# Patient Record
Sex: Male | Born: 1980 | Race: White | Hispanic: No | Marital: Single | Smoking: Current every day smoker
Health system: Southern US, Community
[De-identification: ages and names within clinical notes are randomized; demographics above are authoritative.]

## PROBLEM LIST (undated history)

## (undated) DIAGNOSIS — I10 Essential (primary) hypertension: Secondary | ICD-10-CM

## (undated) DIAGNOSIS — D509 Iron deficiency anemia, unspecified: Secondary | ICD-10-CM

## (undated) DIAGNOSIS — K863 Pseudocyst of pancreas: Secondary | ICD-10-CM

## (undated) DIAGNOSIS — K859 Acute pancreatitis without necrosis or infection, unspecified: Secondary | ICD-10-CM

## (undated) DIAGNOSIS — F17200 Nicotine dependence, unspecified, uncomplicated: Secondary | ICD-10-CM

## (undated) HISTORY — DX: Iron deficiency anemia, unspecified: D50.9

## (undated) HISTORY — DX: Nicotine dependence, unspecified, uncomplicated: F17.200

## (undated) HISTORY — DX: Essential (primary) hypertension: I10

## (undated) HISTORY — DX: Pseudocyst of pancreas: K86.3

## (undated) HISTORY — DX: Acute pancreatitis without necrosis or infection, unspecified: K85.90

---
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
## 2001-05-21 ENCOUNTER — Ambulatory Visit (HOSPITAL_BASED_OUTPATIENT_CLINIC_OR_DEPARTMENT_OTHER): Admission: RE | Admit: 2001-05-21 | Discharge: 2001-05-21 | Payer: Self-pay | Admitting: *Deleted

## 2001-08-27 ENCOUNTER — Ambulatory Visit (HOSPITAL_BASED_OUTPATIENT_CLINIC_OR_DEPARTMENT_OTHER): Admission: RE | Admit: 2001-08-27 | Discharge: 2001-08-27 | Payer: Self-pay | Admitting: *Deleted

## 2005-04-04 ENCOUNTER — Emergency Department (HOSPITAL_COMMUNITY): Admission: EM | Admit: 2005-04-04 | Discharge: 2005-04-04 | Payer: Self-pay | Admitting: Emergency Medicine

## 2018-12-19 ENCOUNTER — Encounter (HOSPITAL_COMMUNITY): Payer: Self-pay

## 2018-12-19 ENCOUNTER — Emergency Department (HOSPITAL_COMMUNITY): Payer: 59

## 2018-12-19 ENCOUNTER — Inpatient Hospital Stay (HOSPITAL_COMMUNITY)
Admission: EM | Admit: 2018-12-19 | Discharge: 2018-12-24 | DRG: 131 | Disposition: A | Discharge status: Home / Self Care | Payer: 59 | Attending: Internal Medicine | Admitting: Internal Medicine

## 2018-12-19 ENCOUNTER — Other Ambulatory Visit: Payer: Self-pay

## 2018-12-19 DIAGNOSIS — I160 Hypertensive urgency: Secondary | ICD-10-CM | POA: Diagnosis present

## 2018-12-19 DIAGNOSIS — F10239 Alcohol dependence with withdrawal, unspecified: Secondary | ICD-10-CM

## 2018-12-19 DIAGNOSIS — Z1159 Encounter for screening for other viral diseases: Secondary | ICD-10-CM

## 2018-12-19 DIAGNOSIS — Z720 Tobacco use: Secondary | ICD-10-CM

## 2018-12-19 DIAGNOSIS — I10 Essential (primary) hypertension: Secondary | ICD-10-CM | POA: Diagnosis present

## 2018-12-19 DIAGNOSIS — M272 Inflammatory conditions of jaws: Secondary | ICD-10-CM | POA: Diagnosis not present

## 2018-12-19 DIAGNOSIS — E860 Dehydration: Secondary | ICD-10-CM | POA: Diagnosis present

## 2018-12-19 DIAGNOSIS — S02609A Fracture of mandible, unspecified, initial encounter for closed fracture: Secondary | ICD-10-CM | POA: Diagnosis not present

## 2018-12-19 DIAGNOSIS — S0181XA Laceration without foreign body of other part of head, initial encounter: Secondary | ICD-10-CM | POA: Diagnosis present

## 2018-12-19 DIAGNOSIS — R9431 Abnormal electrocardiogram [ECG] [EKG]: Secondary | ICD-10-CM | POA: Diagnosis present

## 2018-12-19 DIAGNOSIS — F10230 Alcohol dependence with withdrawal, uncomplicated: Secondary | ICD-10-CM

## 2018-12-19 DIAGNOSIS — F10939 Alcohol use, unspecified with withdrawal, unspecified: Secondary | ICD-10-CM

## 2018-12-19 DIAGNOSIS — S02601A Fracture of unspecified part of body of right mandible, initial encounter for closed fracture: Principal | ICD-10-CM | POA: Diagnosis present

## 2018-12-19 DIAGNOSIS — R000 Tachycardia, unspecified: Secondary | ICD-10-CM | POA: Diagnosis not present

## 2018-12-19 DIAGNOSIS — S02652A Fracture of angle of left mandible, initial encounter for closed fracture: Secondary | ICD-10-CM | POA: Diagnosis present

## 2018-12-19 DIAGNOSIS — F10930 Alcohol use, unspecified with withdrawal, uncomplicated: Secondary | ICD-10-CM

## 2018-12-19 DIAGNOSIS — F17210 Nicotine dependence, cigarettes, uncomplicated: Secondary | ICD-10-CM | POA: Diagnosis present

## 2018-12-19 DIAGNOSIS — W109XXA Fall (on) (from) unspecified stairs and steps, initial encounter: Secondary | ICD-10-CM | POA: Diagnosis present

## 2018-12-19 DIAGNOSIS — R0600 Dyspnea, unspecified: Secondary | ICD-10-CM

## 2018-12-19 HISTORY — DX: Alcohol use, unspecified with withdrawal, unspecified: F10.939

## 2018-12-19 LAB — URINALYSIS, ROUTINE W REFLEX MICROSCOPIC
Bilirubin Urine: NEGATIVE
Glucose, UA: NEGATIVE mg/dL
Hgb urine dipstick: NEGATIVE
Ketones, ur: 80 mg/dL — AB
Nitrite: NEGATIVE
Protein, ur: 30 mg/dL — AB
Specific Gravity, Urine: 1.024 (ref 1.005–1.030)
pH: 5 (ref 5.0–8.0)

## 2018-12-19 LAB — COMPREHENSIVE METABOLIC PANEL
ALT: 79 U/L — ABNORMAL HIGH (ref 0–44)
AST: 57 U/L — ABNORMAL HIGH (ref 15–41)
Albumin: 4.3 g/dL (ref 3.5–5.0)
Alkaline Phosphatase: 66 U/L (ref 38–126)
Anion gap: 16 — ABNORMAL HIGH (ref 5–15)
BUN: 6 mg/dL (ref 6–20)
CO2: 20 mmol/L — ABNORMAL LOW (ref 22–32)
Calcium: 9.8 mg/dL (ref 8.9–10.3)
Chloride: 103 mmol/L (ref 98–111)
Creatinine, Ser: 0.98 mg/dL (ref 0.61–1.24)
GFR calc Af Amer: >60 mL/min (ref >60)
GFR calc non Af Amer: >60 mL/min (ref >60)
Glucose, Bld: 132 mg/dL — ABNORMAL HIGH (ref 70–99)
Potassium: 3.6 mmol/L (ref 3.5–5.1)
Sodium: 139 mmol/L (ref 135–145)
Total Bilirubin: 1.5 mg/dL — ABNORMAL HIGH (ref 0.3–1.2)
Total Protein: 7.8 g/dL (ref 6.5–8.1)

## 2018-12-19 LAB — CBC WITH DIFFERENTIAL/PLATELET
Abs Immature Granulocytes: 0 10*3/uL (ref 0.00–0.07)
Basophils Absolute: 0.2 10*3/uL — ABNORMAL HIGH (ref 0.0–0.1)
Basophils Relative: 1 %
Eosinophils Absolute: 0 10*3/uL (ref 0.0–0.5)
Eosinophils Relative: 0 %
HCT: 50.3 % (ref 39.0–52.0)
Hemoglobin: 17.7 g/dL — ABNORMAL HIGH (ref 13.0–17.0)
Lymphocytes Relative: 6 %
Lymphs Abs: 1.1 10*3/uL (ref 0.7–4.0)
MCH: 33.9 pg (ref 26.0–34.0)
MCHC: 35.2 g/dL (ref 30.0–36.0)
MCV: 96.4 fL (ref 80.0–100.0)
Monocytes Absolute: 0.7 10*3/uL (ref 0.1–1.0)
Monocytes Relative: 4 %
Neutro Abs: 16.5 10*3/uL — ABNORMAL HIGH (ref 1.7–7.7)
Neutrophils Relative %: 89 %
Platelets: 203 10*3/uL (ref 150–400)
RBC: 5.22 MIL/uL (ref 4.22–5.81)
RDW: 12.7 % (ref 11.5–15.5)
WBC: 18.5 10*3/uL — ABNORMAL HIGH (ref 4.0–10.5)
nRBC: 0 % (ref 0.0–0.2)
nRBC: 0 /100 WBC

## 2018-12-19 LAB — RAPID URINE DRUG SCREEN, HOSP PERFORMED
Amphetamines: NOT DETECTED
Barbiturates: NOT DETECTED
Benzodiazepines: NOT DETECTED
Cocaine: NOT DETECTED
Opiates: NOT DETECTED
Tetrahydrocannabinol: POSITIVE — AB

## 2018-12-19 LAB — ETHANOL: Alcohol, Ethyl (B): <10 mg/dL (ref <10)

## 2018-12-19 LAB — PROTIME-INR
INR: 1 (ref 0.8–1.2)
Prothrombin Time: 12.8 seconds (ref 11.4–15.2)

## 2018-12-19 LAB — SARS CORONAVIRUS 2 BY RT PCR (HOSPITAL ORDER, PERFORMED IN ~~LOC~~ HOSPITAL LAB): SARS Coronavirus 2: NEGATIVE

## 2018-12-19 MED ORDER — SODIUM CHLORIDE 0.9 % IV SOLN: INTRAVENOUS | Status: DC

## 2018-12-19 MED ORDER — LORAZEPAM 2 MG/ML IJ SOLN
2.0000 mg | INTRAMUSCULAR | Status: DC | PRN
  Administered 2018-12-20 – 2018-12-23 (×10): 2 mg via INTRAVENOUS
  Filled 2018-12-19 (×10): qty 1

## 2018-12-19 MED ORDER — ACETAMINOPHEN 650 MG RE SUPP
650.0000 mg | Freq: Four times a day (QID) | RECTAL | Status: DC | PRN
  Administered 2018-12-23: 05:00:00 650 mg via RECTAL
  Filled 2018-12-19: qty 1

## 2018-12-19 MED ORDER — ACETAMINOPHEN 325 MG PO TABS: 650.0000 mg | ORAL_TABLET | Freq: Four times a day (QID) | ORAL | Status: DC | PRN

## 2018-12-19 MED ORDER — SODIUM CHLORIDE 0.9 % IV SOLN
INTRAVENOUS | Status: AC
  Administered 2018-12-20 (×2): via INTRAVENOUS

## 2018-12-19 MED ORDER — MORPHINE SULFATE (PF) 2 MG/ML IV SOLN
2.0000 mg | INTRAVENOUS | Status: DC | PRN
  Administered 2018-12-20: 4 mg via INTRAVENOUS
  Administered 2018-12-20: 2 mg via INTRAVENOUS
  Administered 2018-12-20 (×4): 4 mg via INTRAVENOUS
  Administered 2018-12-21 – 2018-12-22 (×4): 2 mg via INTRAVENOUS
  Administered 2018-12-22: 4 mg via INTRAVENOUS
  Administered 2018-12-22 – 2018-12-24 (×4): 2 mg via INTRAVENOUS
  Filled 2018-12-19: qty 2
  Filled 2018-12-19 (×4): qty 1
  Filled 2018-12-19: qty 2
  Filled 2018-12-19 (×2): qty 1
  Filled 2018-12-19 (×2): qty 2
  Filled 2018-12-19 (×2): qty 1
  Filled 2018-12-19 (×2): qty 2
  Filled 2018-12-19: qty 1

## 2018-12-19 MED ORDER — ONDANSETRON HCL 4 MG/2ML IJ SOLN: 4.0000 mg | Freq: Four times a day (QID) | INTRAMUSCULAR | Status: DC | PRN

## 2018-12-19 MED ORDER — LORAZEPAM 2 MG/ML IJ SOLN
2.0000 mg | Freq: Once | INTRAMUSCULAR | Status: AC
  Administered 2018-12-19: 2 mg via INTRAVENOUS
  Filled 2018-12-19: qty 1

## 2018-12-19 MED ORDER — LORAZEPAM 2 MG/ML IJ SOLN
1.0000 mg | Freq: Once | INTRAMUSCULAR | Status: AC
  Administered 2018-12-19: 1 mg via INTRAVENOUS
  Filled 2018-12-19: qty 1

## 2018-12-19 MED ORDER — MAGNESIUM SULFATE IN D5W 1-5 GM/100ML-% IV SOLN
1.0000 g | Freq: Once | INTRAVENOUS | Status: AC
  Administered 2018-12-20: 1 g via INTRAVENOUS
  Filled 2018-12-19: qty 100

## 2018-12-19 MED ORDER — ENALAPRILAT 1.25 MG/ML IV SOLN
1.2500 mg | Freq: Once | INTRAVENOUS | Status: AC
  Administered 2018-12-20: 1.25 mg via INTRAVENOUS
  Filled 2018-12-19: qty 1

## 2018-12-19 MED ORDER — POTASSIUM CHLORIDE 10 MEQ/100ML IV SOLN
10.0000 meq | INTRAVENOUS | Status: AC
  Administered 2018-12-20 (×2): 10 meq via INTRAVENOUS
  Filled 2018-12-19: qty 100

## 2018-12-19 MED ORDER — ONDANSETRON HCL 4 MG PO TABS: 4.0000 mg | ORAL_TABLET | Freq: Four times a day (QID) | ORAL | Status: DC | PRN

## 2018-12-19 MED ORDER — THIAMINE HCL 100 MG/ML IJ SOLN
100.0000 mg | Freq: Every day | INTRAMUSCULAR | Status: DC
  Administered 2018-12-20 – 2018-12-24 (×5): 100 mg via INTRAVENOUS
  Filled 2018-12-19 (×5): qty 2

## 2018-12-19 MED ORDER — FAMOTIDINE IN NACL 20-0.9 MG/50ML-% IV SOLN: 20.0000 mg | Freq: Two times a day (BID) | INTRAVENOUS | Status: DC

## 2018-12-19 MED ORDER — FENTANYL CITRATE (PF) 100 MCG/2ML IJ SOLN
100.0000 ug | Freq: Once | INTRAMUSCULAR | Status: AC
  Administered 2018-12-19: 100 ug via INTRAVENOUS
  Filled 2018-12-19: qty 2

## 2018-12-19 MED ORDER — FAMOTIDINE IN NACL 20-0.9 MG/50ML-% IV SOLN
20.0000 mg | Freq: Two times a day (BID) | INTRAVENOUS | Status: AC
  Administered 2018-12-20 (×3): 20 mg via INTRAVENOUS
  Filled 2018-12-19 (×4): qty 50

## 2018-12-19 MED ORDER — LORAZEPAM 2 MG/ML IJ SOLN
1.0000 mg | Freq: Once | INTRAMUSCULAR | Status: AC
  Administered 2018-12-19: 17:00:00 1 mg via INTRAVENOUS
  Filled 2018-12-19: qty 1

## 2018-12-19 MED ORDER — ONDANSETRON HCL 4 MG/2ML IJ SOLN
4.0000 mg | Freq: Once | INTRAMUSCULAR | Status: AC
  Administered 2018-12-19: 4 mg via INTRAVENOUS
  Filled 2018-12-19: qty 2

## 2018-12-19 MED ORDER — SODIUM CHLORIDE 0.9 % IV BOLUS
1000.0000 mL | Freq: Once | INTRAVENOUS | Status: AC
  Administered 2018-12-19: 1000 mL via INTRAVENOUS

## 2018-12-19 MED ORDER — SODIUM CHLORIDE 0.9% FLUSH
3.0000 mL | Freq: Two times a day (BID) | INTRAVENOUS | Status: DC
  Administered 2018-12-19 – 2018-12-24 (×7): 3 mL via INTRAVENOUS

## 2018-12-19 MED ORDER — FOLIC ACID 5 MG/ML IJ SOLN
1.0000 mg | Freq: Every day | INTRAMUSCULAR | Status: DC
  Administered 2018-12-20 – 2018-12-24 (×5): 1 mg via INTRAVENOUS
  Filled 2018-12-19 (×7): qty 0.2

## 2018-12-19 MED ORDER — THIAMINE HCL 100 MG/ML IJ SOLN
Freq: Once | INTRAVENOUS | Status: AC
  Administered 2018-12-19: 20:00:00 via INTRAVENOUS
  Filled 2018-12-19: qty 1000

## 2018-12-19 MED ORDER — LABETALOL HCL 5 MG/ML IV SOLN
10.0000 mg | INTRAVENOUS | Status: DC | PRN
  Administered 2018-12-19 – 2018-12-24 (×7): 10 mg via INTRAVENOUS
  Filled 2018-12-19 (×6): qty 4

## 2018-12-19 NOTE — ED Triage Notes (Signed)
Came in POV; reported fall last night after drinking alcohol; approx 7-8 steps; pt denies LOC; reported went to dentist earlier today and has some broken jaws. Reported consumes about 1 pint whiskey daily for the last 2 years; last alcohol intake around 10pm last night.

## 2018-12-19 NOTE — ED Notes (Signed)
ED TO INPATIENT HANDOFF REPORT  ED Nurse Name and Phone #:  [phone]  S Name/Age/Gender Juan Crawford 38 y.o. male Room/Bed: 036C/036C  Code Status   Code Status: Not on file  Home/SNF/Other Home Patient oriented to: self, place, time and situation Is this baseline? Yes   Triage Complete: Triage complete  Chief Complaint Fall, Jaw pain  Triage Note Came in POV; reported fall last night after drinking alcohol; approx 7-8 steps; pt denies LOC; reported went to dentist earlier today and has some broken jaws. Reported consumes about 1 pint whiskey daily for the last 2 years; last alcohol intake around 10pm last night.    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 No pertinent past medical history. Past Surgical History:  Procedure Laterality Date  . HAND SURGERY  2001     A IV Location/Drains/Wounds Patient Lines/Drains/Airways Status   Active Line/Drains/Airways    Name:   Placement date:   Placement time:   Site:   Days:   Peripheral IV 12/19/18 Right Forearm   12/19/18    1654    Forearm   less than 1          Intake/Output Last 24 hours No intake or output data in the 24 hours ending 12/19/18 2045  Labs/Imaging Results for orders placed or performed during the hospital encounter of 12/19/18 (from the past 48 hour(s))  Comprehensive  metabolic panel     Status: Abnormal   Collection Time: 12/19/18  4:18 PM  Result Value Ref Range   Sodium 139 135 - 145 mmol/L   Potassium 3.6 3.5 - 5.1 mmol/L   Chloride 103 98 - 111 mmol/L   CO2 20 (L) 22 - 32 mmol/L   Glucose, Bld 132 (H) 70 - 99 mg/dL   BUN 6 6 - 20 mg/dL   Creatinine, Ser 9.56 0.61 - 1.24 mg/dL   Calcium 9.8 8.9 - 21.3 mg/dL   Total Protein 7.8 6.5 - 8.1 g/dL   Albumin 4.3 3.5 - 5.0 g/dL   AST 57 (H) 15 - 41 U/L   ALT 79 (H) 0 - 44 U/L   Alkaline Phosphatase 66 38 - 126 U/L   Total Bilirubin 1.5 (H) 0.3 - 1.2 mg/dL   GFR calc non Af Amer >60 >60 mL/min   GFR calc Af Amer >60 >60 mL/min   Anion gap 16 (H) 5 - 15    Comment: Performed at [facility], [address], [place], [place] [address]  Ethanol     Status: None   Collection Time: 12/19/18  4:18 PM  Result Value Ref Range   Alcohol, Ethyl (B) <10 <10 mg/dL    Comment: (NOTE) Lowest detectable limit for serum alcohol is 10 mg/dL. For medical purposes only. Performed at [facility], [address] [place], [place] [address]   CBC with Differential     Status:  Abnormal   Collection Time: 12/19/18  4:18 PM  Result Value Ref Range   WBC 18.5 (H) 4.0 - 10.5 K/uL   RBC 5.22 4.22 - 5.81 MIL/uL   Hemoglobin 17.7 (H) 13.0 - 17.0 g/dL   HCT 16.1 09.6 - 04.5 %   MCV 96.4 80.0 - 100.0 fL   MCH 33.9 26.0 - 34.0 pg   MCHC 35.2 30.0 - 36.0 g/dL   RDW 40.9 81.1 - 91.4 %   Platelets 203 150 - 400 K/uL   nRBC 0.0 0.0 - 0.2 %   Neutrophils Relative % 89 %   Neutro Abs 16.5 (H) 1.7 - 7.7 K/uL   Lymphocytes Relative 6 %   Lymphs Abs 1.1 0.7 - 4.0 K/uL   Monocytes Relative 4 %   Monocytes Absolute 0.7 0.1 - 1.0 K/uL   Eosinophils Relative 0 %   Eosinophils Absolute 0.0 0.0 - 0.5 K/uL   Basophils Relative 1 %   Basophils Absolute 0.2 (H) 0.0 - 0.1 K/uL   nRBC 0 0 /100 WBC   Abs Immature Granulocytes 0.00 0.00 - 0.07 K/uL    Comment: Performed at [facility], [address],  [place], [place] [address]  Protime-INR     Status: None   Collection Time: 12/19/18  4:18 PM  Result Value Ref Range   Prothrombin Time 12.8 11.4 - 15.2 seconds   INR 1.0 0.8 - 1.2    Comment: (NOTE) INR goal varies based on device and disease states. Performed at [facility], [address] [place], [place] [address]   SARS Coronavirus 2 (CEPHEID - Performed in [facility] hospital lab), Hosp Order     Status: None   Collection Time: 12/19/18  4:27 PM  Result Value Ref Range   SARS Coronavirus 2 NEGATIVE NEGATIVE    Comment: (NOTE) If result is NEGATIVE SARS-CoV-2 target nucleic acids are NOT DETECTED. The SARS-CoV-2 RNA is generally detectable in upper and lower  respiratory specimens during the acute phase of infection. The lowest  concentration of SARS-CoV-2 viral copies this assay can detect is 250  copies / mL. A negative result does not preclude SARS-CoV-2 infection  and should not be used as the sole basis for treatment or other  patient management decisions.  A negative result may occur with  improper specimen collection / handling, submission of specimen other  than nasopharyngeal swab, presence of viral mutation(s) within the  areas targeted by this assay, and inadequate number of viral copies  (<250 copies / mL). A negative result must be combined with clinical  observations, patient history, and epidemiological information. If result is POSITIVE SARS-CoV-2 target nucleic acids are DETECTED. The SARS-CoV-2 RNA is generally detectable in upper and lower  respiratory specimens dur ing the acute phase of infection.  Positive  results are indicative of active infection with SARS-CoV-2.  Clinical  correlation with patient history and other diagnostic information is  necessary to determine patient infection status.  Positive results do  not rule out bacterial infection or co-infection with other viruses. If result is PRESUMPTIVE POSTIVE SARS-CoV-2 nucleic acids MAY BE  PRESENT.   A presumptive positive result was obtained on the submitted specimen  and confirmed on repeat testing.  While 2019 novel coronavirus  (SARS-CoV-2) nucleic acids may be present in the submitted sample  additional confirmatory testing may be necessary for epidemiological  and / or clinical management purposes  to differentiate between  SARS-CoV-2 and other Sarbecovirus currently known  to infect humans.  If clinically indicated additional testing with an alternate test  methodology [phone]) is advised. The SARS-CoV-2 RNA is generally  detectable in upper and lower respiratory sp ecimens during the acute  phase of infection. The expected result is Negative. Fact Sheet for Patients:  BoilerBrush.com.cy Fact Sheet for Healthcare Providers: https://pope.com/ This test is not yet approved or cleared by the [place] FDA and has been authorized for detection and/or diagnosis of SARS-CoV-2 by FDA under an Emergency Use Authorization (EUA).  This EUA will remain in effect (meaning this test can be used) for the duration of the COVID-19 declaration under Section 564(b)(1) of the Act, 21 U.S.C. section 360bbb-3(b)(1), unless the authorization is terminated or revoked sooner. Performed at [facility], [address], [place], [place] [address]   Urine rapid drug screen (hosp performed)     Status: Abnormal   Collection Time: 12/19/18  6:50 PM  Result Value Ref Range   Opiates NONE DETECTED NONE DETECTED   Cocaine NONE DETECTED NONE DETECTED   Benzodiazepines NONE DETECTED NONE DETECTED   Amphetamines NONE DETECTED NONE DETECTED   Tetrahydrocannabinol POSITIVE (A) NONE DETECTED   Barbiturates NONE DETECTED NONE DETECTED    Comment: (NOTE) DRUG SCREEN FOR MEDICAL PURPOSES ONLY.  IF CONFIRMATION IS NEEDED FOR ANY PURPOSE, NOTIFY LAB WITHIN 5 DAYS. LOWEST DETECTABLE LIMITS FOR URINE DRUG SCREEN Drug Class                      Cutoff (ng/mL) Amphetamine and metabolites    1000 Barbiturate and metabolites    200 Benzodiazepine                 200 Tricyclics and metabolites     300 Opiates and metabolites        300 Cocaine and metabolites        300 THC                            50 Performed at [facility], [address] [place], [place] [address]   Urinalysis, Routine w reflex microscopic     Status: Abnormal   Collection Time: 12/19/18  6:55 PM  Result Value Ref Range   Color, Urine AMBER (A) YELLOW    Comment: BIOCHEMICALS MAY BE AFFECTED BY COLOR   APPearance HAZY (A) CLEAR   Specific Gravity, Urine 1.024 1.005 - 1.030   pH 5.0 5.0 - 8.0   Glucose, UA NEGATIVE NEGATIVE mg/dL   Hgb urine dipstick NEGATIVE NEGATIVE   Bilirubin Urine NEGATIVE NEGATIVE   Ketones, ur 80 (A) NEGATIVE mg/dL   Protein, ur 30 (A) NEGATIVE mg/dL   Nitrite NEGATIVE NEGATIVE   Leukocytes,Ua TRACE (A) NEGATIVE   RBC / HPF 0-5 0 - 5 RBC/hpf   WBC, UA 11-20 0 - 5 WBC/hpf   Bacteria, UA RARE (A) NONE SEEN   Squamous Epithelial / LPF 0-5 0 - 5   Mucus PRESENT    Hyaline Casts, UA PRESENT     Comment: Performed at [facility], [address], [place], [place] [address]   Ct Head Wo Contrast  Result Date: 12/19/2018 CLINICAL DATA:  Fall down stairs. EXAM: CT HEAD WITHOUT CONTRAST CT MAXILLOFACIAL WITHOUT CONTRAST CT CERVICAL SPINE WITHOUT CONTRAST TECHNIQUE: Multidetector CT imaging of the head, cervical spine, and maxillofacial structures were performed using the standard protocol without intravenous contrast. Multiplanar CT image reconstructions  of the cervical spine and maxillofacial structures were also generated. COMPARISON:  None. FINDINGS: CT HEAD FINDINGS Brain: There is no mass, hemorrhage or extra-axial collection. The size and configuration of the ventricles and extra-axial CSF spaces are normal. The brain parenchyma is normal, without evidence of acute or chronic infarction. Vascular: No abnormal  hyperdensity of the major intracranial arteries or dural venous sinuses. No intracranial atherosclerosis. Skull: The visualized skull base, calvarium and extracranial soft tissues are normal. CT MAXILLOFACIAL FINDINGS Osseous: --Complex facial fracture types: No LeFort, zygomaticomaxillary complex or nasoorbitoethmoidal fracture. --Simple fracture types: None. --Mandible: Comminuted fracture of the mandible traversing the right mandibular body at the root of tooth 27 and the left mandibular angle across the roots of teeth 16 and 17. Temporal mandibular joints remain approximated. Orbits: The globes are intact. Normal appearance of the intra- and extraconal fat. Symmetric extraocular muscles and optic nerves. Sinuses: No fluid levels or advanced mucosal thickening. Soft tissues: Hematoma surrounding the left mandible with left facial abrasion. CT CERVICAL SPINE FINDINGS Alignment: No static subluxation. Facets are aligned. Occipital condyles and the lateral masses of C1-C2 are aligned. Skull base and vertebrae: No acute fracture. Soft tissues and spinal canal: No prevertebral fluid or swelling. No visible canal hematoma. Disc levels: No advanced spinal canal or neural foraminal stenosis. Upper chest: No pneumothorax, pulmonary nodule or pleural effusion. Other: Normal visualized paraspinal cervical soft tissues. IMPRESSION: 1. No acute intracranial abnormality. 2. Comminuted mandibular fracture extending from the right mandibular body to the left mandibular angle and involving the roots of teeth 16, 17 in 27. 3. No fracture or static subluxation of the cervical spine. Electronically Signed   By: Deatra RobinsonKevin  Herman M.D.   On: 12/19/2018 19:21   Ct Cervical Spine Wo Contrast  Result Date: 12/19/2018 CLINICAL DATA:  Fall down stairs. EXAM: CT HEAD WITHOUT CONTRAST CT MAXILLOFACIAL WITHOUT CONTRAST CT CERVICAL SPINE WITHOUT CONTRAST TECHNIQUE: Multidetector CT imaging of the head, cervical spine, and maxillofacial  structures were performed using the standard protocol without intravenous contrast. Multiplanar CT image reconstructions of the cervical spine and maxillofacial structures were also generated. COMPARISON:  None. FINDINGS: CT HEAD FINDINGS Brain: There is no mass, hemorrhage or extra-axial collection. The size and configuration of the ventricles and extra-axial CSF spaces are normal. The brain parenchyma is normal, without evidence of acute or chronic infarction. Vascular: No abnormal hyperdensity of the major intracranial arteries or dural venous sinuses. No intracranial atherosclerosis. Skull: The visualized skull base, calvarium and extracranial soft tissues are normal. CT MAXILLOFACIAL FINDINGS Osseous: --Complex facial fracture types: No LeFort, zygomaticomaxillary complex or nasoorbitoethmoidal fracture. --Simple fracture types: None. --Mandible: Comminuted fracture of the mandible traversing the right mandibular body at the root of tooth 27 and the left mandibular angle across the roots of teeth 16 and 17. Temporal mandibular joints remain approximated. Orbits: The globes are intact. Normal appearance of the intra- and extraconal fat. Symmetric extraocular muscles and optic nerves. Sinuses: No fluid levels or advanced mucosal thickening. Soft tissues: Hematoma surrounding the left mandible with left facial abrasion. CT CERVICAL SPINE FINDINGS Alignment: No static subluxation. Facets are aligned. Occipital condyles and the lateral masses of C1-C2 are aligned. Skull base and vertebrae: No acute fracture. Soft tissues and spinal canal: No prevertebral fluid or swelling. No visible canal hematoma. Disc levels: No advanced spinal canal or neural foraminal stenosis. Upper chest: No pneumothorax, pulmonary nodule or pleural effusion. Other: Normal visualized paraspinal cervical soft tissues. IMPRESSION: 1. No acute intracranial abnormality. 2. Comminuted mandibular  fracture extending from the right mandibular body to  the left mandibular angle and involving the roots of teeth 16, 17 in 27. 3. No fracture or static subluxation of the cervical spine. Electronically Signed   By: Deatra Robinson M.D.   On: 12/19/2018 19:21   Ct Maxillofacial Wo Cm  Result Date: 12/19/2018 CLINICAL DATA:  Fall down stairs. EXAM: CT HEAD WITHOUT CONTRAST CT MAXILLOFACIAL WITHOUT CONTRAST CT CERVICAL SPINE WITHOUT CONTRAST TECHNIQUE: Multidetector CT imaging of the head, cervical spine, and maxillofacial structures were performed using the standard protocol without intravenous contrast. Multiplanar CT image reconstructions of the cervical spine and maxillofacial structures were also generated. COMPARISON:  None. FINDINGS: CT HEAD FINDINGS Brain: There is no mass, hemorrhage or extra-axial collection. The size and configuration of the ventricles and extra-axial CSF spaces are normal. The brain parenchyma is normal, without evidence of acute or chronic infarction. Vascular: No abnormal hyperdensity of the major intracranial arteries or dural venous sinuses. No intracranial atherosclerosis. Skull: The visualized skull base, calvarium and extracranial soft tissues are normal. CT MAXILLOFACIAL FINDINGS Osseous: --Complex facial fracture types: No LeFort, zygomaticomaxillary complex or nasoorbitoethmoidal fracture. --Simple fracture types: None. --Mandible: Comminuted fracture of the mandible traversing the right mandibular body at the root of tooth 27 and the left mandibular angle across the roots of teeth 16 and 17. Temporal mandibular joints remain approximated. Orbits: The globes are intact. Normal appearance of the intra- and extraconal fat. Symmetric extraocular muscles and optic nerves. Sinuses: No fluid levels or advanced mucosal thickening. Soft tissues: Hematoma surrounding the left mandible with left facial abrasion. CT CERVICAL SPINE FINDINGS Alignment: No static subluxation. Facets are aligned. Occipital condyles and the lateral masses of C1-C2  are aligned. Skull base and vertebrae: No acute fracture. Soft tissues and spinal canal: No prevertebral fluid or swelling. No visible canal hematoma. Disc levels: No advanced spinal canal or neural foraminal stenosis. Upper chest: No pneumothorax, pulmonary nodule or pleural effusion. Other: Normal visualized paraspinal cervical soft tissues. IMPRESSION: 1. No acute intracranial abnormality. 2. Comminuted mandibular fracture extending from the right mandibular body to the left mandibular angle and involving the roots of teeth 16, 17 in 27. 3. No fracture or static subluxation of the cervical spine. Electronically Signed   By: Deatra Robinson M.D.   On: 12/19/2018 19:21    Pending Labs Unresulted Labs (From admission, onward)   None      Vitals/Pain Today's Vitals   12/19/18 1945 12/19/18 1950 12/19/18 1950 12/19/18 2023  BP: (!) 191/118     Pulse: (!) 134     Resp: 19     Temp:      TempSrc:      SpO2: 95%     Weight:      Height:      PainSc:  5  6  0-No pain    Isolation Precautions No active isolations  Medications Medications  0.9 %  sodium chloride infusion ( Intravenous New Bag/Given 12/19/18 1941)  LORazepam (ATIVAN) injection 2 mg (has no administration in time range)  sodium chloride 0.9 % bolus 1,000 mL (1,000 mLs Intravenous New Bag/Given 12/19/18 1654)  LORazepam (ATIVAN) injection 1 mg (1 mg Intravenous Given 12/19/18 1655)  ondansetron (ZOFRAN) injection 4 mg (4 mg Intravenous Given 12/19/18 1655)  fentaNYL (SUBLIMAZE) injection 100 mcg (100 mcg Intravenous Given 12/19/18 1655)  sodium chloride 0.9 % 1,000 mL with thiamine 100 mg, folic acid 1 mg, multivitamins adult 10 mL infusion ( Intravenous New Bag/Given 12/19/18 1942)  LORazepam (ATIVAN) injection 1 mg (1 mg Intravenous Given 12/19/18 1939)    Mobility walks Moderate fall risk   Focused Assessments Cardiac Assessment Handoff:  Cardiac Rhythm: Sinus tachycardia No results found for: CKTOTAL, CKMB, CKMBINDEX,  TROPONINI No results found for: DDIMER Does the Patient currently have chest pain? No      R Recommendations: See Admitting Provider Note  Report given to:   Additional Notes:

## 2018-12-19 NOTE — H&P (Addendum)
History and Physical    MARQUEZ CEESAY WJX:[id number] DOB: 1980/10/20 DOA: 12/19/2018  PCP: Patient, No Pcp Per   Patient coming from: Home   Chief Complaint: Fall with jaw injury   HPI: Juan Crawford is a 38 y.o. male with medical history significant for alcoholism, now presenting to the emergency department for evaluation of severe jaw pain after a fall last night.  Patient reports that he was drinking last night, fell down some stairs, did not lose consciousness, but has been experiencing severe pain around his jaw.  He saw a dentist today for evaluation of this, radiographs demonstrated mandibular fracture, he was then directed to an oral surgeon who sent him to the ED.  Patient reports that he consumes approximately 1 pint of whiskey every night, has been able to maintain employment and does not typically develop any significant withdrawal symptoms during the day, but has been drinking every night for the past 2 years.  His last drink was approximately 10 PM.  He denies any recent fevers or chills, denies chest pain or palpitations, and denies any cough or shortness of breath.  Reports that he was in his usual state before the fall.  ED Course: Upon arrival to the ED, patient is found to be afebrile, saturating mid 90s on room air, slightly tachypneic, tachycardic in the 160s, and hypertensive to 190/120.  EKG features sinus tachycardia with rate 163 and QTc interval of 506 ms.  Chemistry panel is notable for slight elevation in transaminases and bilirubin and CBC features a leukocytosis to 18,500.  INR is normal and urinalysis notable for ketonuria.  Patient was given a liter of normal saline, banana bag, IV Ativan, and fentanyl in the ED.  ENT was consulted by the ED physician and recommended medical admission.  Review of Systems:  All other systems reviewed and apart from HPI, are negative.  History reviewed. No pertinent past medical history.  Past Surgical History:  Procedure  Laterality Date   HAND SURGERY  2001     reports that he has been smoking cigarettes. He has a 20.00 pack-year smoking history. He has never used smokeless tobacco. He reports current alcohol use of about 6.0 standard drinks of alcohol per week. He reports current drug use. Frequency: 3.00 times per week. Drug: Marijuana.  No Known Allergies  Family History  Family history unknown: Yes     Prior to Admission medications   Not on File    Physical Exam: Vitals:   12/19/18 1900 12/19/18 1915 12/19/18 1930 12/19/18 1945  BP: (!) 175/126 (!) 170/113 (!) 178/123 (!) 191/118  Pulse: (!) 139 (!) 137 (!) 127 (!) 134  Resp: (!) 23 (!) 24 (!) 22 19  Temp:      TempSrc:      SpO2: 95% 94% 93% 95%  Weight:      Height:        Constitutional: NAD, tremulous  Eyes: PERTLA, lids and conjunctivae normal ENMT: Mucous membranes are moist. Posterior pharynx clear of any exudate or lesions.   Neck: normal, supple, no masses, no thyromegaly Respiratory: clear to auscultation bilaterally, no wheezing, no crackles. No accessory muscle use.  Cardiovascular: Rate ~120 and regular. No extremity edema.   Abdomen: No distension, no tenderness, soft. Bowel sounds active.  Musculoskeletal: no clubbing / cyanosis. No joint deformity upper and lower extremities.   Skin: no significant rashes, lesions, ulcers. Warm, dry, well-perfused. Neurologic: CN 2-12 grossly intact. Sensation intact. Tremulous. Strength 5/5 in all 4  limbs.  Psychiatric: Alert and oriented to person, place, and situation. Tremulous, cooperative.    Labs on Admission: I have personally reviewed following labs and imaging studies  CBC: Recent Labs  Lab 12/19/18 1618  WBC 18.5*  NEUTROABS 16.5*  HGB 17.7*  HCT 50.3  MCV 96.4  PLT 203   Basic Metabolic Panel: Recent Labs  Lab 12/19/18 1618  NA 139  K 3.6  CL 103  CO2 20*  GLUCOSE 132*  BUN 6  CREATININE 0.98  CALCIUM 9.8   GFR: Estimated Creatinine Clearance:  117.8 mL/min (by C-G formula based on SCr of 0.98 mg/dL). Liver Function Tests: Recent Labs  Lab 12/19/18 1618  AST 57*  ALT 79*  ALKPHOS 66  BILITOT 1.5*  PROT 7.8  ALBUMIN 4.3   No results for input(s): LIPASE, AMYLASE in the last 168 hours. No results for input(s): AMMONIA in the last 168 hours. Coagulation Profile: Recent Labs  Lab 12/19/18 1618  INR 1.0   Cardiac Enzymes: No results for input(s): CKTOTAL, CKMB, CKMBINDEX, TROPONINI in the last 168 hours. BNP (last 3 results) No results for input(s): PROBNP in the last 8760 hours. HbA1C: No results for input(s): HGBA1C in the last 72 hours. CBG: No results for input(s): GLUCAP in the last 168 hours. Lipid Profile: No results for input(s): CHOL, HDL, LDLCALC, TRIG, CHOLHDL, LDLDIRECT in the last 72 hours. Thyroid Function Tests: No results for input(s): TSH, T4TOTAL, FREET4, T3FREE, THYROIDAB in the last 72 hours. Anemia Panel: No results for input(s): VITAMINB12, FOLATE, FERRITIN, TIBC, IRON, RETICCTPCT in the last 72 hours. Urine analysis:    Component Value Date/Time   COLORURINE AMBER (A) 12/19/2018 1855   APPEARANCEUR HAZY (A) 12/19/2018 1855   LABSPEC 1.024 12/19/2018 1855   PHURINE 5.0 12/19/2018 1855   GLUCOSEU NEGATIVE 12/19/2018 1855   HGBUR NEGATIVE 12/19/2018 1855   BILIRUBINUR NEGATIVE 12/19/2018 1855   KETONESUR 80 (A) 12/19/2018 1855   PROTEINUR 30 (A) 12/19/2018 1855   NITRITE NEGATIVE 12/19/2018 1855   LEUKOCYTESUR TRACE (A) 12/19/2018 1855   Sepsis Labs: (procalcitonin:4,lacticidven:4) ) Recent Results (from the past 240 hour(s))  SARS Coronavirus 2 (CEPHEID - Performed in [facility] hospital lab), Hosp Order     Status: None   Collection Time: 12/19/18  4:27 PM  Result Value Ref Range Status   SARS Coronavirus 2 NEGATIVE NEGATIVE Final    Comment: (NOTE) If result is NEGATIVE SARS-CoV-2 target nucleic acids are NOT DETECTED. The SARS-CoV-2 RNA is generally detectable in  upper and lower  respiratory specimens during the acute phase of infection. The lowest  concentration of SARS-CoV-2 viral copies this assay can detect is 250  copies / mL. A negative result does not preclude SARS-CoV-2 infection  and should not be used as the sole basis for treatment or other  patient management decisions.  A negative result may occur with  improper specimen collection / handling, submission of specimen other  than nasopharyngeal swab, presence of viral mutation(s) within the  areas targeted by this assay, and inadequate number of viral copies  (<250 copies / mL). A negative result must be combined with clinical  observations, patient history, and epidemiological information. If result is POSITIVE SARS-CoV-2 target nucleic acids are DETECTED. The SARS-CoV-2 RNA is generally detectable in upper and lower  respiratory specimens dur ing the acute phase of infection.  Positive  results are indicative of active infection with SARS-CoV-2.  Clinical  correlation with patient history and other diagnostic information is  necessary  to determine patient infection status.  Positive results do  not rule out bacterial infection or co-infection with other viruses. If result is PRESUMPTIVE POSTIVE SARS-CoV-2 nucleic acids MAY BE PRESENT.   A presumptive positive result was obtained on the submitted specimen  and confirmed on repeat testing.  While 2019 novel coronavirus  (SARS-CoV-2) nucleic acids may be present in the submitted sample  additional confirmatory testing may be necessary for epidemiological  and / or clinical management purposes  to differentiate between  SARS-CoV-2 and other Sarbecovirus currently known to infect humans.  If clinically indicated additional testing with an alternate test  methodology [phone]([id number]) is advised. The SARS-CoV-2 RNA is generally  detectable in upper and lower respiratory sp ecimens during the acute  phase of infection. The expected result is  Negative. Fact Sheet for Patients:  BoilerBrush.com.cyhttps://www.fda.gov/media/136312/download Fact Sheet for Healthcare Providers: https://pope.com/https://www.fda.gov/media/136313/download This test is not yet approved or cleared by the [place] FDA and has been authorized for detection and/or diagnosis of SARS-CoV-2 by FDA under an Emergency Use Authorization (EUA).  This EUA will remain in effect (meaning this test can be used) for the duration of the COVID-19 declaration under Section 564(b)(1) of the Act, 21 U.S.C. section 360bbb-3(b)(1), unless the authorization is terminated or revoked sooner. Performed at [facility], [address] [place], [place] [address]      Radiological Exams on Admission: Ct Head Wo Contrast  Result Date: 12/19/2018 CLINICAL DATA:  Fall down stairs. EXAM: CT HEAD WITHOUT CONTRAST CT MAXILLOFACIAL WITHOUT CONTRAST CT CERVICAL SPINE WITHOUT CONTRAST TECHNIQUE: Multidetector CT imaging of the head, cervical spine, and maxillofacial structures were performed using the standard protocol without intravenous contrast. Multiplanar CT image reconstructions of the cervical spine and maxillofacial structures were also generated. COMPARISON:  None. FINDINGS: CT HEAD FINDINGS Brain: There is no mass, hemorrhage or extra-axial collection. The size and configuration of the ventricles and extra-axial CSF spaces are normal. The brain parenchyma is normal, without evidence of acute or chronic infarction. Vascular: No abnormal hyperdensity of the major intracranial arteries or dural venous sinuses. No intracranial atherosclerosis. Skull: The visualized skull base, calvarium and extracranial soft tissues are normal. CT MAXILLOFACIAL FINDINGS Osseous: --Complex facial fracture types: No LeFort, zygomaticomaxillary complex or nasoorbitoethmoidal fracture. --Simple fracture types: None. --Mandible: Comminuted fracture of the mandible traversing the right mandibular body at the root of tooth 27 and the left  mandibular angle across the roots of teeth 16 and 17. Temporal mandibular joints remain approximated. Orbits: The globes are intact. Normal appearance of the intra- and extraconal fat. Symmetric extraocular muscles and optic nerves. Sinuses: No fluid levels or advanced mucosal thickening. Soft tissues: Hematoma surrounding the left mandible with left facial abrasion. CT CERVICAL SPINE FINDINGS Alignment: No static subluxation. Facets are aligned. Occipital condyles and the lateral masses of C1-C2 are aligned. Skull base and vertebrae: No acute fracture. Soft tissues and spinal canal: No prevertebral fluid or swelling. No visible canal hematoma. Disc levels: No advanced spinal canal or neural foraminal stenosis. Upper chest: No pneumothorax, pulmonary nodule or pleural effusion. Other: Normal visualized paraspinal cervical soft tissues. IMPRESSION: 1. No acute intracranial abnormality. 2. Comminuted mandibular fracture extending from the right mandibular body to the left mandibular angle and involving the roots of teeth 16, 17 in 27. 3. No fracture or static subluxation of the cervical spine. Electronically Signed   By: Deatra RobinsonKevin  Herman M.D.   On: 12/19/2018 19:21   Ct Cervical Spine Wo Contrast  Result Date: 12/19/2018 CLINICAL  DATA:  Fall down stairs. EXAM: CT HEAD WITHOUT CONTRAST CT MAXILLOFACIAL WITHOUT CONTRAST CT CERVICAL SPINE WITHOUT CONTRAST TECHNIQUE: Multidetector CT imaging of the head, cervical spine, and maxillofacial structures were performed using the standard protocol without intravenous contrast. Multiplanar CT image reconstructions of the cervical spine and maxillofacial structures were also generated. COMPARISON:  None. FINDINGS: CT HEAD FINDINGS Brain: There is no mass, hemorrhage or extra-axial collection. The size and configuration of the ventricles and extra-axial CSF spaces are normal. The brain parenchyma is normal, without evidence of acute or chronic infarction. Vascular: No abnormal  hyperdensity of the major intracranial arteries or dural venous sinuses. No intracranial atherosclerosis. Skull: The visualized skull base, calvarium and extracranial soft tissues are normal. CT MAXILLOFACIAL FINDINGS Osseous: --Complex facial fracture types: No LeFort, zygomaticomaxillary complex or nasoorbitoethmoidal fracture. --Simple fracture types: None. --Mandible: Comminuted fracture of the mandible traversing the right mandibular body at the root of tooth 27 and the left mandibular angle across the roots of teeth 16 and 17. Temporal mandibular joints remain approximated. Orbits: The globes are intact. Normal appearance of the intra- and extraconal fat. Symmetric extraocular muscles and optic nerves. Sinuses: No fluid levels or advanced mucosal thickening. Soft tissues: Hematoma surrounding the left mandible with left facial abrasion. CT CERVICAL SPINE FINDINGS Alignment: No static subluxation. Facets are aligned. Occipital condyles and the lateral masses of C1-C2 are aligned. Skull base and vertebrae: No acute fracture. Soft tissues and spinal canal: No prevertebral fluid or swelling. No visible canal hematoma. Disc levels: No advanced spinal canal or neural foraminal stenosis. Upper chest: No pneumothorax, pulmonary nodule or pleural effusion. Other: Normal visualized paraspinal cervical soft tissues. IMPRESSION: 1. No acute intracranial abnormality. 2. Comminuted mandibular fracture extending from the right mandibular body to the left mandibular angle and involving the roots of teeth 16, 17 in 27. 3. No fracture or static subluxation of the cervical spine. Electronically Signed   By: Deatra Robinson M.D.   On: 12/19/2018 19:21   Ct Maxillofacial Wo Cm  Result Date: 12/19/2018 CLINICAL DATA:  Fall down stairs. EXAM: CT HEAD WITHOUT CONTRAST CT MAXILLOFACIAL WITHOUT CONTRAST CT CERVICAL SPINE WITHOUT CONTRAST TECHNIQUE: Multidetector CT imaging of the head, cervical spine, and maxillofacial structures  were performed using the standard protocol without intravenous contrast. Multiplanar CT image reconstructions of the cervical spine and maxillofacial structures were also generated. COMPARISON:  None. FINDINGS: CT HEAD FINDINGS Brain: There is no mass, hemorrhage or extra-axial collection. The size and configuration of the ventricles and extra-axial CSF spaces are normal. The brain parenchyma is normal, without evidence of acute or chronic infarction. Vascular: No abnormal hyperdensity of the major intracranial arteries or dural venous sinuses. No intracranial atherosclerosis. Skull: The visualized skull base, calvarium and extracranial soft tissues are normal. CT MAXILLOFACIAL FINDINGS Osseous: --Complex facial fracture types: No LeFort, zygomaticomaxillary complex or nasoorbitoethmoidal fracture. --Simple fracture types: None. --Mandible: Comminuted fracture of the mandible traversing the right mandibular body at the root of tooth 27 and the left mandibular angle across the roots of teeth 16 and 17. Temporal mandibular joints remain approximated. Orbits: The globes are intact. Normal appearance of the intra- and extraconal fat. Symmetric extraocular muscles and optic nerves. Sinuses: No fluid levels or advanced mucosal thickening. Soft tissues: Hematoma surrounding the left mandible with left facial abrasion. CT CERVICAL SPINE FINDINGS Alignment: No static subluxation. Facets are aligned. Occipital condyles and the lateral masses of C1-C2 are aligned. Skull base and vertebrae: No acute fracture. Soft tissues and spinal canal: No  prevertebral fluid or swelling. No visible canal hematoma. Disc levels: No advanced spinal canal or neural foraminal stenosis. Upper chest: No pneumothorax, pulmonary nodule or pleural effusion. Other: Normal visualized paraspinal cervical soft tissues. IMPRESSION: 1. No acute intracranial abnormality. 2. Comminuted mandibular fracture extending from the right mandibular body to the left  mandibular angle and involving the roots of teeth 16, 17 in 27. 3. No fracture or static subluxation of the cervical spine. Electronically Signed   By: Deatra Robinson M.D.   On: 12/19/2018 19:21    EKG: Independently reviewed. Sinus tachycardia (rate 163), QTc 506 ms.   Assessment/Plan   1. Mandibular fracture  - Presents with mandibular fracture after a fall the night before  - ENT is consulting and much appreciated, recommending medical admission d/t acute alcohol withdrawal  - Continue pain-control, supportive care, NPO    2. Alcohol dependence with withdrawal  - Presents with jaw pain after a fall the night before and is found to be tremulous, markedly hypertensive and tachycardic  - He consumes a pint of whiskey (8.5 standard drinks) every night, last drink ~10 pm on 12/18/18  - Continue CIWA, prn Ativan, vitamin supplements, monitor electrolytes    3. Hypertensive urgency  - BP 190/120 in ED in setting of EtOH withdrawal and pain  - Continue pain-control, as-needed Ativan, and use labetalol IVP's as needed   4. Sinus tachycardia  - HR in 160's initially, improved to 130 with IVF, analgesia, and Ativan, sinus rhythm confirmed by EKG  - Suspect this is driven by pain, EtOH withdrawal, and dehydration  - There is a leukocytosis that is likely reactive given absence of fever or infectious s/s; there is no chest pain, cough, or SOB to suggest PE  - Continue cardiac monitoring, IVF hydration, pain-control, and as-needed Ativan   5. Prolonged QT - QTc slightly prolonged on admission EKG  - Replace potassium to 4 and mag to 2, minimize QT-prolonging medications, repeat EKG in am    DVT prophylaxis: SCD's  Code Status: Full  Family Communication: Discussed with patient  Consults called: ENT  Admission status: Observation     Briscoe Deutscher, MD [facility] Pager [phone]  If 7PM-7AM, please contact night-coverage [email or web address] Password [email or web address]  12/19/2018, 9:00 PM

## 2018-12-19 NOTE — ED Provider Notes (Signed)
[facility]   CSN: [id number] Arrival date & time: 12/19/18  1533    History   Chief Complaint Chief Complaint  Patient presents with   Jaw Pain   Fall   Tachycardia    HPI Juan Crawford is a 38 y.o. male.     HPI   Patient injured his face last night when he was walking down some steps and fell, after drinking.  Today he went to a dentist, who did a Panorex and told him he had a mandible fracture.  He was sent to oral surgery, who referred him to the ED.  Came to the ED by private vehicle.  States he drinks alcohol daily but typically can through most of the day prior to drinking.  He continues to hold down a job.  At this time he feels uncomfortable with facial pain, and somewhat nervous.  He denies recent illnesses including fever, vomiting, cough, chest pain, weakness or dizziness.  He states that he is a daily drinker.  He has never been in DTs.  There are no other known modifying factors.  History reviewed. No pertinent past medical history.  There are no active problems to display for this patient.   Past Surgical History:  Procedure Laterality Date   HAND SURGERY  2001        Home Medications    Prior to Admission medications   Not on File    Family History Family History  Family history unknown: Yes    Social History Social History   Tobacco Use   Smoking status: Current Every Day Smoker    Packs/day: 1.00    Years: 20.00    Pack years: 20.00    Types: Cigarettes   Smokeless tobacco: Never Used  Substance Use Topics   Alcohol use: Yes    Alcohol/week: 6.0 standard drinks    Types: 6 Shots of liquor per week   Drug use: Yes    Frequency: 3.0 times per week    Types: Marijuana     Allergies   Patient has no known allergies.   Review of Systems Review of Systems  All other systems reviewed and are negative.    Physical Exam Updated Vital Signs BP (!) 196/115     Pulse (!) 135    Temp 98.5 F (36.9 C) (Oral)    Resp 20    Ht 6\' 1"  (1.854 m)    Wt 88.5 kg    SpO2 98%    BMI 25.73 kg/m   Physical Exam Vitals signs and nursing note reviewed.  Constitutional:      Appearance: He is well-developed.  HENT:     Head: Normocephalic.     Comments: Right facial swelling, and superficial contusion without associated crepitation or midface instability.  Decreased motion, mandible secondary to pain with right-sided deformity evident.  No evident intraoral or external cheek lesions.    Right Ear: External ear normal.     Left Ear: External ear normal.     Mouth/Throat:     Pharynx: No oropharyngeal exudate or posterior oropharyngeal erythema.  Eyes:     Conjunctiva/sclera: Conjunctivae normal.     Pupils: Pupils are equal, round, and reactive to light.  Neck:     Musculoskeletal: Normal range of motion and neck supple.     Trachea: Phonation normal.  Cardiovascular:     Rate and Rhythm: Normal rate and regular rhythm.     Heart sounds: Normal  heart sounds.  Pulmonary:     Effort: Pulmonary effort is normal.     Breath sounds: Normal breath sounds.  Abdominal:     General: There is no distension.     Palpations: Abdomen is soft.     Tenderness: There is no abdominal tenderness. There is no guarding.  Musculoskeletal: Normal range of motion.  Skin:    General: Skin is warm and dry.  Neurological:     Mental Status: He is alert and oriented to person, place, and time.     Cranial Nerves: No cranial nerve deficit.     Sensory: No sensory deficit.     Motor: No weakness or abnormal muscle tone.     Coordination: Coordination normal.  Psychiatric:        Mood and Affect: Mood normal.        Behavior: Behavior normal.        Thought Content: Thought content normal.        Judgment: Judgment normal.      ED Treatments / Results  Labs (all labs ordered are listed, but only abnormal results are displayed) Labs Reviewed  COMPREHENSIVE METABOLIC  PANEL - Abnormal; Notable for the following components:      Result Value   CO2 20 (*)    Glucose, Bld 132 (*)    AST 57 (*)    ALT 79 (*)    Total Bilirubin 1.5 (*)    Anion gap 16 (*)    All other components within normal limits  CBC WITH DIFFERENTIAL/PLATELET - Abnormal; Notable for the following components:   WBC 18.5 (*)    Hemoglobin 17.7 (*)    Neutro Abs 16.5 (*)    Basophils Absolute 0.2 (*)    All other components within normal limits  SARS CORONAVIRUS 2 (HOSPITAL ORDER, PERFORMED IN [facility] LAB)  ETHANOL  PROTIME-INR  RAPID URINE DRUG SCREEN, HOSP PERFORMED  URINALYSIS, ROUTINE W REFLEX MICROSCOPIC    EKG EKG Interpretation  Date/Time:  Friday Dec 19 2018 15:50:48 EDT Ventricular Rate:  163 PR Interval:    QRS Duration: 82 QT Interval:  307 QTC Calculation: 506 R Axis:   99 Text Interpretation:  Sinus tachycardia Borderline right axis deviation Borderline repolarization abnormality Prolonged QT interval Baseline wander in lead(s) I III aVL No old tracing to compare Confirmed by Mancel Bale [phone]) on 12/19/2018 6:11:41 PM    Radiology No results found.  Procedures .Critical Care Performed by: Mancel Bale, MD Authorized by: Mancel Bale, MD   Critical care provider statement:    Critical care time (minutes):  50   Critical care start time:  12/19/2018 3:40 PM   Critical care end time:  12/19/2018 7:15 PM   Critical care time was exclusive of:  Separately billable procedures and treating other patients   Critical care was necessary to treat or prevent imminent or life-threatening deterioration of the following conditions:  Cardiac failure   Critical care was time spent personally by me on the following activities:  Blood draw for specimens, development of treatment plan with patient or surrogate, discussions with consultants, evaluation of patient's response to treatment, examination of patient, obtaining history from patient or surrogate,  ordering and performing treatments and interventions, ordering and review of laboratory studies, pulse oximetry, re-evaluation of patient's condition, review of old charts and ordering and review of radiographic studies   (including critical care time)  Medications Ordered in ED Medications  0.9 %  sodium chloride infusion (has no  administration in time range)  sodium chloride 0.9 % bolus 1,000 mL (1,000 mLs Intravenous New Bag/Given 12/19/18 1654)  LORazepam (ATIVAN) injection 1 mg (1 mg Intravenous Given 12/19/18 1655)  ondansetron (ZOFRAN) injection 4 mg (4 mg Intravenous Given 12/19/18 1655)  fentaNYL (SUBLIMAZE) injection 100 mcg (100 mcg Intravenous Given 12/19/18 1655)     Initial Impression / Assessment and Plan / ED Course  I have reviewed the triage vital signs and the nursing notes.  Pertinent labs & imaging results that were available during my care of the patient were reviewed by me and considered in my medical decision making (see chart for details).  Clinical Course as of Dec 19 949  Sat Dec 20, 2018  0948 Normal except white count high, platelets low  CBC WITH DIFFERENTIAL(!) [EW]  0949 Normal except potassium low, glucose high, BUN low, calcium low, total protein low, albumin low, ALT low, total bilirubin high  Comprehensive metabolic panel(!) [EW]  0949 Abnormal, THC present  Urine rapid drug screen (hosp performed)(!) [EW]  0949 Normal  SARS Coronavirus 2 (CEPHEID - Performed in [facility] hospital lab), [facility] Order [EW]  [phone] Normal  Ethanol [EW]  0949 Except ketones, protein and leukocytes present.  Also few WBCs and rare bacteria  Urinalysis, Routine w reflex microscopic(!) [EW]  0950 Normal  Protime-INR [EW]  0950 CT images reviewed.  Head CT and cervical spine without fracture or injury.  Maxillofacial images indicate complicated mandibular fracture.   [EW]    Clinical Course User Index [EW] Mancel Bale, MD        Patient Vitals for the past 24  hrs:  BP Temp Temp src Pulse Resp SpO2 Height Weight  12/19/18 1745 (!) 196/115 -- -- (!) 135 20 98 % -- --  12/19/18 1730 (!) 180/118 -- -- (!) 136 (!) 21 94 % -- --  12/19/18 1715 (!) 175/120 -- -- (!) 152 (!) 23 97 % -- --  12/19/18 1700 (!) 191/124 -- -- (!) 146 17 96 % -- --  12/19/18 1645 (!) 189/135 -- -- (!) 158 (!) 24 98 % -- --  12/19/18 1630 (!) 180/124 -- -- (!) 162 (!) 25 94 % -- --  12/19/18 1615 (!) 172/127 -- -- (!) 161 (!) 24 97 % -- --  12/19/18 1555 -- -- -- -- -- --  (1.854 m) 88.5 kg  12/19/18 1547 (!) 169/127 98.5 F (36.9 C) Oral (!) 168 18 96 % -- --    7:12 PM Reevaluation with update and discussion. After initial assessment and treatment, an updated evaluation reveals he remains fairly comfortable and interactive.  He continues to be tachycardic, and hypertensive.  Patient updated on findings and plan. Mancel Bale   Medical Decision Making: Fall, day prior, presenting with mandibular fracture.  Comprehensive evaluation done for additional injuries, reassuring without findings for intracranial or cervical injury.  Persistent tachycardia with hypotension felt to be related to alcohol withdrawal syndrome.  No history of delirium tremens.  Patient responded partially to IV fluid bolus.  He will require hospitalization for stabilization and likely surgical repair of mandibular fracture.  Doubt metabolic instability or impending vascular collapse.  CRITICAL CARE- yes Performed by: Mancel Bale  Nursing Notes Reviewed/ Care Coordinated Applicable Imaging Reviewed Interpretation of Laboratory Data incorporated into ED treatment  Plan: Admit   Final Clinical Impressions(s) / ED Diagnoses   Final diagnoses:  Closed fracture of jaw, initial encounter (HCC)  Tachycardia  Alcohol withdrawal syndrome without complication (HCC)  ED Discharge Orders    None       Mancel Bale, MD 12/20/18 [phone]

## 2018-12-19 NOTE — ED Provider Notes (Addendum)
Care assumed from Dr. Effie Shy at shift change, please see their notes for full documentation of patient's complaint/HPI. Briefly, pt here with fall yesterday resulting in ?jaw fracture, was sent here by Dr. Shea Evans of oral surgery. Results so far show COVID19 neg, CMP with bicarb 20, anion gap 16, mildly elevated AST/ALT/Tbili; EtOH level undetectable, CBC w/diff with WBC 18.5 but hemoconcentrated, INR WNL. Awaiting head/neck/maxillofacial CT. Plan is to likely admit for his persistent tachycardia and likely alcohol withdrawal as well as f/up on CTs.   Additional hx: pt denies SI/HI/AVH, reports drinking alcohol frequently. Denies CP, SOB, abd pain or any other complaints aside from jaw pain. Reports his teeth don't feel like they line up the same as they used to. Has been NPO all day today. Doesn't have a PCP.   Physical Exam  BP (!) 191/118   Pulse (!) 134   Temp 98.5 F (36.9 C) (Oral)   Resp 19   Ht  (1.854 m)   Wt 88.5 kg   SpO2 95%   BMI 25.73 kg/m   Physical Exam Gen: afebrile, hypertensive and tachycardic, but in NAD HEENT: EOMI, dry lips, malocclusion noted, diffuse jaw swelling bilaterally Resp: no resp distress, SpO2 95% on RA during exam CV: tachycardic in the 130s Abd: appearance normal, nondistended MsK: moving all extremities with ease, slightly tremulous Neuro: A&O  ED Course/Procedures     Results for orders placed or performed during the hospital encounter of 12/19/18  SARS Coronavirus 2 (CEPHEID - Performed in [facility] hospital lab), [facility] Order  Result Value Ref Range   SARS Coronavirus 2 NEGATIVE NEGATIVE  Comprehensive metabolic panel  Result Value Ref Range   Sodium 139 135 - 145 mmol/L   Potassium 3.6 3.5 - 5.1 mmol/L   Chloride 103 98 - 111 mmol/L   CO2 20 (L) 22 - 32 mmol/L   Glucose, Bld 132 (H) 70 - 99 mg/dL   BUN 6 6 - 20 mg/dL   Creatinine, Ser 4.25 0.61 - 1.24 mg/dL   Calcium 9.8 8.9 - 95.6 mg/dL   Total Protein 7.8 6.5 - 8.1 g/dL    Albumin 4.3 3.5 - 5.0 g/dL   AST 57 (H) 15 - 41 U/L   ALT 79 (H) 0 - 44 U/L   Alkaline Phosphatase 66 38 - 126 U/L   Total Bilirubin 1.5 (H) 0.3 - 1.2 mg/dL   GFR calc non Af Amer >60 >60 mL/min   GFR calc Af Amer >60 >60 mL/min   Anion gap 16 (H) 5 - 15  Ethanol  Result Value Ref Range   Alcohol, Ethyl (B) <10 <10 mg/dL  CBC with Differential  Result Value Ref Range   WBC 18.5 (H) 4.0 - 10.5 K/uL   RBC 5.22 4.22 - 5.81 MIL/uL   Hemoglobin 17.7 (H) 13.0 - 17.0 g/dL   HCT 38.7 56.4 - 33.2 %   MCV 96.4 80.0 - 100.0 fL   MCH 33.9 26.0 - 34.0 pg   MCHC 35.2 30.0 - 36.0 g/dL   RDW 95.1 88.4 - 16.6 %   Platelets 203 150 - 400 K/uL   nRBC 0.0 0.0 - 0.2 %   Neutrophils Relative % 89 %   Neutro Abs 16.5 (H) 1.7 - 7.7 K/uL   Lymphocytes Relative 6 %   Lymphs Abs 1.1 0.7 - 4.0 K/uL   Monocytes Relative 4 %   Monocytes Absolute 0.7 0.1 - 1.0 K/uL   Eosinophils Relative 0 %   Eosinophils  Absolute 0.0 0.0 - 0.5 K/uL   Basophils Relative 1 %   Basophils Absolute 0.2 (H) 0.0 - 0.1 K/uL   nRBC 0 0 /100 WBC   Abs Immature Granulocytes 0.00 0.00 - 0.07 K/uL  Urinalysis, Routine w reflex microscopic  Result Value Ref Range   Color, Urine AMBER (A) YELLOW   APPearance HAZY (A) CLEAR   Specific Gravity, Urine 1.024 1.005 - 1.030   pH 5.0 5.0 - 8.0   Glucose, UA NEGATIVE NEGATIVE mg/dL   Hgb urine dipstick NEGATIVE NEGATIVE   Bilirubin Urine NEGATIVE NEGATIVE   Ketones, ur 80 (A) NEGATIVE mg/dL   Protein, ur 30 (A) NEGATIVE mg/dL   Nitrite NEGATIVE NEGATIVE   Leukocytes,Ua TRACE (A) NEGATIVE   RBC / HPF 0-5 0 - 5 RBC/hpf   WBC, UA 11-20 0 - 5 WBC/hpf   Bacteria, UA RARE (A) NONE SEEN   Squamous Epithelial / LPF 0-5 0 - 5   Mucus PRESENT    Hyaline Casts, UA PRESENT   Protime-INR  Result Value Ref Range   Prothrombin Time 12.8 11.4 - 15.2 seconds   INR 1.0 0.8 - 1.2   Ct Head Wo Contrast  Result Date: 12/19/2018 CLINICAL DATA:  Fall down stairs. EXAM: CT HEAD WITHOUT CONTRAST CT  MAXILLOFACIAL WITHOUT CONTRAST CT CERVICAL SPINE WITHOUT CONTRAST TECHNIQUE: Multidetector CT imaging of the head, cervical spine, and maxillofacial structures were performed using the standard protocol without intravenous contrast. Multiplanar CT image reconstructions of the cervical spine and maxillofacial structures were also generated. COMPARISON:  None. FINDINGS: CT HEAD FINDINGS Brain: There is no mass, hemorrhage or extra-axial collection. The size and configuration of the ventricles and extra-axial CSF spaces are normal. The brain parenchyma is normal, without evidence of acute or chronic infarction. Vascular: No abnormal hyperdensity of the major intracranial arteries or dural venous sinuses. No intracranial atherosclerosis. Skull: The visualized skull base, calvarium and extracranial soft tissues are normal. CT MAXILLOFACIAL FINDINGS Osseous: --Complex facial fracture types: No LeFort, zygomaticomaxillary complex or nasoorbitoethmoidal fracture. --Simple fracture types: None. --Mandible: Comminuted fracture of the mandible traversing the right mandibular body at the root of tooth 27 and the left mandibular angle across the roots of teeth 16 and 17. Temporal mandibular joints remain approximated. Orbits: The globes are intact. Normal appearance of the intra- and extraconal fat. Symmetric extraocular muscles and optic nerves. Sinuses: No fluid levels or advanced mucosal thickening. Soft tissues: Hematoma surrounding the left mandible with left facial abrasion. CT CERVICAL SPINE FINDINGS Alignment: No static subluxation. Facets are aligned. Occipital condyles and the lateral masses of C1-C2 are aligned. Skull base and vertebrae: No acute fracture. Soft tissues and spinal canal: No prevertebral fluid or swelling. No visible canal hematoma. Disc levels: No advanced spinal canal or neural foraminal stenosis. Upper chest: No pneumothorax, pulmonary nodule or pleural effusion. Other: Normal visualized paraspinal  cervical soft tissues. IMPRESSION: 1. No acute intracranial abnormality. 2. Comminuted mandibular fracture extending from the right mandibular body to the left mandibular angle and involving the roots of teeth 16, 17 in 27. 3. No fracture or static subluxation of the cervical spine. Electronically Signed   By: Deatra Robinson M.D.   On: 12/19/2018 19:21   Ct Cervical Spine Wo Contrast  Result Date: 12/19/2018 CLINICAL DATA:  Fall down stairs. EXAM: CT HEAD WITHOUT CONTRAST CT MAXILLOFACIAL WITHOUT CONTRAST CT CERVICAL SPINE WITHOUT CONTRAST TECHNIQUE: Multidetector CT imaging of the head, cervical spine, and maxillofacial structures were performed using the standard protocol without intravenous  contrast. Multiplanar CT image reconstructions of the cervical spine and maxillofacial structures were also generated. COMPARISON:  None. FINDINGS: CT HEAD FINDINGS Brain: There is no mass, hemorrhage or extra-axial collection. The size and configuration of the ventricles and extra-axial CSF spaces are normal. The brain parenchyma is normal, without evidence of acute or chronic infarction. Vascular: No abnormal hyperdensity of the major intracranial arteries or dural venous sinuses. No intracranial atherosclerosis. Skull: The visualized skull base, calvarium and extracranial soft tissues are normal. CT MAXILLOFACIAL FINDINGS Osseous: --Complex facial fracture types: No LeFort, zygomaticomaxillary complex or nasoorbitoethmoidal fracture. --Simple fracture types: None. --Mandible: Comminuted fracture of the mandible traversing the right mandibular body at the root of tooth 27 and the left mandibular angle across the roots of teeth 16 and 17. Temporal mandibular joints remain approximated. Orbits: The globes are intact. Normal appearance of the intra- and extraconal fat. Symmetric extraocular muscles and optic nerves. Sinuses: No fluid levels or advanced mucosal thickening. Soft tissues: Hematoma surrounding the left mandible  with left facial abrasion. CT CERVICAL SPINE FINDINGS Alignment: No static subluxation. Facets are aligned. Occipital condyles and the lateral masses of C1-C2 are aligned. Skull base and vertebrae: No acute fracture. Soft tissues and spinal canal: No prevertebral fluid or swelling. No visible canal hematoma. Disc levels: No advanced spinal canal or neural foraminal stenosis. Upper chest: No pneumothorax, pulmonary nodule or pleural effusion. Other: Normal visualized paraspinal cervical soft tissues. IMPRESSION: 1. No acute intracranial abnormality. 2. Comminuted mandibular fracture extending from the right mandibular body to the left mandibular angle and involving the roots of teeth 16, 17 in 27. 3. No fracture or static subluxation of the cervical spine. Electronically Signed   By: Deatra RobinsonKevin  Herman M.D.   On: 12/19/2018 19:21   Ct Maxillofacial Wo Cm  Result Date: 12/19/2018 CLINICAL DATA:  Fall down stairs. EXAM: CT HEAD WITHOUT CONTRAST CT MAXILLOFACIAL WITHOUT CONTRAST CT CERVICAL SPINE WITHOUT CONTRAST TECHNIQUE: Multidetector CT imaging of the head, cervical spine, and maxillofacial structures were performed using the standard protocol without intravenous contrast. Multiplanar CT image reconstructions of the cervical spine and maxillofacial structures were also generated. COMPARISON:  None. FINDINGS: CT HEAD FINDINGS Brain: There is no mass, hemorrhage or extra-axial collection. The size and configuration of the ventricles and extra-axial CSF spaces are normal. The brain parenchyma is normal, without evidence of acute or chronic infarction. Vascular: No abnormal hyperdensity of the major intracranial arteries or dural venous sinuses. No intracranial atherosclerosis. Skull: The visualized skull base, calvarium and extracranial soft tissues are normal. CT MAXILLOFACIAL FINDINGS Osseous: --Complex facial fracture types: No LeFort, zygomaticomaxillary complex or nasoorbitoethmoidal fracture. --Simple fracture  types: None. --Mandible: Comminuted fracture of the mandible traversing the right mandibular body at the root of tooth 27 and the left mandibular angle across the roots of teeth 16 and 17. Temporal mandibular joints remain approximated. Orbits: The globes are intact. Normal appearance of the intra- and extraconal fat. Symmetric extraocular muscles and optic nerves. Sinuses: No fluid levels or advanced mucosal thickening. Soft tissues: Hematoma surrounding the left mandible with left facial abrasion. CT CERVICAL SPINE FINDINGS Alignment: No static subluxation. Facets are aligned. Occipital condyles and the lateral masses of C1-C2 are aligned. Skull base and vertebrae: No acute fracture. Soft tissues and spinal canal: No prevertebral fluid or swelling. No visible canal hematoma. Disc levels: No advanced spinal canal or neural foraminal stenosis. Upper chest: No pneumothorax, pulmonary nodule or pleural effusion. Other: Normal visualized paraspinal cervical soft tissues. IMPRESSION: 1. No acute intracranial  abnormality. 2. Comminuted mandibular fracture extending from the right mandibular body to the left mandibular angle and involving the roots of teeth 16, 17 in 27. 3. No fracture or static subluxation of the cervical spine. Electronically Signed   By: Deatra Robinson M.D.   On: 12/19/2018 19:21     Meds ordered this encounter  Medications  . sodium chloride 0.9 % bolus 1,000 mL  . 0.9 %  sodium chloride infusion  . LORazepam (ATIVAN) injection 1 mg  . ondansetron (ZOFRAN) injection 4 mg  . fentaNYL (SUBLIMAZE) injection 100 mcg  . sodium chloride 0.9 % 1,000 mL with thiamine 100 mg, folic acid 1 mg, multivitamins adult 10 mL infusion  . LORazepam (ATIVAN) injection 1 mg  . LORazepam (ATIVAN) injection 2 mg     MDM:   ICD-10-CM   1. Closed fracture of jaw, initial encounter (HCC) S02.609A   2. Tachycardia R00.0   3. Alcohol withdrawal syndrome without complication (HCC) F10.230     7:57 PM CT  head/face/Cspine showing no acute intracranial abnormality, comminuted mandibular fx extending from right mandibular body to left mandibular angle involving roots of teeth 16, 17, and 27; no fracture or static subluxation of Cspine. U/A without evidence of UTI. UDS with +THC. Pt still tachycardic in the 130s despite fluids and ativan. Appears tremulous, admits to chronic alcohol use, will likely need admission for EtOH withdrawals and persistent tachycardia. He denies having any CP, SOB, abd pain or other symptoms. Will speak with ENT regarding mandibular fx and then likely admit.   8:08 PM Dr. Jenne Pane of ENT returning page, will work on ENT fracture side of things but would like medicine admission for his tachycardia and likely alcohol withdrawal. He will likely take him to the OR for his fractures. Keep NPO for now. Will consult for admission.   8:31 PM Dr. Antionette Char of [facility] returning page and will admit. Holding orders to be placed by admitting team. Please see their notes for further documentation of care. I appreciate their help with this pleasant pt's care. Pt stable at time of admission.     [address] [place], [place] 12/19/18 2031   ADDENDUM 8:54 PM: Dr. Jenne Pane calling back, states anesthesia wants his heart rate to improve before taking to OR, will defer to hospitalist staff to work on that, but Dr. Jenne Pane planning to not operate tonight and will plan for tentative repair tomorrow. Please see his notes for further documentation of plan of care.     [address], [place], [place] 12/19/18 2055    Mancel Bale, MD 12/20/18 [phone]

## 2018-12-20 DIAGNOSIS — R509 Fever, unspecified: Secondary | ICD-10-CM | POA: Diagnosis not present

## 2018-12-20 DIAGNOSIS — S0181XA Laceration without foreign body of other part of head, initial encounter: Secondary | ICD-10-CM | POA: Diagnosis present

## 2018-12-20 DIAGNOSIS — S02652A Fracture of angle of left mandible, initial encounter for closed fracture: Secondary | ICD-10-CM | POA: Diagnosis present

## 2018-12-20 DIAGNOSIS — I160 Hypertensive urgency: Secondary | ICD-10-CM | POA: Diagnosis present

## 2018-12-20 DIAGNOSIS — I10 Essential (primary) hypertension: Secondary | ICD-10-CM | POA: Diagnosis present

## 2018-12-20 DIAGNOSIS — S02601A Fracture of unspecified part of body of right mandible, initial encounter for closed fracture: Secondary | ICD-10-CM | POA: Diagnosis present

## 2018-12-20 DIAGNOSIS — W109XXA Fall (on) (from) unspecified stairs and steps, initial encounter: Secondary | ICD-10-CM | POA: Diagnosis present

## 2018-12-20 DIAGNOSIS — F10230 Alcohol dependence with withdrawal, uncomplicated: Secondary | ICD-10-CM | POA: Diagnosis present

## 2018-12-20 DIAGNOSIS — Z1159 Encounter for screening for other viral diseases: Secondary | ICD-10-CM | POA: Diagnosis not present

## 2018-12-20 DIAGNOSIS — M272 Inflammatory conditions of jaws: Secondary | ICD-10-CM | POA: Diagnosis not present

## 2018-12-20 DIAGNOSIS — F17210 Nicotine dependence, cigarettes, uncomplicated: Secondary | ICD-10-CM | POA: Diagnosis present

## 2018-12-20 DIAGNOSIS — F10239 Alcohol dependence with withdrawal, unspecified: Secondary | ICD-10-CM | POA: Diagnosis not present

## 2018-12-20 DIAGNOSIS — E860 Dehydration: Secondary | ICD-10-CM | POA: Diagnosis present

## 2018-12-20 DIAGNOSIS — S02609A Fracture of mandible, unspecified, initial encounter for closed fracture: Secondary | ICD-10-CM | POA: Diagnosis not present

## 2018-12-20 DIAGNOSIS — R000 Tachycardia, unspecified: Secondary | ICD-10-CM | POA: Diagnosis present

## 2018-12-20 DIAGNOSIS — Z720 Tobacco use: Secondary | ICD-10-CM | POA: Diagnosis not present

## 2018-12-20 DIAGNOSIS — R9431 Abnormal electrocardiogram [ECG] [EKG]: Secondary | ICD-10-CM | POA: Diagnosis present

## 2018-12-20 LAB — COMPREHENSIVE METABOLIC PANEL
ALT: 56 U/L — ABNORMAL HIGH (ref 0–44)
AST: 34 U/L (ref 15–41)
Albumin: 3.4 g/dL — ABNORMAL LOW (ref 3.5–5.0)
Alkaline Phosphatase: 54 U/L (ref 38–126)
Anion gap: 10 (ref 5–15)
BUN: 5 mg/dL — ABNORMAL LOW (ref 6–20)
CO2: 22 mmol/L (ref 22–32)
Calcium: 8.7 mg/dL — ABNORMAL LOW (ref 8.9–10.3)
Chloride: 103 mmol/L (ref 98–111)
Creatinine, Ser: 0.83 mg/dL (ref 0.61–1.24)
GFR calc Af Amer: >60 mL/min (ref >60)
GFR calc non Af Amer: >60 mL/min (ref >60)
Glucose, Bld: 117 mg/dL — ABNORMAL HIGH (ref 70–99)
Potassium: 3.4 mmol/L — ABNORMAL LOW (ref 3.5–5.1)
Sodium: 135 mmol/L (ref 135–145)
Total Bilirubin: 1.6 mg/dL — ABNORMAL HIGH (ref 0.3–1.2)
Total Protein: 6.2 g/dL — ABNORMAL LOW (ref 6.5–8.1)

## 2018-12-20 LAB — CBC WITH DIFFERENTIAL/PLATELET
Abs Immature Granulocytes: 0.07 10*3/uL (ref 0.00–0.07)
Basophils Absolute: 0 10*3/uL (ref 0.0–0.1)
Basophils Relative: 0 %
Eosinophils Absolute: 0 10*3/uL (ref 0.0–0.5)
Eosinophils Relative: 0 %
HCT: 41.3 % (ref 39.0–52.0)
Hemoglobin: 14.6 g/dL (ref 13.0–17.0)
Immature Granulocytes: 1 %
Lymphocytes Relative: 8 %
Lymphs Abs: 1.1 10*3/uL (ref 0.7–4.0)
MCH: 34.6 pg — ABNORMAL HIGH (ref 26.0–34.0)
MCHC: 35.4 g/dL (ref 30.0–36.0)
MCV: 97.9 fL (ref 80.0–100.0)
Monocytes Absolute: 1.2 10*3/uL — ABNORMAL HIGH (ref 0.1–1.0)
Monocytes Relative: 8 %
Neutro Abs: 11.5 10*3/uL — ABNORMAL HIGH (ref 1.7–7.7)
Neutrophils Relative %: 83 %
Platelets: 149 10*3/uL — ABNORMAL LOW (ref 150–400)
RBC: 4.22 MIL/uL (ref 4.22–5.81)
RDW: 12.8 % (ref 11.5–15.5)
WBC: 13.8 10*3/uL — ABNORMAL HIGH (ref 4.0–10.5)
nRBC: 0 % (ref 0.0–0.2)

## 2018-12-20 LAB — HIV ANTIBODY (ROUTINE TESTING W REFLEX): HIV Screen 4th Generation wRfx: NONREACTIVE

## 2018-12-20 LAB — MAGNESIUM: Magnesium: 2.1 mg/dL (ref 1.7–2.4)

## 2018-12-20 MED ORDER — HYDRALAZINE HCL 20 MG/ML IJ SOLN
10.0000 mg | INTRAMUSCULAR | Status: DC | PRN
  Administered 2018-12-21 – 2018-12-24 (×3): 10 mg via INTRAVENOUS
  Filled 2018-12-20 (×3): qty 1

## 2018-12-20 MED ORDER — HYDRALAZINE HCL 20 MG/ML IJ SOLN
10.0000 mg | Freq: Once | INTRAMUSCULAR | Status: AC
  Administered 2018-12-20: 10 mg via INTRAVENOUS
  Filled 2018-12-20: qty 1

## 2018-12-20 MED ORDER — HYDRALAZINE HCL 20 MG/ML IJ SOLN
10.0000 mg | Freq: Four times a day (QID) | INTRAMUSCULAR | Status: DC | PRN
  Administered 2018-12-20: 10 mg via INTRAVENOUS
  Filled 2018-12-20: qty 1

## 2018-12-20 MED ORDER — ENALAPRILAT 1.25 MG/ML IV SOLN
1.2500 mg | Freq: Once | INTRAVENOUS | Status: AC
  Administered 2018-12-20: 1.25 mg via INTRAVENOUS
  Filled 2018-12-20: qty 1

## 2018-12-20 MED ORDER — POTASSIUM CHLORIDE 10 MEQ/100ML IV SOLN
10.0000 meq | Freq: Once | INTRAVENOUS | Status: DC
  Filled 2018-12-20: qty 100

## 2018-12-20 MED ORDER — SODIUM CHLORIDE 0.9 % IV SOLN
INTRAVENOUS | Status: AC
  Administered 2018-12-20 (×2): via INTRAVENOUS

## 2018-12-20 MED ORDER — CLINDAMYCIN PHOSPHATE 600 MG/50ML IV SOLN
600.0000 mg | Freq: Three times a day (TID) | INTRAVENOUS | Status: AC
  Administered 2018-12-20 – 2018-12-21 (×3): 600 mg via INTRAVENOUS
  Filled 2018-12-20 (×3): qty 50

## 2018-12-20 NOTE — Progress Notes (Addendum)
F/U from night shift.   Pt a/o x4, follows commands, reports difficulty swallowing due to pain, no respiratory distress, skin warm and moist.    Pt states he drinks about a pint of whiskey a day x2 years. He has stopped drinking one time before for one day but he started feeling sick and "felt the urge" to start back. Last ETOH was Thursday night. CIWA 13. 2mg  Ativan IVP given per CIWA orders. Will continue to follow. Encouraged RN Lelon Mast to call RRT at [phone] as needed.

## 2018-12-20 NOTE — Progress Notes (Signed)
Pt. In room alert and oriented X4.  He is resting comfortably.  BP is running high since he came up to 5w.  I called on call.  So far he has gotten 2 doses of Labetalol 10mg  IV, One dose of Vasotec 1.25mg  IV,one dose of Ativan 2mg  IV and one dose of Morphine 2mg  IV.  Last BP is  168/109 MAP 125.  Will call on call again.

## 2018-12-20 NOTE — Progress Notes (Signed)
Patient ID: Charmaine DownsBradley T Colasanti, male   DOB: 02/10/1981, 38 y.o.   MRN: [id number]  PROGRESS NOTE    Charmaine DownsBradley T Smoker  BMW:[id number] DOB: 04/04/1981 DOA: 12/19/2018 PCP: Patient, No Pcp Per   Brief Narrative:  38 year old male with history of alcoholism presented on 12/19/2018 with a fall and jaw injury.  He was found to have mandibular fracture.  ENT was consulted who recommended medical admission because of features of alcohol withdrawal.  Assessment & Plan:   Principal Problem:   Alcohol withdrawal (HCC) Active Problems:   Bilateral fracture of mandible, closed, initial encounter [facility](HCC)   Hypertensive urgency  Mandibular fracture status post fall -ENT has been consulted and is planning for probable surgery today.  Continue pain management.  Continue n.p.o. -Fall precautions  Alcohol dependence with withdrawal -Currently hypertensive and tachycardic and tachypneic  -Continue CIWA protocol -Continue multivitamin, thiamine and folic acid -Counseled about alcohol cessation -We will request social worker to give resources for outpatient programs.  Hypertensive urgency -Blood pressure is still extremely elevated. -We will use PRN IV antihypertensives  Sinus tachycardia -Probably from alcohol withdrawal, dehydration and pain -Monitor.     DVT prophylaxis: SCDs Code Status: Full Family Communication: None at bedside Disposition Plan: Depends on clinical outcome Consultants: ENT  Procedures: None  Antimicrobials: None   Subjective: Patient seen and examined at bedside.  He complains of facial pain.  Feels anxious.  No overnight fever or vomiting.  Objective: Vitals:   12/19/18 2316 12/20/18 0316 12/20/18 0822 12/20/18 1012  BP:  (!) 157/111 (!) 175/110 (!) 174/90  Pulse:    (!) 130  Resp:  (!) 23 (!) 21 (!) 23  Temp: 98.2 F (36.8 C) 98 F (36.7 C)    TempSrc: Oral Oral    SpO2:      Weight:      Height:        Intake/Output Summary (Last 24 hours) at  12/20/2018 1052 Last data filed at 12/20/2018 0926 Gross per 24 hour  Intake 449.48 ml  Output 1150 ml  Net -700.52 ml   Filed Weights   12/19/18 1555 12/19/18 2224  Weight: 88.5 kg 102.1 kg    Examination:  General exam: Appears anxious.  Looks older than stated age.  Tremulous ENT/face: Face and mandible swollen Respiratory system: Bilateral decreased breath sounds at bases, tachypneic Cardiovascular system: S1 & S2 heard, tachycardic  gastrointestinal system: Abdomen is nondistended, soft and nontender. Normal bowel sounds heard. Extremities: No cyanosis, clubbing, edema    Data Reviewed: I have personally reviewed following labs and imaging studies  CBC: Recent Labs  Lab 12/19/18 1618 12/20/18 0247  WBC 18.5* 13.8*  NEUTROABS 16.5* 11.5*  HGB 17.7* 14.6  HCT 50.3 41.3  MCV 96.4 97.9  PLT 203 149*   Basic Metabolic Panel: Recent Labs  Lab 12/19/18 1618 12/20/18 0247  NA 139 135  K 3.6 3.4*  CL 103 103  CO2 20* 22  GLUCOSE 132* 117*  BUN 6 5*  CREATININE 0.98 0.83  CALCIUM 9.8 8.7*  MG  --  2.1   GFR: Estimated Creatinine Clearance: 154.5 mL/min (by C-G formula based on SCr of 0.83 mg/dL). Liver Function Tests: Recent Labs  Lab 12/19/18 1618 12/20/18 0247  AST 57* 34  ALT 79* 56*  ALKPHOS 66 54  BILITOT 1.5* 1.6*  PROT 7.8 6.2*  ALBUMIN 4.3 3.4*   No results for input(s): LIPASE, AMYLASE in the last 168 hours. No results for input(s): AMMONIA in the last  168 hours. Coagulation Profile: Recent Labs  Lab 12/19/18 1618  INR 1.0   Cardiac Enzymes: No results for input(s): CKTOTAL, CKMB, CKMBINDEX, TROPONINI in the last 168 hours. BNP (last 3 results) No results for input(s): PROBNP in the last 8760 hours. HbA1C: No results for input(s): HGBA1C in the last 72 hours. CBG: No results for input(s): GLUCAP in the last 168 hours. Lipid Profile: No results for input(s): CHOL, HDL, LDLCALC, TRIG, CHOLHDL, LDLDIRECT in the last 72 hours. Thyroid  Function Tests: No results for input(s): TSH, T4TOTAL, FREET4, T3FREE, THYROIDAB in the last 72 hours. Anemia Panel: No results for input(s): VITAMINB12, FOLATE, FERRITIN, TIBC, IRON, RETICCTPCT in the last 72 hours. Sepsis Labs: No results for input(s): PROCALCITON, LATICACIDVEN in the last 168 hours.  Recent Results (from the past 240 hour(s))  SARS Coronavirus 2 (CEPHEID - Performed in [facility] hospital lab), Hosp Order     Status: None   Collection Time: 12/19/18  4:27 PM  Result Value Ref Range Status   SARS Coronavirus 2 NEGATIVE NEGATIVE Final    Comment: (NOTE) If result is NEGATIVE SARS-CoV-2 target nucleic acids are NOT DETECTED. The SARS-CoV-2 RNA is generally detectable in upper and lower  respiratory specimens during the acute phase of infection. The lowest  concentration of SARS-CoV-2 viral copies this assay can detect is 250  copies / mL. A negative result does not preclude SARS-CoV-2 infection  and should not be used as the sole basis for treatment or other  patient management decisions.  A negative result may occur with  improper specimen collection / handling, submission of specimen other  than nasopharyngeal swab, presence of viral mutation(s) within the  areas targeted by this assay, and inadequate number of viral copies  (<250 copies / mL). A negative result must be combined with clinical  observations, patient history, and epidemiological information. If result is POSITIVE SARS-CoV-2 target nucleic acids are DETECTED. The SARS-CoV-2 RNA is generally detectable in upper and lower  respiratory specimens dur ing the acute phase of infection.  Positive  results are indicative of active infection with SARS-CoV-2.  Clinical  correlation with patient history and other diagnostic information is  necessary to determine patient infection status.  Positive results do  not rule out bacterial infection or co-infection with other viruses. If result is PRESUMPTIVE  POSTIVE SARS-CoV-2 nucleic acids MAY BE PRESENT.   A presumptive positive result was obtained on the submitted specimen  and confirmed on repeat testing.  While 2019 novel coronavirus  (SARS-CoV-2) nucleic acids may be present in the submitted sample  additional confirmatory testing may be necessary for epidemiological  and / or clinical management purposes  to differentiate between  SARS-CoV-2 and other Sarbecovirus currently known to infect humans.  If clinically indicated additional testing with an alternate test  methodology [id number]) is advised. The SARS-CoV-2 RNA is generally  detectable in upper and lower respiratory sp ecimens during the acute  phase of infection. The expected result is Negative. Fact Sheet for Patients:  BoilerBrush.com.cy Fact Sheet for Healthcare Providers: https://pope.com/ This test is not yet approved or cleared by the [place] FDA and has been authorized for detection and/or diagnosis of SARS-CoV-2 by FDA under an Emergency Use Authorization (EUA).  This EUA will remain in effect (meaning this test can be used) for the duration of the COVID-19 declaration under Section 564(b)(1) of the Act, 21 U.S.C. section 360bbb-3(b)(1), unless the authorization is terminated or revoked sooner. Performed at [facility], [address] [place], [place] [address]          Radiology Studies: Ct Head Wo Contrast  Result Date: 12/19/2018 CLINICAL DATA:  Fall down stairs. EXAM: CT HEAD WITHOUT CONTRAST CT MAXILLOFACIAL WITHOUT CONTRAST CT CERVICAL SPINE WITHOUT CONTRAST TECHNIQUE: Multidetector CT imaging of the head, cervical spine, and maxillofacial structures were performed using the standard protocol without intravenous contrast. Multiplanar CT image reconstructions of the cervical spine and maxillofacial structures were also generated. COMPARISON:  None. FINDINGS: CT HEAD FINDINGS Brain: There is no mass,  hemorrhage or extra-axial collection. The size and configuration of the ventricles and extra-axial CSF spaces are normal. The brain parenchyma is normal, without evidence of acute or chronic infarction. Vascular: No abnormal hyperdensity of the major intracranial arteries or dural venous sinuses. No intracranial atherosclerosis. Skull: The visualized skull base, calvarium and extracranial soft tissues are normal. CT MAXILLOFACIAL FINDINGS Osseous: --Complex facial fracture types: No LeFort, zygomaticomaxillary complex or nasoorbitoethmoidal fracture. --Simple fracture types: None. --Mandible: Comminuted fracture of the mandible traversing the right mandibular body at the root of tooth 27 and the left mandibular angle across the roots of teeth 16 and 17. Temporal mandibular joints remain approximated. Orbits: The globes are intact. Normal appearance of the intra- and extraconal fat. Symmetric extraocular muscles and optic nerves. Sinuses: No fluid levels or advanced mucosal thickening. Soft tissues: Hematoma surrounding the left mandible with left facial abrasion. CT CERVICAL SPINE FINDINGS Alignment: No static subluxation. Facets are aligned. Occipital condyles and the lateral masses of C1-C2 are aligned. Skull base and vertebrae: No acute fracture. Soft tissues and spinal canal: No prevertebral fluid or swelling. No visible canal hematoma. Disc levels: No advanced spinal canal or neural foraminal stenosis. Upper chest: No pneumothorax, pulmonary nodule or pleural effusion. Other: Normal visualized paraspinal cervical soft tissues. IMPRESSION: 1. No acute intracranial abnormality. 2. Comminuted mandibular fracture extending from the right mandibular body to the left mandibular angle and involving the roots of teeth 16, 17 in 27. 3. No fracture or static subluxation of the cervical spine. Electronically Signed   By: Deatra Robinson M.D.   On: 12/19/2018 19:21   Ct Cervical Spine Wo Contrast  Result Date:  12/19/2018 CLINICAL DATA:  Fall down stairs. EXAM: CT HEAD WITHOUT CONTRAST CT MAXILLOFACIAL WITHOUT CONTRAST CT CERVICAL SPINE WITHOUT CONTRAST TECHNIQUE: Multidetector CT imaging of the head, cervical spine, and maxillofacial structures were performed using the standard protocol without intravenous contrast. Multiplanar CT image reconstructions of the cervical spine and maxillofacial structures were also generated. COMPARISON:  None. FINDINGS: CT HEAD FINDINGS Brain: There is no mass, hemorrhage or extra-axial collection. The size and configuration of the ventricles and extra-axial CSF spaces are normal. The brain parenchyma is normal, without evidence of acute or chronic infarction. Vascular: No abnormal hyperdensity of the major intracranial arteries or dural venous sinuses. No intracranial atherosclerosis. Skull: The visualized skull base, calvarium and extracranial soft tissues are normal. CT MAXILLOFACIAL FINDINGS Osseous: --Complex facial fracture types: No LeFort, zygomaticomaxillary complex or nasoorbitoethmoidal fracture. --Simple fracture types: None. --Mandible: Comminuted fracture of the mandible traversing the right mandibular body at the root of tooth 27 and the left mandibular angle across the roots of teeth 16 and 17. Temporal mandibular joints remain approximated. Orbits: The globes are intact. Normal appearance of the intra- and extraconal fat. Symmetric extraocular muscles and optic nerves. Sinuses: No fluid levels or advanced mucosal thickening. Soft tissues: Hematoma surrounding the left mandible with left facial abrasion. CT CERVICAL SPINE FINDINGS Alignment: No static subluxation.  Facets are aligned. Occipital condyles and the lateral masses of C1-C2 are aligned. Skull base and vertebrae: No acute fracture. Soft tissues and spinal canal: No prevertebral fluid or swelling. No visible canal hematoma. Disc levels: No advanced spinal canal or neural foraminal stenosis. Upper chest: No  pneumothorax, pulmonary nodule or pleural effusion. Other: Normal visualized paraspinal cervical soft tissues. IMPRESSION: 1. No acute intracranial abnormality. 2. Comminuted mandibular fracture extending from the right mandibular body to the left mandibular angle and involving the roots of teeth 16, 17 in 27. 3. No fracture or static subluxation of the cervical spine. Electronically Signed   By: Deatra Robinson M.D.   On: 12/19/2018 19:21   Ct Maxillofacial Wo Cm  Result Date: 12/19/2018 CLINICAL DATA:  Fall down stairs. EXAM: CT HEAD WITHOUT CONTRAST CT MAXILLOFACIAL WITHOUT CONTRAST CT CERVICAL SPINE WITHOUT CONTRAST TECHNIQUE: Multidetector CT imaging of the head, cervical spine, and maxillofacial structures were performed using the standard protocol without intravenous contrast. Multiplanar CT image reconstructions of the cervical spine and maxillofacial structures were also generated. COMPARISON:  None. FINDINGS: CT HEAD FINDINGS Brain: There is no mass, hemorrhage or extra-axial collection. The size and configuration of the ventricles and extra-axial CSF spaces are normal. The brain parenchyma is normal, without evidence of acute or chronic infarction. Vascular: No abnormal hyperdensity of the major intracranial arteries or dural venous sinuses. No intracranial atherosclerosis. Skull: The visualized skull base, calvarium and extracranial soft tissues are normal. CT MAXILLOFACIAL FINDINGS Osseous: --Complex facial fracture types: No LeFort, zygomaticomaxillary complex or nasoorbitoethmoidal fracture. --Simple fracture types: None. --Mandible: Comminuted fracture of the mandible traversing the right mandibular body at the root of tooth 27 and the left mandibular angle across the roots of teeth 16 and 17. Temporal mandibular joints remain approximated. Orbits: The globes are intact. Normal appearance of the intra- and extraconal fat. Symmetric extraocular muscles and optic nerves. Sinuses: No fluid levels or  advanced mucosal thickening. Soft tissues: Hematoma surrounding the left mandible with left facial abrasion. CT CERVICAL SPINE FINDINGS Alignment: No static subluxation. Facets are aligned. Occipital condyles and the lateral masses of C1-C2 are aligned. Skull base and vertebrae: No acute fracture. Soft tissues and spinal canal: No prevertebral fluid or swelling. No visible canal hematoma. Disc levels: No advanced spinal canal or neural foraminal stenosis. Upper chest: No pneumothorax, pulmonary nodule or pleural effusion. Other: Normal visualized paraspinal cervical soft tissues. IMPRESSION: 1. No acute intracranial abnormality. 2. Comminuted mandibular fracture extending from the right mandibular body to the left mandibular angle and involving the roots of teeth 16, 17 in 27. 3. No fracture or static subluxation of the cervical spine. Electronically Signed   By: Deatra Robinson M.D.   On: 12/19/2018 19:21        Scheduled Meds:  folic acid  1 mg Intravenous Daily   sodium chloride flush  3 mL Intravenous Q12H   thiamine injection  100 mg Intravenous Daily   Continuous Infusions:  clindamycin (CLEOCIN) IV     famotidine (PEPCID) IV 20 mg (12/20/18 1010)   potassium chloride       LOS: 0 days        Glade Lloyd, MD [facility] 12/20/2018, 10:52 AM

## 2018-12-20 NOTE — Progress Notes (Signed)
Dr. Hanley Ben made aware of patients heart rate, respirations and blood pressure. New orders received for Hydralazine.

## 2018-12-20 NOTE — Consult Note (Signed)
Reason for Consult: Mandible fracture Referring Physician: ER  Juan DownsBradley T Naumann is an 38 y.o. male.  HPI: 38 year old male with alcoholism fell two nights ago and injured his mandible.  He sought care yesterday from his dentist due to his teeth not being normally positioned.  He was sent to an oral surgeon who sent him to the ER.  Imaging was performed in the ER demonstrating bilateral mandible fractures.  He was also found to be suffering alcohol withdrawal and was admitted to the hospitalist service.  His heart rate and blood pressure have been elevated.  He complains of bilateral jaw pain and malocclusion.  History reviewed. No pertinent past medical history.  Past Surgical History:  Procedure Laterality Date  . HAND SURGERY  2001    Family History  Family history unknown: Yes    Social History:  reports that he has been smoking cigarettes. He has a 20.00 pack-year smoking history. He has never used smokeless tobacco. He reports current alcohol use of about 6.0 standard drinks of alcohol per week. He reports current drug use. Frequency: 3.00 times per week. Drug: Marijuana.  Allergies: No Known Allergies  Medications: I have reviewed the patient's current medications.  Results for orders placed or performed during the hospital encounter of 12/19/18 (from the past 48 hour(s))  Comprehensive metabolic panel     Status: Abnormal   Collection Time: 12/19/18  4:18 PM  Result Value Ref Range   Sodium 139 135 - 145 mmol/L   Potassium 3.6 3.5 - 5.1 mmol/L   Chloride 103 98 - 111 mmol/L   CO2 20 (L) 22 - 32 mmol/L   Glucose, Bld 132 (H) 70 - 99 mg/dL   BUN 6 6 - 20 mg/dL   Creatinine, Ser 4.090.98 0.61 - 1.24 mg/dL   Calcium 9.8 8.9 - 81.110.3 mg/dL   Total Protein 7.8 6.5 - 8.1 g/dL   Albumin 4.3 3.5 - 5.0 g/dL   AST 57 (H) 15 - 41 U/L   ALT 79 (H) 0 - 44 U/L   Alkaline Phosphatase 66 38 - 126 U/L   Total Bilirubin 1.5 (H) 0.3 - 1.2 mg/dL   GFR calc non Af Amer >60 >60 mL/min   GFR calc  Af Amer >60 >60 mL/min   Anion gap 16 (H) 5 - 15    Comment: Performed at [facility], [address] [place], [place] [address]  Ethanol     Status: None   Collection Time: 12/19/18  4:18 PM  Result Value Ref Range   Alcohol, Ethyl (B) <10 <10 mg/dL    Comment: (NOTE) Lowest detectable limit for serum alcohol is 10 mg/dL. For medical purposes only. Performed at [facility], [address] [place], [place] [address]   CBC with Differential     Status: Abnormal   Collection Time: 12/19/18  4:18 PM  Result Value Ref Range   WBC 18.5 (H) 4.0 - 10.5 K/uL   RBC 5.22 4.22 - 5.81 MIL/uL   Hemoglobin 17.7 (H) 13.0 - 17.0 g/dL   HCT 13.050.3 86.539.0 - 78.452.0 %   MCV 96.4 80.0 - 100.0 fL   MCH 33.9 26.0 - 34.0 pg   MCHC 35.2 30.0 - 36.0 g/dL   RDW 69.612.7 29.511.5 - 28.415.5 %   Platelets 203 150 - 400 K/uL   nRBC 0.0 0.0 - 0.2 %   Neutrophils Relative % 89 %   Neutro Abs 16.5 (H) 1.7 - 7.7 K/uL  Lymphocytes Relative 6 %   Lymphs Abs 1.1 0.7 - 4.0 K/uL   Monocytes Relative 4 %   Monocytes Absolute 0.7 0.1 - 1.0 K/uL   Eosinophils Relative 0 %   Eosinophils Absolute 0.0 0.0 - 0.5 K/uL   Basophils Relative 1 %   Basophils Absolute 0.2 (H) 0.0 - 0.1 K/uL   nRBC 0 0 /100 WBC   Abs Immature Granulocytes 0.00 0.00 - 0.07 K/uL    Comment: Performed at [facility], [address] [place], [place] [address]  Protime-INR     Status: None   Collection Time: 12/19/18  4:18 PM  Result Value Ref Range   Prothrombin Time 12.8 11.4 - 15.2 seconds   INR 1.0 0.8 - 1.2    Comment: (NOTE) INR goal varies based on device and disease states. Performed at [facility], [address] [place], [place] [address]   SARS Coronavirus 2 (CEPHEID - Performed in [facility] hospital lab), Hosp Order     Status: None   Collection Time: 12/19/18  4:27 PM  Result Value Ref Range   SARS Coronavirus 2 NEGATIVE NEGATIVE    Comment: (NOTE) If result is NEGATIVE SARS-CoV-2 target nucleic acids are  NOT DETECTED. The SARS-CoV-2 RNA is generally detectable in upper and lower  respiratory specimens during the acute phase of infection. The lowest  concentration of SARS-CoV-2 viral copies this assay can detect is 250  copies / mL. A negative result does not preclude SARS-CoV-2 infection  and should not be used as the sole basis for treatment or other  patient management decisions.  A negative result may occur with  improper specimen collection / handling, submission of specimen other  than nasopharyngeal swab, presence of viral mutation(s) within the  areas targeted by this assay, and inadequate number of viral copies  (<250 copies / mL). A negative result must be combined with clinical  observations, patient history, and epidemiological information. If result is POSITIVE SARS-CoV-2 target nucleic acids are DETECTED. The SARS-CoV-2 RNA is generally detectable in upper and lower  respiratory specimens dur ing the acute phase of infection.  Positive  results are indicative of active infection with SARS-CoV-2.  Clinical  correlation with patient history and other diagnostic information is  necessary to determine patient infection status.  Positive results do  not rule out bacterial infection or co-infection with other viruses. If result is PRESUMPTIVE POSTIVE SARS-CoV-2 nucleic acids MAY BE PRESENT.   A presumptive positive result was obtained on the submitted specimen  and confirmed on repeat testing.  While 2019 novel coronavirus  (SARS-CoV-2) nucleic acids may be present in the submitted sample  additional confirmatory testing may be necessary for epidemiological  and / or clinical management purposes  to differentiate between  SARS-CoV-2 and other Sarbecovirus currently known to infect humans.  If clinically indicated additional testing with an alternate test  methodology [phone]) is advised. The SARS-CoV-2 RNA is generally  detectable in upper and lower respiratory sp ecimens  during the acute  phase of infection. The expected result is Negative. Fact Sheet for Patients:  BoilerBrush.com.cy Fact Sheet for Healthcare Providers: https://pope.com/ This test is not yet approved or cleared by the [place] FDA and has been authorized for detection and/or diagnosis of SARS-CoV-2 by FDA under an Emergency Use Authorization (EUA).  This EUA will remain in effect (meaning this test can be used) for the duration of the COVID-19 declaration under Section 564(b)(1) of the Act, 21 U.S.C.  section 360bbb-3(b)(1), unless the authorization is terminated or revoked sooner. Performed at [facility], [address], [place], [place] [address]   Urine rapid drug screen (hosp performed)     Status: Abnormal   Collection Time: 12/19/18  6:50 PM  Result Value Ref Range   Opiates NONE DETECTED NONE DETECTED   Cocaine NONE DETECTED NONE DETECTED   Benzodiazepines NONE DETECTED NONE DETECTED   Amphetamines NONE DETECTED NONE DETECTED   Tetrahydrocannabinol POSITIVE (A) NONE DETECTED   Barbiturates NONE DETECTED NONE DETECTED    Comment: (NOTE) DRUG SCREEN FOR MEDICAL PURPOSES ONLY.  IF CONFIRMATION IS NEEDED FOR ANY PURPOSE, NOTIFY LAB WITHIN 5 DAYS. LOWEST DETECTABLE LIMITS FOR URINE DRUG SCREEN Drug Class                     Cutoff (ng/mL) Amphetamine and metabolites    1000 Barbiturate and metabolites    200 Benzodiazepine                 200 Tricyclics and metabolites     300 Opiates and metabolites        300 Cocaine and metabolites        300 THC                            50 Performed at [facility], [address] [place], [place] [address]   Urinalysis, Routine w reflex microscopic     Status: Abnormal   Collection Time: 12/19/18  6:55 PM  Result Value Ref Range   Color, Urine AMBER (A) YELLOW    Comment: BIOCHEMICALS MAY BE AFFECTED BY COLOR   APPearance HAZY (A) CLEAR   Specific Gravity,  Urine 1.024 1.005 - 1.030   pH 5.0 5.0 - 8.0   Glucose, UA NEGATIVE NEGATIVE mg/dL   Hgb urine dipstick NEGATIVE NEGATIVE   Bilirubin Urine NEGATIVE NEGATIVE   Ketones, ur 80 (A) NEGATIVE mg/dL   Protein, ur 30 (A) NEGATIVE mg/dL   Nitrite NEGATIVE NEGATIVE   Leukocytes,Ua TRACE (A) NEGATIVE   RBC / HPF 0-5 0 - 5 RBC/hpf   WBC, UA 11-20 0 - 5 WBC/hpf   Bacteria, UA RARE (A) NONE SEEN   Squamous Epithelial / LPF 0-5 0 - 5   Mucus PRESENT    Hyaline Casts, UA PRESENT     Comment: Performed at [facility], [address], [place], [place] [address]  Comprehensive metabolic panel     Status: Abnormal   Collection Time: 12/20/18  2:47 AM  Result Value Ref Range   Sodium 135 135 - 145 mmol/L   Potassium 3.4 (L) 3.5 - 5.1 mmol/L   Chloride 103 98 - 111 mmol/L   CO2 22 22 - 32 mmol/L   Glucose, Bld 117 (H) 70 - 99 mg/dL   BUN 5 (L) 6 - 20 mg/dL   Creatinine, Ser 2.95 0.61 - 1.24 mg/dL   Calcium 8.7 (L) 8.9 - 10.3 mg/dL   Total Protein 6.2 (L) 6.5 - 8.1 g/dL   Albumin 3.4 (L) 3.5 - 5.0 g/dL   AST 34 15 - 41 U/L   ALT 56 (H) 0 - 44 U/L   Alkaline Phosphatase 54 38 - 126 U/L   Total Bilirubin 1.6 (H) 0.3 - 1.2 mg/dL   GFR calc non Af Amer >60 >60 mL/min   GFR calc Af Amer >60 >60 mL/min   Anion  gap 10 5 - 15    Comment: Performed at [facility], [address], [place], [place] [address]  CBC WITH DIFFERENTIAL     Status: Abnormal   Collection Time: 12/20/18  2:47 AM  Result Value Ref Range   WBC 13.8 (H) 4.0 - 10.5 K/uL   RBC 4.22 4.22 - 5.81 MIL/uL   Hemoglobin 14.6 13.0 - 17.0 g/dL   HCT 60.4 54.0 - 98.1 %   MCV 97.9 80.0 - 100.0 fL   MCH 34.6 (H) 26.0 - 34.0 pg   MCHC 35.4 30.0 - 36.0 g/dL   RDW 19.1 47.8 - 29.5 %   Platelets 149 (L) 150 - 400 K/uL   nRBC 0.0 0.0 - 0.2 %   Neutrophils Relative % 83 %   Neutro Abs 11.5 (H) 1.7 - 7.7 K/uL   Lymphocytes Relative 8 %   Lymphs Abs 1.1 0.7 - 4.0 K/uL   Monocytes Relative 8 %   Monocytes Absolute 1.2 (H) 0.1 -  1.0 K/uL   Eosinophils Relative 0 %   Eosinophils Absolute 0.0 0.0 - 0.5 K/uL   Basophils Relative 0 %   Basophils Absolute 0.0 0.0 - 0.1 K/uL   Immature Granulocytes 1 %   Abs Immature Granulocytes 0.07 0.00 - 0.07 K/uL    Comment: Performed at [facility], [address], [place], [place] [address]  Magnesium     Status: None   Collection Time: 12/20/18  2:47 AM  Result Value Ref Range   Magnesium 2.1 1.7 - 2.4 mg/dL    Comment: Performed at [facility], [address] [place], [place] [address]    Ct Head Wo Contrast  Result Date: 12/19/2018 CLINICAL DATA:  Fall down stairs. EXAM: CT HEAD WITHOUT CONTRAST CT MAXILLOFACIAL WITHOUT CONTRAST CT CERVICAL SPINE WITHOUT CONTRAST TECHNIQUE: Multidetector CT imaging of the head, cervical spine, and maxillofacial structures were performed using the standard protocol without intravenous contrast. Multiplanar CT image reconstructions of the cervical spine and maxillofacial structures were also generated. COMPARISON:  None. FINDINGS: CT HEAD FINDINGS Brain: There is no mass, hemorrhage or extra-axial collection. The size and configuration of the ventricles and extra-axial CSF spaces are normal. The brain parenchyma is normal, without evidence of acute or chronic infarction. Vascular: No abnormal hyperdensity of the major intracranial arteries or dural venous sinuses. No intracranial atherosclerosis. Skull: The visualized skull base, calvarium and extracranial soft tissues are normal. CT MAXILLOFACIAL FINDINGS Osseous: --Complex facial fracture types: No LeFort, zygomaticomaxillary complex or nasoorbitoethmoidal fracture. --Simple fracture types: None. --Mandible: Comminuted fracture of the mandible traversing the right mandibular body at the root of tooth 27 and the left mandibular angle across the roots of teeth 16 and 17. Temporal mandibular joints remain approximated. Orbits: The globes are intact. Normal appearance of the intra- and  extraconal fat. Symmetric extraocular muscles and optic nerves. Sinuses: No fluid levels or advanced mucosal thickening. Soft tissues: Hematoma surrounding the left mandible with left facial abrasion. CT CERVICAL SPINE FINDINGS Alignment: No static subluxation. Facets are aligned. Occipital condyles and the lateral masses of C1-C2 are aligned. Skull base and vertebrae: No acute fracture. Soft tissues and spinal canal: No prevertebral fluid or swelling. No visible canal hematoma. Disc levels: No advanced spinal canal or neural foraminal stenosis. Upper chest: No pneumothorax, pulmonary nodule or pleural effusion. Other: Normal visualized paraspinal cervical soft tissues. IMPRESSION: 1. No acute intracranial abnormality. 2. Comminuted mandibular fracture extending from the right mandibular body to the  left mandibular angle and involving the roots of teeth 16, 17 in 27. 3. No fracture or static subluxation of the cervical spine. Electronically Signed   By: Deatra Robinson M.D.   On: 12/19/2018 19:21   Ct Cervical Spine Wo Contrast  Result Date: 12/19/2018 CLINICAL DATA:  Fall down stairs. EXAM: CT HEAD WITHOUT CONTRAST CT MAXILLOFACIAL WITHOUT CONTRAST CT CERVICAL SPINE WITHOUT CONTRAST TECHNIQUE: Multidetector CT imaging of the head, cervical spine, and maxillofacial structures were performed using the standard protocol without intravenous contrast. Multiplanar CT image reconstructions of the cervical spine and maxillofacial structures were also generated. COMPARISON:  None. FINDINGS: CT HEAD FINDINGS Brain: There is no mass, hemorrhage or extra-axial collection. The size and configuration of the ventricles and extra-axial CSF spaces are normal. The brain parenchyma is normal, without evidence of acute or chronic infarction. Vascular: No abnormal hyperdensity of the major intracranial arteries or dural venous sinuses. No intracranial atherosclerosis. Skull: The visualized skull base, calvarium and extracranial soft  tissues are normal. CT MAXILLOFACIAL FINDINGS Osseous: --Complex facial fracture types: No LeFort, zygomaticomaxillary complex or nasoorbitoethmoidal fracture. --Simple fracture types: None. --Mandible: Comminuted fracture of the mandible traversing the right mandibular body at the root of tooth 27 and the left mandibular angle across the roots of teeth 16 and 17. Temporal mandibular joints remain approximated. Orbits: The globes are intact. Normal appearance of the intra- and extraconal fat. Symmetric extraocular muscles and optic nerves. Sinuses: No fluid levels or advanced mucosal thickening. Soft tissues: Hematoma surrounding the left mandible with left facial abrasion. CT CERVICAL SPINE FINDINGS Alignment: No static subluxation. Facets are aligned. Occipital condyles and the lateral masses of C1-C2 are aligned. Skull base and vertebrae: No acute fracture. Soft tissues and spinal canal: No prevertebral fluid or swelling. No visible canal hematoma. Disc levels: No advanced spinal canal or neural foraminal stenosis. Upper chest: No pneumothorax, pulmonary nodule or pleural effusion. Other: Normal visualized paraspinal cervical soft tissues. IMPRESSION: 1. No acute intracranial abnormality. 2. Comminuted mandibular fracture extending from the right mandibular body to the left mandibular angle and involving the roots of teeth 16, 17 in 27. 3. No fracture or static subluxation of the cervical spine. Electronically Signed   By: Deatra Robinson M.D.   On: 12/19/2018 19:21   Ct Maxillofacial Wo Cm  Result Date: 12/19/2018 CLINICAL DATA:  Fall down stairs. EXAM: CT HEAD WITHOUT CONTRAST CT MAXILLOFACIAL WITHOUT CONTRAST CT CERVICAL SPINE WITHOUT CONTRAST TECHNIQUE: Multidetector CT imaging of the head, cervical spine, and maxillofacial structures were performed using the standard protocol without intravenous contrast. Multiplanar CT image reconstructions of the cervical spine and maxillofacial structures were also  generated. COMPARISON:  None. FINDINGS: CT HEAD FINDINGS Brain: There is no mass, hemorrhage or extra-axial collection. The size and configuration of the ventricles and extra-axial CSF spaces are normal. The brain parenchyma is normal, without evidence of acute or chronic infarction. Vascular: No abnormal hyperdensity of the major intracranial arteries or dural venous sinuses. No intracranial atherosclerosis. Skull: The visualized skull base, calvarium and extracranial soft tissues are normal. CT MAXILLOFACIAL FINDINGS Osseous: --Complex facial fracture types: No LeFort, zygomaticomaxillary complex or nasoorbitoethmoidal fracture. --Simple fracture types: None. --Mandible: Comminuted fracture of the mandible traversing the right mandibular body at the root of tooth 27 and the left mandibular angle across the roots of teeth 16 and 17. Temporal mandibular joints remain approximated. Orbits: The globes are intact. Normal appearance of the intra- and extraconal fat. Symmetric extraocular muscles and optic nerves. Sinuses: No fluid levels  or advanced mucosal thickening. Soft tissues: Hematoma surrounding the left mandible with left facial abrasion. CT CERVICAL SPINE FINDINGS Alignment: No static subluxation. Facets are aligned. Occipital condyles and the lateral masses of C1-C2 are aligned. Skull base and vertebrae: No acute fracture. Soft tissues and spinal canal: No prevertebral fluid or swelling. No visible canal hematoma. Disc levels: No advanced spinal canal or neural foraminal stenosis. Upper chest: No pneumothorax, pulmonary nodule or pleural effusion. Other: Normal visualized paraspinal cervical soft tissues. IMPRESSION: 1. No acute intracranial abnormality. 2. Comminuted mandibular fracture extending from the right mandibular body to the left mandibular angle and involving the roots of teeth 16, 17 in 27. 3. No fracture or static subluxation of the cervical spine. Electronically Signed   By: Deatra Robinson M.D.    On: 12/19/2018 19:21    Review of Systems  All other systems reviewed and are negative.  Blood pressure (!) 157/111, pulse (!) 127, temperature 98 F (36.7 C), temperature source Oral, resp. rate (!) 23, height  (1.854 m), weight 102.1 kg, SpO2 94 %. Physical Exam  Constitutional: He is oriented to person, place, and time. He appears well-developed and well-nourished. No distress.  HENT:  Head: Normocephalic.  Right Ear: External ear normal.  Left Ear: External ear normal.  Nose: Nose normal.  Mouth/Throat: Oropharynx is clear and moist.  Cross-bite malocclusion.  Mucosal defect at right anterior and left posterior mandible fracture sites.  Eyes: Pupils are equal, round, and reactive to light. Conjunctivae and EOM are normal.  Neck: Normal range of motion. Neck supple.  Cardiovascular: Normal rate.  Respiratory: Effort normal.  Neurological: He is alert and oriented to person, place, and time. No cranial nerve deficit.  Skin: Skin is warm and dry.  Psychiatric: He has a normal mood and affect. His behavior is normal. Judgment and thought content normal.    Assessment/Plan: Bilateral mandible fracture, alcoholism  I personally reviewed his maxillofacial CT demonstrating a right body and left angle mandible fractures.  We discussed his injuries and options for repair.  I recommended treatment with maxillomandibular fixation for six weeks.  Risks, benefits, and alternatives were discussed and he expressed understanding and agreement.  He has social support and hopes to quit drinking.  He plans to stay with his sister.  I emphasized the danger of combining maxillomandibular fixation and alcoholism and he understands.  He should continue alcohol withdrawal management.  Christia Reading 12/20/2018, 9:07 AM

## 2018-12-21 ENCOUNTER — Encounter (HOSPITAL_COMMUNITY): Payer: Self-pay | Admitting: Certified Registered"

## 2018-12-21 ENCOUNTER — Encounter (HOSPITAL_COMMUNITY)
Admission: EM | Disposition: A | Discharge status: Home / Self Care | Payer: Self-pay | Source: Home / Self Care | Attending: Internal Medicine

## 2018-12-21 ENCOUNTER — Inpatient Hospital Stay (HOSPITAL_COMMUNITY): Payer: 59 | Admitting: Anesthesiology

## 2018-12-21 HISTORY — PX: ORIF MANDIBULAR FRACTURE: SHX2127

## 2018-12-21 HISTORY — PX: FACIAL LACERATION REPAIR: SHX6589

## 2018-12-21 LAB — SURGICAL PCR SCREEN
MRSA, PCR: NEGATIVE
Staphylococcus aureus: NEGATIVE

## 2018-12-21 SURGERY — OPEN REDUCTION INTERNAL FIXATION (ORIF) MANDIBULAR FRACTURE
Anesthesia: General | Site: Head

## 2018-12-21 MED ORDER — METOPROLOL TARTRATE 5 MG/5ML IV SOLN
INTRAVENOUS | Status: AC
  Filled 2018-12-21: qty 5

## 2018-12-21 MED ORDER — DEXMEDETOMIDINE HCL IN NACL 200 MCG/50ML IV SOLN
INTRAVENOUS | Status: AC
  Filled 2018-12-21: qty 50

## 2018-12-21 MED ORDER — ROCURONIUM BROMIDE 10 MG/ML (PF) SYRINGE
PREFILLED_SYRINGE | INTRAVENOUS | Status: AC
  Filled 2018-12-21: qty 10

## 2018-12-21 MED ORDER — BACITRACIN ZINC 500 UNIT/GM EX OINT
TOPICAL_OINTMENT | CUTANEOUS | Status: AC
  Filled 2018-12-21: qty 28.35

## 2018-12-21 MED ORDER — LIDOCAINE 2% (20 MG/ML) 5 ML SYRINGE
INTRAMUSCULAR | Status: DC | PRN
  Administered 2018-12-21: 60 mg via INTRAVENOUS

## 2018-12-21 MED ORDER — DEXAMETHASONE SODIUM PHOSPHATE 10 MG/ML IJ SOLN
INTRAMUSCULAR | Status: DC | PRN
  Administered 2018-12-21: 10 mg via INTRAVENOUS

## 2018-12-21 MED ORDER — ONDANSETRON HCL 4 MG/2ML IJ SOLN
INTRAMUSCULAR | Status: AC
  Filled 2018-12-21: qty 2

## 2018-12-21 MED ORDER — 0.9 % SODIUM CHLORIDE (POUR BTL) OPTIME
TOPICAL | Status: DC | PRN
  Administered 2018-12-21: 08:00:00 1000 mL

## 2018-12-21 MED ORDER — OXYMETAZOLINE HCL 0.05 % NA SOLN
NASAL | Status: AC
  Filled 2018-12-21: qty 30

## 2018-12-21 MED ORDER — SUCCINYLCHOLINE CHLORIDE 200 MG/10ML IV SOSY
PREFILLED_SYRINGE | INTRAVENOUS | Status: AC
  Filled 2018-12-21: qty 10

## 2018-12-21 MED ORDER — SUCCINYLCHOLINE CHLORIDE 20 MG/ML IJ SOLN
INTRAMUSCULAR | Status: DC | PRN
  Administered 2018-12-21: 90 mg via INTRAVENOUS

## 2018-12-21 MED ORDER — PROPOFOL 10 MG/ML IV BOLUS
INTRAVENOUS | Status: AC
  Filled 2018-12-21: qty 20

## 2018-12-21 MED ORDER — LIDOCAINE-EPINEPHRINE 1 %-1:100000 IJ SOLN
INTRAMUSCULAR | Status: AC
  Filled 2018-12-21: qty 1

## 2018-12-21 MED ORDER — HYDROCODONE-ACETAMINOPHEN 7.5-325 MG/15ML PO SOLN
15.0000 mL | ORAL | Status: DC | PRN
  Administered 2018-12-21 – 2018-12-24 (×9): 15 mL via ORAL
  Filled 2018-12-21 (×11): qty 15

## 2018-12-21 MED ORDER — LIDOCAINE 2% (20 MG/ML) 5 ML SYRINGE
INTRAMUSCULAR | Status: AC
  Filled 2018-12-21: qty 5

## 2018-12-21 MED ORDER — ROCURONIUM BROMIDE 10 MG/ML (PF) SYRINGE
PREFILLED_SYRINGE | INTRAVENOUS | Status: DC | PRN
  Administered 2018-12-21 (×3): 20 mg via INTRAVENOUS
  Administered 2018-12-21: 40 mg via INTRAVENOUS
  Administered 2018-12-21 (×2): 20 mg via INTRAVENOUS
  Administered 2018-12-21: 10 mg via INTRAVENOUS
  Administered 2018-12-21: 20 mg via INTRAVENOUS

## 2018-12-21 MED ORDER — LIDOCAINE-EPINEPHRINE 1 %-1:100000 IJ SOLN
INTRAMUSCULAR | Status: DC | PRN
  Administered 2018-12-21: 10 mL

## 2018-12-21 MED ORDER — BACITRACIN ZINC 500 UNIT/GM EX OINT
TOPICAL_OINTMENT | CUTANEOUS | Status: DC | PRN
  Administered 2018-12-21: 1 via TOPICAL

## 2018-12-21 MED ORDER — PROPOFOL 10 MG/ML IV BOLUS
INTRAVENOUS | Status: DC | PRN
  Administered 2018-12-21: 140 mg via INTRAVENOUS

## 2018-12-21 MED ORDER — DEXMEDETOMIDINE HCL IN NACL 200 MCG/50ML IV SOLN
INTRAVENOUS | Status: DC | PRN
  Administered 2018-12-21: .5 ug/kg/h via INTRAVENOUS

## 2018-12-21 MED ORDER — OXYMETAZOLINE HCL 0.05 % NA SOLN
NASAL | Status: DC | PRN
  Administered 2018-12-21: 2 via NASAL

## 2018-12-21 MED ORDER — METOPROLOL TARTRATE 5 MG/5ML IV SOLN
INTRAVENOUS | Status: DC | PRN
  Administered 2018-12-21 (×3): 1 mg via INTRAVENOUS

## 2018-12-21 MED ORDER — LACTATED RINGERS IV SOLN
INTRAVENOUS | Status: DC | PRN
  Administered 2018-12-21 (×2): via INTRAVENOUS

## 2018-12-21 MED ORDER — SUGAMMADEX SODIUM 200 MG/2ML IV SOLN
INTRAVENOUS | Status: DC | PRN
  Administered 2018-12-21: 200 mg via INTRAVENOUS

## 2018-12-21 MED ORDER — FENTANYL CITRATE (PF) 100 MCG/2ML IJ SOLN
INTRAMUSCULAR | Status: DC | PRN
  Administered 2018-12-21 (×2): 100 ug via INTRAVENOUS

## 2018-12-21 MED ORDER — ONDANSETRON HCL 4 MG/2ML IJ SOLN
INTRAMUSCULAR | Status: DC | PRN
  Administered 2018-12-21: 4 mg via INTRAVENOUS

## 2018-12-21 MED ORDER — MIDAZOLAM HCL 2 MG/2ML IJ SOLN
INTRAMUSCULAR | Status: DC | PRN
  Administered 2018-12-21: 2 mg via INTRAVENOUS

## 2018-12-21 MED ORDER — FENTANYL CITRATE (PF) 250 MCG/5ML IJ SOLN
INTRAMUSCULAR | Status: AC
  Filled 2018-12-21: qty 5

## 2018-12-21 MED ORDER — DEXAMETHASONE SODIUM PHOSPHATE 10 MG/ML IJ SOLN
INTRAMUSCULAR | Status: AC
  Filled 2018-12-21: qty 1

## 2018-12-21 MED ORDER — SUCCINYLCHOLINE CHLORIDE 200 MG/10ML IV SOSY
PREFILLED_SYRINGE | INTRAVENOUS | Status: DC | PRN
  Administered 2018-12-21: 80 mg via INTRAVENOUS

## 2018-12-21 MED ORDER — NICOTINE 14 MG/24HR TD PT24
14.0000 mg | MEDICATED_PATCH | Freq: Every day | TRANSDERMAL | Status: DC
  Administered 2018-12-21 – 2018-12-24 (×4): 14 mg via TRANSDERMAL
  Filled 2018-12-21 (×5): qty 1

## 2018-12-21 MED ORDER — MIDAZOLAM HCL 2 MG/2ML IJ SOLN
INTRAMUSCULAR | Status: AC
  Filled 2018-12-21: qty 2

## 2018-12-21 SURGICAL SUPPLY — 68 items
BIOPATCH RED 1 DISK 7.0 (GAUZE/BANDAGES/DRESSINGS) ×3 IMPLANT
BIOPATCH RED 1IN DISK 7.0MM (GAUZE/BANDAGES/DRESSINGS) ×1
BIT DRILL RAINBOW 1.6X35 (BIT) ×8 IMPLANT
BIT DRILL TWIST 1.6X58MM (BIT) ×2 IMPLANT
BIT DRILL UPPR FCE 1.0 12 TWST (DRILL) ×2 IMPLANT
BLADE CLIPPER SURG (BLADE) IMPLANT
BLADE SURG 15 STRL LF DISP TIS (BLADE) ×2 IMPLANT
BLADE SURG 15 STRL SS (BLADE) ×2
CANISTER SUCT 3000ML PPV (MISCELLANEOUS) ×4 IMPLANT
CLEANER TIP ELECTROSURG 2X2 (MISCELLANEOUS) ×8 IMPLANT
COVER SURGICAL LIGHT HANDLE (MISCELLANEOUS) ×8 IMPLANT
CRADLE DONUT ADULT HEAD (MISCELLANEOUS) IMPLANT
DECANTER SPIKE VIAL GLASS SM (MISCELLANEOUS) ×4 IMPLANT
DRAIN HEMOVAC 7FR (DRAIN) ×4 IMPLANT
DRAPE HALF SHEET 40X57 (DRAPES) ×4 IMPLANT
DRAPE ORTHO SPLIT 77X108 STRL (DRAPES) ×2
DRAPE SURG ORHT 6 SPLT 77X108 (DRAPES) ×2 IMPLANT
DRILL TWIST 1.6X58MM (BIT) ×4
DRILL UPPER FACE 1.0 12M TWIST (DRILL) ×4
ELECT COATED BLADE 2.86 ST (ELECTRODE) ×8 IMPLANT
ELECT REM PT RETURN 9FT ADLT (ELECTROSURGICAL) ×4
ELECTRODE REM PT RTRN 9FT ADLT (ELECTROSURGICAL) ×2 IMPLANT
EVACUATOR SILICONE 100CC (DRAIN) ×4 IMPLANT
GLOVE BIO SURGEON STRL SZ7 (GLOVE) ×12 IMPLANT
GLOVE BIO SURGEON STRL SZ7.5 (GLOVE) ×16 IMPLANT
GOWN STRL REUS W/ TWL LRG LVL3 (GOWN DISPOSABLE) ×8 IMPLANT
GOWN STRL REUS W/TWL LRG LVL3 (GOWN DISPOSABLE) ×8
KIT BASIN OR (CUSTOM PROCEDURE TRAY) ×4 IMPLANT
KIT TURNOVER KIT B (KITS) ×4 IMPLANT
NEEDLE HYPO 25GX1X1/2 BEV (NEEDLE) ×4 IMPLANT
NEEDLE PRECISIONGLIDE 27X1.5 (NEEDLE) ×4 IMPLANT
NS IRRIG 1000ML POUR BTL (IV SOLUTION) ×4 IMPLANT
PAD ARMBOARD 7.5X6 YLW CONV (MISCELLANEOUS) ×8 IMPLANT
PENCIL BUTTON HOLSTER BLD 10FT (ELECTRODE) ×8 IMPLANT
PLATE 4H FRACTURE (Plate) ×4 IMPLANT
PLATE 8 H STRAIGHT UPPER FACE (Plate) ×4 IMPLANT
PLATE HYBRID MMF (Plate) ×8 IMPLANT
SCISSORS WIRE ANG 4 3/4 DISP (INSTRUMENTS) ×4 IMPLANT
SCREW LOCK SELFDRIL 2.0X8M MMF (Screw) ×40 IMPLANT
SCREW LOCKING 2.3X10MM (Screw) ×8 IMPLANT
SCREW MNDBLE 2.0X14 LOCKING (Screw) ×4 IMPLANT
SCREW MNDBLE 2.0X6 LOCKING (Screw) ×16 IMPLANT
SCREW MNDBLE 2.0X8 LOCKING (Screw) ×4 IMPLANT
SCREW UPPERFACE 1.2X4M SLF TAP (Screw) ×12 IMPLANT
SPONGE LAP 18X18 RF (DISPOSABLE) ×4 IMPLANT
SUT ETHILON 2 0 FS 18 (SUTURE) ×4 IMPLANT
SUT ETHILON 4 0 CL P 3 (SUTURE) IMPLANT
SUT MNCRL AB 4-0 PS2 18 (SUTURE) ×4 IMPLANT
SUT MON AB 3-0 SH 27 (SUTURE) ×4
SUT MON AB 3-0 SH27 (SUTURE) ×4 IMPLANT
SUT PROLENE 5 0 PS 2 (SUTURE) ×12 IMPLANT
SUT PROLENE 6 0 PC 1 (SUTURE) IMPLANT
SUT SILK 2 0 SH CR/8 (SUTURE) ×4 IMPLANT
SUT STEEL 0 (SUTURE)
SUT STEEL 0 18XMFL TIE 17 (SUTURE) IMPLANT
SUT STEEL 1 (SUTURE) ×4 IMPLANT
SUT STEEL 2 (SUTURE) IMPLANT
SUT STEEL 4 (SUTURE) IMPLANT
SUT VIC AB 3-0 SH 27 (SUTURE) ×2
SUT VIC AB 3-0 SH 27X BRD (SUTURE) ×2 IMPLANT
SUT VIC AB 4-0 PS2 18 (SUTURE) ×8 IMPLANT
SUT VIC AB 4-0 SH 27 (SUTURE) ×2
SUT VIC AB 4-0 SH 27XBRD (SUTURE) ×2 IMPLANT
SUT VICRYL 4-0 PS2 18IN ABS (SUTURE) ×4 IMPLANT
TOWEL GREEN STERILE FF (TOWEL DISPOSABLE) ×12 IMPLANT
TRAY ENT MC OR (CUSTOM PROCEDURE TRAY) ×8 IMPLANT
TRAY URETHRAL FOLEY CATH 14FR (CATHETERS) ×4 IMPLANT
WATER STERILE IRR 1000ML POUR (IV SOLUTION) ×4 IMPLANT

## 2018-12-21 NOTE — Transfer of Care (Signed)
Immediate Anesthesia Transfer of Care Note  Patient: Juan Crawford  Procedure(s) Performed: MAXILLO-MANDIBULAR FIXATION AND ORIF OF MANDIBLE FRACTURE (N/A Head) Facial Laceration Repair (N/A Chin)  Patient Location: PACU  Anesthesia Type:General  Level of Consciousness: drowsy and patient cooperative  Airway & Oxygen Therapy: Patient Spontanous Breathing and Patient connected to face mask  Post-op Assessment: Report given to RN  Post vital signs: Reviewed and stable  Last Vitals:  Vitals Value Taken Time  BP 120/71 12/21/2018 11:58 AM  Temp    Pulse 104 12/21/2018 12:01 PM  Resp 17 12/21/2018 12:01 PM  SpO2 94 % 12/21/2018 12:01 PM  Vitals shown include unvalidated device data.  Last Pain:  Vitals:   12/21/18 0509  TempSrc:   PainSc: 2       Patients Stated Pain Goal: 3 (12/21/18 0409)  Complications: No apparent anesthesia complications

## 2018-12-21 NOTE — Anesthesia Preprocedure Evaluation (Addendum)
Anesthesia Evaluation  Patient identified by MRN, date of birth, ID band Patient awake    Reviewed: Allergy & Precautions, NPO status , Patient's Chart, lab work & pertinent test results  History of Anesthesia Complications Negative for: history of anesthetic complications  Airway Mallampati: IV   Neck ROM: Full  Mouth opening: Limited Mouth Opening  Dental  (+) Teeth Intact, Poor Dentition, Loose, Chipped, Dental Advisory Given,    Pulmonary neg recent URI, Current Smoker,    breath sounds clear to auscultation       Cardiovascular hypertension,  Rhythm:Regular Rate:Tachycardia     Neuro/Psych PSYCHIATRIC DISORDERS negative neurological ROS     GI/Hepatic negative GI ROS, (+)     substance abuse  alcohol use,   Endo/Other    Renal/GU negative Renal ROS     Musculoskeletal negative musculoskeletal ROS (+)   Abdominal   Peds  Hematology negative hematology ROS (+)   Anesthesia Other Findings   Reproductive/Obstetrics                          Anesthesia Physical Anesthesia Plan  ASA: III  Anesthesia Plan: General   Post-op Pain Management:    Induction: Intravenous, Rapid sequence and Cricoid pressure planned  PONV Risk Score and Plan: 1 and Ondansetron and Dexamethasone  Airway Management Planned: Video Laryngoscope Planned and Nasal ETT  Additional Equipment: None  Intra-op Plan:   Post-operative Plan: Extubation in OR  Informed Consent: I have reviewed the patients History and Physical, chart, labs and discussed the procedure including the risks, benefits and alternatives for the proposed anesthesia with the patient or authorized representative who has indicated his/her understanding and acceptance.     Dental advisory given  Plan Discussed with: CRNA and Surgeon  Anesthesia Plan Comments:        Anesthesia Quick Evaluation

## 2018-12-21 NOTE — Op Note (Signed)
NAME: Juan Crawford, Juan Crawford MEDICAL RECORD [id number] ACCOUNT [id number] DATE OF BIRTH:Dec 23, 1980 FACILITY: [facility] LOCATION: [id number] PHYSICIAN:Tinsleigh Slovacek Pearletha Alfred, MD  OPERATIVE REPORT  DATE OF PROCEDURE:  12/21/2018  PREOPERATIVE DIAGNOSES: 1.  Right body and left angle mandible fractures. 2.  Chin laceration, 3 cm.  POSTOPERATIVE DIAGNOSES: 1.  Right body and left angle mandible fractures. 2.  Chin laceration, 3 cm.    PROCEDURE: 1.  Open reduction internal fixation of mandible fractures with maxillomandibular fixation. 2.  Intermediate complexity closure of 3 cm chin laceration.  SURGEON:  Christia Reading, MD  ANESTHESIA:  General endotracheal anesthesia.  COMPLICATIONS:  None.  INDICATIONS:  The patient is a 38 year old male with polysubstance abuse and alcoholism who fell down stairs 3 nights ago.  The next day he noticed his teeth did not feel right, so he went to a dentist, who sent him to an oral surgeon, who sent him to  the emergency department.  He was admitted to the hospital and hospitalist service to manage alcohol withdrawal.  He was unable to be brought to the operating room for the first 2 days of admission because of hypertension and tachycardia.  He was brought  to the operating room today for surgical management.  FINDINGS:  Efforts to bring the mandibular teeth into normal occlusion were difficult.  Because I could not bring the left molars together, I converted the case to an open reduction internal fixation after discussing this with his mother by telephone.   The right body fracture was easily brought into reduction, but still the teeth did not come together on the left.  The left angle fracture was approached through the neck and was able to be reduced and plated, but the teeth still did not come together on  the left.  Arch bars were placed, and the patient was placed into maxillomandibular fixation with the right-sided teeth in good occlusion and the left side  still in open appearance.  This could not be reduced any further with various efforts and was  left in this position.  The chin wound was a horizontal wound that was down to the depth of the bone and closed in layers.  DESCRIPTION OF PROCEDURE:  The patient was identified in the holding room, informed consent having been obtained including discussion of risks, benefits and alternatives.  The patient was brought to the operative suite and put on the operative table in  supine position.  Anesthesia was induced, and the patient was intubated by the anesthesia team via nasotracheal approach.  The patient had been receiving intravenous antibiotics in the hospital.  His eyes were taped closed and then was first turned 90  degrees from anesthesia.  The lower face was draped and then examined under anesthesia.  The jaw was manipulated in various ways to try to bring the teeth into occlusion.  The superior and inferior gingival buccal sulcus was injected with local  anesthetic.  The superior arch bar was trimmed and placed with 8 mm screws in 6 positions.  The right-sided fracture was then opened by making an incision in the gingival buccal sulcus using Bovie electrocautery and extending this down onto the face of  the mandible.  Soft tissues were elevated off the mandible, exposing the fracture.  A drill hole was placed on either side of the fracture and reduction forceps were placed.  A 4-hole 1.2 mm plate was then placed across the superior portion of the  fracture and secured with 4 mm screws.  The  reduction forceps were removed.  A 4-hole 2.0 mm plate was then placed against the mandible inferiorly and secured and drill holes were made.  The screws were measured with a depth gauge, and appropriate length  locking screws were placed.  Again, efforts were made to bring the mandible into reduction, and the left side still remained open.  The inferior arch bar was then placed after first trimming it and using 8 mm  screws.  At this point, the case was  discussed with his mother by phone and incision made to approach the left-sided fracture to the neck.  Thus, drapes were removed and the bed was changed to 180 degrees.  The left neck incision was marked with a marking pen and injected with 1% lidocaine  with 1:100,000 epinephrine.  The left neck and lower face were prepped and draped in a sterile fashion.  An incision was made with a 15-blade scalpel and extended through the subcutaneous tissues to the platysma muscle.  The incision through the platysma  muscle was made more inferiorly and the platysmal muscle was then elevated.  Dissection was performed directly onto the sternocleidomastoid muscle and in the inferior extent of the submandibular gland.  Soft tissues were elevated, staying along the  capsule of the submandibular gland.  This allowed exposure of the inferior surface of the mandible using Bovie electrocautery.  Elevators were then used to elevate soft tissues off of the surface of the mandible, and this was extended anteriorly until  the fracture was encountered.  It was a fairly far forward position but was able to be visualized.  The bone was brought into reduction.  In order to place a plate, a stab hole was first injected with local anesthetic and then made with a 15-blade  scalpel along the jawline midway along the mandible body.  This was extended deeply to the mandible using blunt dissection.  The drill guide contraption was then placed through the stab wound and then around to the incision.  A 4-hole 2.0 mm plate was  then placed over the bone.  Drill holes were then made and appropriate length locking screws were placed through the drill guide, reducing and fixating the fracture.  At this point, the drill guide was removed.  The mouth was again encountered, examined,  and occlusion remained difficult on the left side with the teeth remaining open but the right side coming into good occlusion.  It  was determined that at this point this was as good as we can do, so 22-gauge wires were then looped from the inferior to  the superior arch bar in 4 separate positions and tightened down.  The throat was suctioned.  The inferior gingival buccal incision was closed with 4-0 Monocryl in a simple running fashion.  The chin wound was then examined and copiously irrigated with  saline.  It was closed then in the muscular layer using 4-0 Vicryl suture in a simple interrupted fashion and in the skin layer using 5-0 Prolene in simple interrupted fashion.  Bacitracin ointment was added.  The left neck wound was copiously irrigated  with saline.  A 7-French round drain was placed in the depth of the wound and secured to the skin using 2-0 nylon suture and a standard drain stitch.  The platysmal layer was closed with 3-0 Vicryl suture in a simple running fashion.  The subcutaneous  layer was closed with 4-0 Vicryl suture in a simple interrupted fashion.  The skin was closed with staples  and bacitracin was added.  The patient was then cleaned off and drapes were removed.  The drain was placed to bulb suction.  He was then returned  to anesthesia for wakeup and was extubated and taken to the recovery room in stable condition.  LN/NUANCE  D:12/21/2018 T:12/21/2018 JOB:[id number]

## 2018-12-21 NOTE — Brief Op Note (Signed)
12/21/2018  11:40 AM  PATIENT:  Juan Crawford  38 y.o. male  PRE-OPERATIVE DIAGNOSIS:  MANDIBLE FRACTURE  POST-OPERATIVE DIAGNOSIS:  mandible fracture  PROCEDURE:  Procedure(s) with comments: MAXILLO-MANDIBULAR FIXATION AND ORIF OF MANDIBLE FRACTURE (N/A) Facial Laceration Repair (N/A) - 3cm wound on chin  SURGEON:  Surgeon(s) and Role:    Christia Reading, MD - Primary  PHYSICIAN ASSISTANT:   ASSISTANTS: none   ANESTHESIA:   general  EBL:  40 mL   BLOOD ADMINISTERED:none  DRAINS: (7 Fr) Jackson-Pratt drain(s) with closed bulb suction in the left neck   LOCAL MEDICATIONS USED:  LIDOCAINE   SPECIMEN:  No Specimen  DISPOSITION OF SPECIMEN:  N/A  COUNTS:  YES  TOURNIQUET:  * No tourniquets in log *  DICTATION: .Other Dictation: Dictation Number [id number]  PLAN OF CARE: Return to hospital room.  PATIENT DISPOSITION:  PACU - hemodynamically stable.   Delay start of Pharmacological VTE agent (>24hrs) due to surgical blood loss or risk of bleeding: no

## 2018-12-21 NOTE — Anesthesia Procedure Notes (Signed)
Procedure Name: Intubation Date/Time: 12/21/2018 7:53 AM Performed by: De Nurse, CRNA Pre-anesthesia Checklist: Patient identified, Emergency Drugs available, Suction available and Patient being monitored Patient Re-evaluated:Patient Re-evaluated prior to induction Oxygen Delivery Method: Circle System Utilized Preoxygenation: Pre-oxygenation with 100% oxygen Induction Type: IV induction and Rapid sequence Laryngoscope Size: Glidescope and 3 Grade View: Grade I Nasal Tubes: Nasal Rae, Nasal prep performed and Right Tube size: 7.0 mm Number of attempts: 1 Placement Confirmation: ETT inserted through vocal cords under direct vision,  positive ETCO2 and breath sounds checked- equal and bilateral Secured at: 26 cm Tube secured with: Tape Dental Injury: Teeth and Oropharynx as per pre-operative assessment

## 2018-12-21 NOTE — OR Nursing (Signed)
At this time Dr. Jenne Pane decided he needed a different approach to fixating the jaw. He broke scrub, called patients mother Aggie Moats Derwin([phone]) to update her on Tabius's condition and receive consent to continue with different approach to jaw fixation. I spoke with Taunee as well after Dr. Jenne Pane and confirmed her consent.

## 2018-12-21 NOTE — Progress Notes (Signed)
Elevated DBP > 100. Admin Labetalol per PRN ordersl. Manual BP 133/74.

## 2018-12-21 NOTE — Progress Notes (Signed)
   Subjective:    Patient ID: Juan Crawford, male    DOB: 1980/10/15, 38 y.o.   MRN: [id number]  HPI  Doing well post-op.  Facial pain and swelling.  He clarified that his left molars have never come together when chewing, that is his baseline bite.  Review of Systems     Objective:   Physical Exam AF P113, T 99.5, BP 147/96 Alert, NAD Teeth in tight fixation.  Right good occlusion, left open bite. Left neck incision clean and intact, drain in place. Facial edema.    Assessment & Plan:  S/P ORIF mandible fracture, MMF, chin laceration closure  Good postop check.  Occlusion appears to equal his pre-injury status and he is pleased.  Will remove drain in 1-2 days.  Plan maxillomandibular fixation for 4 weeks and will then transition to elastics.

## 2018-12-21 NOTE — Progress Notes (Signed)
Patient ID: Juan Crawford, male   DOB: [birth year], 38 y.o.   MRN: [id number]  PROGRESS NOTE    Juan Crawford  MVH:[id number] DOB: 09-Nov-1980 DOA: 12/19/2018 PCP: Patient, No Pcp Per   Brief Narrative:  38 year old male with history of alcoholism presented on 12/19/2018 with a fall and jaw injury.  He was found to have mandibular fracture.  ENT was consulted who recommended medical admission because of features of alcohol withdrawal.  Assessment & Plan:   Principal Problem:   Alcohol withdrawal (HCC) Active Problems:   Bilateral fracture of mandible, closed, initial encounter [facility])   Hypertensive urgency  Mandibular fracture status post fall -ENT following.  For surgical intervention today.  Continue pain management.  Continue n.p.o. -Fall precautions  Alcohol dependence with withdrawal -Currently still hypertensive and tachycardic and requiring as needed antihypertensives IV. -Continue CIWA protocol still requiring intravenous Ativan. -Continue multivitamin, thiamine and folic acid -Counseled about alcohol cessation -We will request social worker to give resources for outpatient programs.  Hypertensive urgency -Blood pressure is still extremely elevated. -We will use PRN IV antihypertensives  Sinus tachycardia -Probably from alcohol withdrawal, dehydration and pain -Monitor.   DVT prophylaxis: SCDs Code Status: Full Family Communication: None at bedside Disposition Plan: Depends on clinical outcome Consultants: ENT  Procedures: None  Antimicrobials: None   Subjective: Patient seen and examined at bedside.  Had low-grade temperature this morning.  Patient had to be given IV Ativan and IV labetalol overnight and early this morning.  Still complains of facial/jaw pain. Objective: Vitals:   12/21/18 0000 12/21/18 0353 12/21/18 0400 12/21/18 0458  BP: (!) 146/93  (!) 114/110 133/74  Pulse: (!) 117  (!) 132   Resp:      Temp:  100.1 F (37.8 C)    TempSrc:   Oral    SpO2:      Weight:      Height:        Intake/Output Summary (Last 24 hours) at 12/21/2018 1043 Last data filed at 12/21/2018 0930 Gross per 24 hour  Intake 1558.77 ml  Output 1220 ml  Net 338.77 ml   Filed Weights   12/19/18 1555 12/19/18 2224  Weight: 88.5 kg 102.1 kg    Examination:  General exam: Appears anxious.  Looks older than stated age.  Slightly tremulous.  ENT/face: Face and mandible swollen Respiratory system: Bilateral decreased breath sounds at bases, tachypneic Cardiovascular system: S1 & S2 heard, tachycardic  gastrointestinal system: Abdomen is nondistended, soft and nontender. Normal bowel sounds heard. Extremities: No cyanosis, edema    Data Reviewed: I have personally reviewed following labs and imaging studies  CBC: Recent Labs  Lab 12/19/18 1618 12/20/18 0247  WBC 18.5* 13.8*  NEUTROABS 16.5* 11.5*  HGB 17.7* 14.6  HCT 50.3 41.3  MCV 96.4 97.9  PLT 203 149*   Basic Metabolic Panel: Recent Labs  Lab 12/19/18 1618 12/20/18 0247  NA 139 135  K 3.6 3.4*  CL 103 103  CO2 20* 22  GLUCOSE 132* 117*  BUN 6 5*  CREATININE 0.98 0.83  CALCIUM 9.8 8.7*  MG  --  2.1   GFR: Estimated Creatinine Clearance: 154.5 mL/min (by C-G formula based on SCr of 0.83 mg/dL). Liver Function Tests: Recent Labs  Lab 12/19/18 1618 12/20/18 0247  AST 57* 34  ALT 79* 56*  ALKPHOS 66 54  BILITOT 1.5* 1.6*  PROT 7.8 6.2*  ALBUMIN 4.3 3.4*   No results for input(s): LIPASE, AMYLASE in the last 168  hours. No results for input(s): AMMONIA in the last 168 hours. Coagulation Profile: Recent Labs  Lab 12/19/18 1618  INR 1.0   Cardiac Enzymes: No results for input(s): CKTOTAL, CKMB, CKMBINDEX, TROPONINI in the last 168 hours. BNP (last 3 results) No results for input(s): PROBNP in the last 8760 hours. HbA1C: No results for input(s): HGBA1C in the last 72 hours. CBG: No results for input(s): GLUCAP in the last 168 hours. Lipid Profile: No  results for input(s): CHOL, HDL, LDLCALC, TRIG, CHOLHDL, LDLDIRECT in the last 72 hours. Thyroid Function Tests: No results for input(s): TSH, T4TOTAL, FREET4, T3FREE, THYROIDAB in the last 72 hours. Anemia Panel: No results for input(s): VITAMINB12, FOLATE, FERRITIN, TIBC, IRON, RETICCTPCT in the last 72 hours. Sepsis Labs: No results for input(s): PROCALCITON, LATICACIDVEN in the last 168 hours.  Recent Results (from the past 240 hour(s))  SARS Coronavirus 2 (CEPHEID - Performed in [facility] hospital lab), Hosp Order     Status: None   Collection Time: 12/19/18  4:27 PM  Result Value Ref Range Status   SARS Coronavirus 2 NEGATIVE NEGATIVE Final    Comment: (NOTE) If result is NEGATIVE SARS-CoV-2 target nucleic acids are NOT DETECTED. The SARS-CoV-2 RNA is generally detectable in upper and lower  respiratory specimens during the acute phase of infection. The lowest  concentration of SARS-CoV-2 viral copies this assay can detect is 250  copies / mL. A negative result does not preclude SARS-CoV-2 infection  and should not be used as the sole basis for treatment or other  patient management decisions.  A negative result may occur with  improper specimen collection / handling, submission of specimen other  than nasopharyngeal swab, presence of viral mutation(s) within the  areas targeted by this assay, and inadequate number of viral copies  (<250 copies / mL). A negative result must be combined with clinical  observations, patient history, and epidemiological information. If result is POSITIVE SARS-CoV-2 target nucleic acids are DETECTED. The SARS-CoV-2 RNA is generally detectable in upper and lower  respiratory specimens dur ing the acute phase of infection.  Positive  results are indicative of active infection with SARS-CoV-2.  Clinical  correlation with patient history and other diagnostic information is  necessary to determine patient infection status.  Positive results do  not  rule out bacterial infection or co-infection with other viruses. If result is PRESUMPTIVE POSTIVE SARS-CoV-2 nucleic acids MAY BE PRESENT.   A presumptive positive result was obtained on the submitted specimen  and confirmed on repeat testing.  While 2019 novel coronavirus  (SARS-CoV-2) nucleic acids may be present in the submitted sample  additional confirmatory testing may be necessary for epidemiological  and / or clinical management purposes  to differentiate between  SARS-CoV-2 and other Sarbecovirus currently known to infect humans.  If clinically indicated additional testing with an alternate test  methodology [phone]) is advised. The SARS-CoV-2 RNA is generally  detectable in upper and lower respiratory sp ecimens during the acute  phase of infection. The expected result is Negative. Fact Sheet for Patients:  BoilerBrush.com.cy Fact Sheet for Healthcare Providers: https://pope.com/ This test is not yet approved or cleared by the [place] FDA and has been authorized for detection and/or diagnosis of SARS-CoV-2 by FDA under an Emergency Use Authorization (EUA).  This EUA will remain in effect (meaning this test can be used) for the duration of the COVID-19 declaration under Section 564(b)(1) of the Act, 21 U.S.C. section 360bbb-3(b)(1), unless the authorization is terminated or revoked  sooner. Performed at [facility], [address] [place], [place] [address]   Surgical pcr screen     Status: None   Collection Time: 12/20/18 11:20 PM  Result Value Ref Range Status   MRSA, PCR NEGATIVE NEGATIVE Final   Staphylococcus aureus NEGATIVE NEGATIVE Final    Comment: (NOTE) The Xpert SA Assay (FDA approved for NASAL specimens in patients 54 years of age and older), is one component of a comprehensive surveillance program. It is not intended to diagnose infection nor to guide or monitor treatment. Performed at [facility], [address], [place], [place] [address]          Radiology Studies: Ct Head Wo Contrast  Result Date: 12/19/2018 CLINICAL DATA:  Fall down stairs. EXAM: CT HEAD WITHOUT CONTRAST CT MAXILLOFACIAL WITHOUT CONTRAST CT CERVICAL SPINE WITHOUT CONTRAST TECHNIQUE: Multidetector CT imaging of the head, cervical spine, and maxillofacial structures were performed using the standard protocol without intravenous contrast. Multiplanar CT image reconstructions of the cervical spine and maxillofacial structures were also generated. COMPARISON:  None. FINDINGS: CT HEAD FINDINGS Brain: There is no mass, hemorrhage or extra-axial collection. The size and configuration of the ventricles and extra-axial CSF spaces are normal. The brain parenchyma is normal, without evidence of acute or chronic infarction. Vascular: No abnormal hyperdensity of the major intracranial arteries or dural venous sinuses. No intracranial atherosclerosis. Skull: The visualized skull base, calvarium and extracranial soft tissues are normal. CT MAXILLOFACIAL FINDINGS Osseous: --Complex facial fracture types: No LeFort, zygomaticomaxillary complex or nasoorbitoethmoidal fracture. --Simple fracture types: None. --Mandible: Comminuted fracture of the mandible traversing the right mandibular body at the root of tooth 27 and the left mandibular angle across the roots of teeth 16 and 17. Temporal mandibular joints remain approximated. Orbits: The globes are intact. Normal appearance of the intra- and extraconal fat. Symmetric extraocular muscles and optic nerves. Sinuses: No fluid levels or advanced mucosal thickening. Soft tissues: Hematoma surrounding the left mandible with left facial abrasion. CT CERVICAL SPINE FINDINGS Alignment: No static subluxation. Facets are aligned. Occipital condyles and the lateral masses of C1-C2 are aligned. Skull base and vertebrae: No acute fracture. Soft tissues and spinal canal: No prevertebral fluid or  swelling. No visible canal hematoma. Disc levels: No advanced spinal canal or neural foraminal stenosis. Upper chest: No pneumothorax, pulmonary nodule or pleural effusion. Other: Normal visualized paraspinal cervical soft tissues. IMPRESSION: 1. No acute intracranial abnormality. 2. Comminuted mandibular fracture extending from the right mandibular body to the left mandibular angle and involving the roots of teeth 16, 17 in 27. 3. No fracture or static subluxation of the cervical spine. Electronically Signed   By: Deatra Robinson M.D.   On: 12/19/2018 19:21   Ct Cervical Spine Wo Contrast  Result Date: 12/19/2018 CLINICAL DATA:  Fall down stairs. EXAM: CT HEAD WITHOUT CONTRAST CT MAXILLOFACIAL WITHOUT CONTRAST CT CERVICAL SPINE WITHOUT CONTRAST TECHNIQUE: Multidetector CT imaging of the head, cervical spine, and maxillofacial structures were performed using the standard protocol without intravenous contrast. Multiplanar CT image reconstructions of the cervical spine and maxillofacial structures were also generated. COMPARISON:  None. FINDINGS: CT HEAD FINDINGS Brain: There is no mass, hemorrhage or extra-axial collection. The size and configuration of the ventricles and extra-axial CSF spaces are normal. The brain parenchyma is normal, without evidence of acute or chronic infarction. Vascular: No abnormal hyperdensity of the major intracranial arteries or dural venous sinuses. No intracranial atherosclerosis. Skull: The visualized skull base, calvarium and extracranial soft  tissues are normal. CT MAXILLOFACIAL FINDINGS Osseous: --Complex facial fracture types: No LeFort, zygomaticomaxillary complex or nasoorbitoethmoidal fracture. --Simple fracture types: None. --Mandible: Comminuted fracture of the mandible traversing the right mandibular body at the root of tooth 27 and the left mandibular angle across the roots of teeth 16 and 17. Temporal mandibular joints remain approximated. Orbits: The globes are intact.  Normal appearance of the intra- and extraconal fat. Symmetric extraocular muscles and optic nerves. Sinuses: No fluid levels or advanced mucosal thickening. Soft tissues: Hematoma surrounding the left mandible with left facial abrasion. CT CERVICAL SPINE FINDINGS Alignment: No static subluxation. Facets are aligned. Occipital condyles and the lateral masses of C1-C2 are aligned. Skull base and vertebrae: No acute fracture. Soft tissues and spinal canal: No prevertebral fluid or swelling. No visible canal hematoma. Disc levels: No advanced spinal canal or neural foraminal stenosis. Upper chest: No pneumothorax, pulmonary nodule or pleural effusion. Other: Normal visualized paraspinal cervical soft tissues. IMPRESSION: 1. No acute intracranial abnormality. 2. Comminuted mandibular fracture extending from the right mandibular body to the left mandibular angle and involving the roots of teeth 16, 17 in 27. 3. No fracture or static subluxation of the cervical spine. Electronically Signed   By: Deatra Robinson M.D.   On: 12/19/2018 19:21   Ct Maxillofacial Wo Cm  Result Date: 12/19/2018 CLINICAL DATA:  Fall down stairs. EXAM: CT HEAD WITHOUT CONTRAST CT MAXILLOFACIAL WITHOUT CONTRAST CT CERVICAL SPINE WITHOUT CONTRAST TECHNIQUE: Multidetector CT imaging of the head, cervical spine, and maxillofacial structures were performed using the standard protocol without intravenous contrast. Multiplanar CT image reconstructions of the cervical spine and maxillofacial structures were also generated. COMPARISON:  None. FINDINGS: CT HEAD FINDINGS Brain: There is no mass, hemorrhage or extra-axial collection. The size and configuration of the ventricles and extra-axial CSF spaces are normal. The brain parenchyma is normal, without evidence of acute or chronic infarction. Vascular: No abnormal hyperdensity of the major intracranial arteries or dural venous sinuses. No intracranial atherosclerosis. Skull: The visualized skull base,  calvarium and extracranial soft tissues are normal. CT MAXILLOFACIAL FINDINGS Osseous: --Complex facial fracture types: No LeFort, zygomaticomaxillary complex or nasoorbitoethmoidal fracture. --Simple fracture types: None. --Mandible: Comminuted fracture of the mandible traversing the right mandibular body at the root of tooth 27 and the left mandibular angle across the roots of teeth 16 and 17. Temporal mandibular joints remain approximated. Orbits: The globes are intact. Normal appearance of the intra- and extraconal fat. Symmetric extraocular muscles and optic nerves. Sinuses: No fluid levels or advanced mucosal thickening. Soft tissues: Hematoma surrounding the left mandible with left facial abrasion. CT CERVICAL SPINE FINDINGS Alignment: No static subluxation. Facets are aligned. Occipital condyles and the lateral masses of C1-C2 are aligned. Skull base and vertebrae: No acute fracture. Soft tissues and spinal canal: No prevertebral fluid or swelling. No visible canal hematoma. Disc levels: No advanced spinal canal or neural foraminal stenosis. Upper chest: No pneumothorax, pulmonary nodule or pleural effusion. Other: Normal visualized paraspinal cervical soft tissues. IMPRESSION: 1. No acute intracranial abnormality. 2. Comminuted mandibular fracture extending from the right mandibular body to the left mandibular angle and involving the roots of teeth 16, 17 in 27. 3. No fracture or static subluxation of the cervical spine. Electronically Signed   By: Deatra Robinson M.D.   On: 12/19/2018 19:21        Scheduled Meds:  [MAR Hold] folic acid  1 mg Intravenous Daily   [MAR Hold] sodium chloride flush  3 mL Intravenous Q12H   [  MAR Hold] thiamine injection  100 mg Intravenous Daily   Continuous Infusions:  [MAR Hold] potassium chloride       LOS: 1 day        Glade LloydKshitiz Syriah Delisi, MD [facility] 12/21/2018, 10:43 AM

## 2018-12-22 LAB — CBC WITH DIFFERENTIAL/PLATELET
Abs Immature Granulocytes: 0.1 10*3/uL — ABNORMAL HIGH (ref 0.00–0.07)
Basophils Absolute: 0 10*3/uL (ref 0.0–0.1)
Basophils Relative: 0 %
Eosinophils Absolute: 0 10*3/uL (ref 0.0–0.5)
Eosinophils Relative: 0 %
HCT: 44.8 % (ref 39.0–52.0)
Hemoglobin: 15.5 g/dL (ref 13.0–17.0)
Immature Granulocytes: 1 %
Lymphocytes Relative: 5 %
Lymphs Abs: 0.8 10*3/uL (ref 0.7–4.0)
MCH: 34.4 pg — ABNORMAL HIGH (ref 26.0–34.0)
MCHC: 34.6 g/dL (ref 30.0–36.0)
MCV: 99.3 fL (ref 80.0–100.0)
Monocytes Absolute: 1.5 10*3/uL — ABNORMAL HIGH (ref 0.1–1.0)
Monocytes Relative: 9 %
Neutro Abs: 14.6 10*3/uL — ABNORMAL HIGH (ref 1.7–7.7)
Neutrophils Relative %: 85 %
Platelets: 160 10*3/uL (ref 150–400)
RBC: 4.51 MIL/uL (ref 4.22–5.81)
RDW: 12.8 % (ref 11.5–15.5)
WBC: 17 10*3/uL — ABNORMAL HIGH (ref 4.0–10.5)
nRBC: 0 % (ref 0.0–0.2)

## 2018-12-22 LAB — COMPREHENSIVE METABOLIC PANEL
ALT: 35 U/L (ref 0–44)
AST: 24 U/L (ref 15–41)
Albumin: 3.3 g/dL — ABNORMAL LOW (ref 3.5–5.0)
Alkaline Phosphatase: 58 U/L (ref 38–126)
Anion gap: 10 (ref 5–15)
BUN: 8 mg/dL (ref 6–20)
CO2: 26 mmol/L (ref 22–32)
Calcium: 9.3 mg/dL (ref 8.9–10.3)
Chloride: 103 mmol/L (ref 98–111)
Creatinine, Ser: 0.84 mg/dL (ref 0.61–1.24)
GFR calc Af Amer: >60 mL/min (ref >60)
GFR calc non Af Amer: >60 mL/min (ref >60)
Glucose, Bld: 108 mg/dL — ABNORMAL HIGH (ref 70–99)
Potassium: 4.2 mmol/L (ref 3.5–5.1)
Sodium: 139 mmol/L (ref 135–145)
Total Bilirubin: 0.8 mg/dL (ref 0.3–1.2)
Total Protein: 6.8 g/dL (ref 6.5–8.1)

## 2018-12-22 LAB — MAGNESIUM: Magnesium: 2.4 mg/dL (ref 1.7–2.4)

## 2018-12-22 MED ORDER — DEXTROSE-NACL 5-0.9 % IV SOLN
INTRAVENOUS | Status: DC
  Administered 2018-12-22 – 2018-12-24 (×3): via INTRAVENOUS

## 2018-12-22 MED ORDER — BACITRACIN ZINC 500 UNIT/GM EX OINT
TOPICAL_OINTMENT | Freq: Two times a day (BID) | CUTANEOUS | Status: DC
  Administered 2018-12-22 – 2018-12-24 (×5): via TOPICAL
  Filled 2018-12-22: qty 28.4

## 2018-12-22 MED ORDER — CHLORHEXIDINE GLUCONATE 0.12 % MT SOLN
15.0000 mL | Freq: Two times a day (BID) | OROMUCOSAL | Status: DC
  Administered 2018-12-22 – 2018-12-24 (×5): 15 mL via OROMUCOSAL
  Filled 2018-12-22 (×4): qty 15

## 2018-12-22 NOTE — Progress Notes (Signed)
Patient ID: Juan Crawford, male   DOB: [birth year], 38 y.o.   MRN: [id number]  PROGRESS NOTE    TRYPP HECKMANN  WUJ:[id number] DOB: Nov 30, 1980 DOA: 12/19/2018 PCP: Patient, No Pcp Per   Brief Narrative:  38 year old male with history of alcoholism presented on 12/19/2018 with a fall and jaw injury.  He was found to have mandibular fracture.  ENT was consulted who recommended medical admission because of features of alcohol withdrawal.  He underwent ORIF of mandible fractures on 12/21/2018.  Assessment & Plan:   Principal Problem:   Alcohol withdrawal (HCC) Active Problems:   Bilateral fracture of mandible, closed, initial encounter [facility])   Hypertensive urgency  Mandibular fracture status post fall -ENT following.  Status post ORIF of mandible fractures on 12/21/2018.  Continue pain management.  -Fall precautions  Leukocytosis -Probably reactive.  Monitor  Alcohol dependence with withdrawal -Currently still hypertensive and tachycardic.  Continue as needed antihypertensives -Continue CIWA protocol -Continue thiamine and folic acid -Counseled about alcohol cessation -We will request social worker to give resources for outpatient programs.  Hypertensive urgency -Blood pressure is still extremely elevated. -We will use PRN IV antihypertensives  Sinus tachycardia -Probably from alcohol withdrawal, dehydration and pain -Monitor.   DVT prophylaxis: SCDs Code Status: Full Family Communication: None at bedside Disposition Plan: Depends on clinical outcome Consultants: ENT  Procedures: ORIF of mandibular fractures on 12/21/2018  Antimicrobials: None   Subjective: Patient seen and examined at bedside.  Complains of facial and jaw pain.  Poor historian.  No overnight fever or vomiting.   Objective: Vitals:   12/21/18 2144 12/22/18 0135 12/22/18 0517 12/22/18 0555  BP:  (!) 145/105 (!) 173/113 (!) 169/104  Pulse:  (!) 102 (!) 118 (!) 117  Resp:  Temp: 98.5 F  (36.9 C)  98.5 F (36.9 C)   TempSrc: Axillary  Axillary   SpO2:  93% 95% 93%  Weight:      Height:        Intake/Output Summary (Last 24 hours) at 12/22/2018 0805 Last data filed at 12/22/2018 0518 Gross per 24 hour  Intake 1570 ml  Output 2635 ml  Net -1065 ml   Filed Weights   12/19/18 1555 12/19/18 2224  Weight: 88.5 kg 102.1 kg    Examination:  General exam: Appears anxious.  Looks older than stated age.  Poor historian.  ENT/face: Face and mandible swollen Respiratory system: Bilateral decreased breath sounds at bases.  No wheezing Cardiovascular system: S1 & S2 heard, still tachycardic  gastrointestinal system: Abdomen is nondistended, soft and nontender. Normal bowel sounds heard. Extremities: No cyanosis, edema    Data Reviewed: I have personally reviewed following labs and imaging studies  CBC: Recent Labs  Lab 12/19/18 1618 12/20/18 0247 12/22/18 0335  WBC 18.5* 13.8* 17.0*  NEUTROABS 16.5* 11.5* 14.6*  HGB 17.7* 14.6 15.5  HCT 50.3 41.3 44.8  MCV 96.4 97.9 99.3  PLT 203 149* 160   Basic Metabolic Panel: Recent Labs  Lab 12/19/18 1618 12/20/18 0247 12/22/18 0335  NA 139 135 139  K 3.6 3.4* 4.2  CL 103 103 103  CO2 20* 22 26  GLUCOSE 132* 117* 108*  BUN 6 5* 8  CREATININE 0.98 0.83 0.84  CALCIUM 9.8 8.7* 9.3  MG  --  2.1 2.4   GFR: Estimated Creatinine Clearance: 152.7 mL/min (by C-G formula based on SCr of 0.84 mg/dL). Liver Function Tests: Recent Labs  Lab 12/19/18 1618 12/20/18 0247 12/22/18 0335  AST 57* 34 24  ALT 79* 56* 35  ALKPHOS 66 54 58  BILITOT 1.5* 1.6* 0.8  PROT 7.8 6.2* 6.8  ALBUMIN 4.3 3.4* 3.3*   No results for input(s): LIPASE, AMYLASE in the last 168 hours. No results for input(s): AMMONIA in the last 168 hours. Coagulation Profile: Recent Labs  Lab 12/19/18 1618  INR 1.0   Cardiac Enzymes: No results for input(s): CKTOTAL, CKMB, CKMBINDEX, TROPONINI in the last 168 hours. BNP (last 3 results) No results  for input(s): PROBNP in the last 8760 hours. HbA1C: No results for input(s): HGBA1C in the last 72 hours. CBG: No results for input(s): GLUCAP in the last 168 hours. Lipid Profile: No results for input(s): CHOL, HDL, LDLCALC, TRIG, CHOLHDL, LDLDIRECT in the last 72 hours. Thyroid Function Tests: No results for input(s): TSH, T4TOTAL, FREET4, T3FREE, THYROIDAB in the last 72 hours. Anemia Panel: No results for input(s): VITAMINB12, FOLATE, FERRITIN, TIBC, IRON, RETICCTPCT in the last 72 hours. Sepsis Labs: No results for input(s): PROCALCITON, LATICACIDVEN in the last 168 hours.  Recent Results (from the past 240 hour(s))  SARS Coronavirus 2 (CEPHEID - Performed in [facility] hospital lab), Hosp Order     Status: None   Collection Time: 12/19/18  4:27 PM  Result Value Ref Range Status   SARS Coronavirus 2 NEGATIVE NEGATIVE Final    Comment: (NOTE) If result is NEGATIVE SARS-CoV-2 target nucleic acids are NOT DETECTED. The SARS-CoV-2 RNA is generally detectable in upper and lower  respiratory specimens during the acute phase of infection. The lowest  concentration of SARS-CoV-2 viral copies this assay can detect is 250  copies / mL. A negative result does not preclude SARS-CoV-2 infection  and should not be used as the sole basis for treatment or other  patient management decisions.  A negative result may occur with  improper specimen collection / handling, submission of specimen other  than nasopharyngeal swab, presence of viral mutation(s) within the  areas targeted by this assay, and inadequate number of viral copies  (<250 copies / mL). A negative result must be combined with clinical  observations, patient history, and epidemiological information. If result is POSITIVE SARS-CoV-2 target nucleic acids are DETECTED. The SARS-CoV-2 RNA is generally detectable in upper and lower  respiratory specimens dur ing the acute phase of infection.  Positive  results are indicative of  active infection with SARS-CoV-2.  Clinical  correlation with patient history and other diagnostic information is  necessary to determine patient infection status.  Positive results do  not rule out bacterial infection or co-infection with other viruses. If result is PRESUMPTIVE POSTIVE SARS-CoV-2 nucleic acids MAY BE PRESENT.   A presumptive positive result was obtained on the submitted specimen  and confirmed on repeat testing.  While 2019 novel coronavirus  (SARS-CoV-2) nucleic acids may be present in the submitted sample  additional confirmatory testing may be necessary for epidemiological  and / or clinical management purposes  to differentiate between  SARS-CoV-2 and other Sarbecovirus currently known to infect humans.  If clinically indicated additional testing with an alternate test  methodology [phone]) is advised. The SARS-CoV-2 RNA is generally  detectable in upper and lower respiratory sp ecimens during the acute  phase of infection. The expected result is Negative. Fact Sheet for Patients:  BoilerBrush.com.cy Fact Sheet for Healthcare Providers: https://pope.com/ This test is not yet approved or cleared by the [place] FDA and has been authorized for detection and/or diagnosis of SARS-CoV-2 by FDA under an  Emergency Use Authorization (EUA).  This EUA will remain in effect (meaning this test can be used) for the duration of the COVID-19 declaration under Section 564(b)(1) of the Act, 21 U.S.C. section 360bbb-3(b)(1), unless the authorization is terminated or revoked sooner. Performed at [facility], [address] [place], [place] [address]   Surgical pcr screen     Status: None   Collection Time: 12/20/18 11:20 PM  Result Value Ref Range Status   MRSA, PCR NEGATIVE NEGATIVE Final   Staphylococcus aureus NEGATIVE NEGATIVE Final    Comment: (NOTE) The Xpert SA Assay (FDA approved for NASAL specimens in  patients 37 years of age and older), is one component of a comprehensive surveillance program. It is not intended to diagnose infection nor to guide or monitor treatment. Performed at [facility], [address] [place], [place] [address]          Radiology Studies: No results found.      Scheduled Meds: . folic acid  1 mg Intravenous Daily  . nicotine  14 mg Transdermal Daily  . sodium chloride flush  3 mL Intravenous Q12H  . thiamine injection  100 mg Intravenous Daily   Continuous Infusions: . potassium chloride       LOS: 2 days        Glade LloydKshitiz Bodie Abernethy, MD [facility] 12/22/2018, 8:05 AM

## 2018-12-22 NOTE — Progress Notes (Signed)
   Subjective:    Patient ID: Juan Crawford, male    DOB: 1981-06-06, 38 y.o.   MRN: [id number]  HPI Still feels sore in face.  Teeth tight.  Review of Systems     Objective:   Physical Exam T 98.5 P 118 BP 173/113 Alert, NAD Teeth in tight occlusion.  Facial edema. Left neck incision clean and intact, drain 45 cc and removed     Assessment & Plan:  Bilateral mandible fracture  Doing well.  Drain removed.  Continue clindamycin for total of 10 days, can convert to oral liquid clindamycin when ready.  Will order Peridex mouth rinse.  Antibiotic ointment twice daily to chin and neck.  Wired jaw diet.  Consider nutritionist consult.  He needs to keep wire cutters with him at all times in case of emergency.  Follow-up in one week.

## 2018-12-23 ENCOUNTER — Encounter (HOSPITAL_COMMUNITY): Payer: Self-pay | Admitting: Otolaryngology

## 2018-12-23 ENCOUNTER — Inpatient Hospital Stay (HOSPITAL_COMMUNITY): Payer: 59

## 2018-12-23 DIAGNOSIS — R509 Fever, unspecified: Secondary | ICD-10-CM

## 2018-12-23 LAB — CBC WITH DIFFERENTIAL/PLATELET
Abs Immature Granulocytes: 0.06 10*3/uL (ref 0.00–0.07)
Basophils Absolute: 0 10*3/uL (ref 0.0–0.1)
Basophils Relative: 0 %
Eosinophils Absolute: 0 10*3/uL (ref 0.0–0.5)
Eosinophils Relative: 0 %
HCT: 42.6 % (ref 39.0–52.0)
Hemoglobin: 14.4 g/dL (ref 13.0–17.0)
Immature Granulocytes: 0 %
Lymphocytes Relative: 7 %
Lymphs Abs: 0.9 10*3/uL (ref 0.7–4.0)
MCH: 33.6 pg (ref 26.0–34.0)
MCHC: 33.8 g/dL (ref 30.0–36.0)
MCV: 99.5 fL (ref 80.0–100.0)
Monocytes Absolute: 1.6 10*3/uL — ABNORMAL HIGH (ref 0.1–1.0)
Monocytes Relative: 12 %
Neutro Abs: 11.3 10*3/uL — ABNORMAL HIGH (ref 1.7–7.7)
Neutrophils Relative %: 81 %
Platelets: 183 10*3/uL (ref 150–400)
RBC: 4.28 MIL/uL (ref 4.22–5.81)
RDW: 12.7 % (ref 11.5–15.5)
WBC: 14 10*3/uL — ABNORMAL HIGH (ref 4.0–10.5)
nRBC: 0 % (ref 0.0–0.2)

## 2018-12-23 LAB — COMPREHENSIVE METABOLIC PANEL
ALT: 33 U/L (ref 0–44)
AST: 27 U/L (ref 15–41)
Albumin: 2.9 g/dL — ABNORMAL LOW (ref 3.5–5.0)
Alkaline Phosphatase: 52 U/L (ref 38–126)
Anion gap: 10 (ref 5–15)
BUN: 7 mg/dL (ref 6–20)
CO2: 23 mmol/L (ref 22–32)
Calcium: 8.6 mg/dL — ABNORMAL LOW (ref 8.9–10.3)
Chloride: 104 mmol/L (ref 98–111)
Creatinine, Ser: 0.81 mg/dL (ref 0.61–1.24)
GFR calc Af Amer: >60 mL/min (ref >60)
GFR calc non Af Amer: >60 mL/min (ref >60)
Glucose, Bld: 121 mg/dL — ABNORMAL HIGH (ref 70–99)
Potassium: 3.7 mmol/L (ref 3.5–5.1)
Sodium: 137 mmol/L (ref 135–145)
Total Bilirubin: 0.9 mg/dL (ref 0.3–1.2)
Total Protein: 6.2 g/dL — ABNORMAL LOW (ref 6.5–8.1)

## 2018-12-23 LAB — URINALYSIS, ROUTINE W REFLEX MICROSCOPIC
Bilirubin Urine: NEGATIVE
Glucose, UA: NEGATIVE mg/dL
Hgb urine dipstick: NEGATIVE
Ketones, ur: NEGATIVE mg/dL
Leukocytes,Ua: NEGATIVE
Nitrite: NEGATIVE
Protein, ur: NEGATIVE mg/dL
Specific Gravity, Urine: 1.01 (ref 1.005–1.030)
pH: 7 (ref 5.0–8.0)

## 2018-12-23 LAB — MAGNESIUM: Magnesium: 2.2 mg/dL (ref 1.7–2.4)

## 2018-12-23 MED ORDER — METOPROLOL TARTRATE 25 MG/10 ML ORAL SUSPENSION
25.0000 mg | Freq: Two times a day (BID) | ORAL | Status: DC
  Administered 2018-12-23 – 2018-12-24 (×2): 25 mg via ORAL
  Filled 2018-12-23 (×3): qty 10

## 2018-12-23 MED ORDER — ENSURE ENLIVE PO LIQD
237.0000 mL | Freq: Three times a day (TID) | ORAL | Status: DC
  Administered 2018-12-23: 237 mL via ORAL

## 2018-12-23 MED ORDER — AMLODIPINE 1 MG/ML ORAL SUSPENSION
10.0000 mg | Freq: Every day | ORAL | Status: DC
  Administered 2018-12-24: 10 mg via ORAL
  Filled 2018-12-23 (×2): qty 10

## 2018-12-23 MED ORDER — CLINDAMYCIN PALMITATE HCL 75 MG/5ML PO SOLR
300.0000 mg | Freq: Four times a day (QID) | ORAL | Status: DC
  Administered 2018-12-23 – 2018-12-24 (×6): 300 mg via ORAL
  Filled 2018-12-23 (×8): qty 20

## 2018-12-23 MED ORDER — AMLODIPINE BESYLATE 10 MG PO TABS
10.0000 mg | ORAL_TABLET | Freq: Every day | ORAL | Status: DC
  Administered 2018-12-23: 10 mg via ORAL
  Filled 2018-12-23: qty 1

## 2018-12-23 MED ORDER — ADULT MULTIVITAMIN LIQUID CH
15.0000 mL | Freq: Every day | ORAL | Status: DC
  Administered 2018-12-23 – 2018-12-24 (×2): 15 mL via ORAL
  Filled 2018-12-23 (×2): qty 15

## 2018-12-23 MED ORDER — ACETAMINOPHEN 650 MG RE SUPP: 650.0000 mg | RECTAL | Status: DC | PRN

## 2018-12-23 MED ORDER — CLINDAMYCIN PALMITATE HCL 75 MG/5ML PO SOLR
300.0000 mg | Freq: Four times a day (QID) | ORAL | Status: DC
  Filled 2018-12-23 (×2): qty 20

## 2018-12-23 MED ORDER — ACETAMINOPHEN 325 MG PO TABS: 650.0000 mg | ORAL_TABLET | ORAL | Status: DC | PRN

## 2018-12-23 MED ORDER — IBUPROFEN 400 MG PO TABS: 400.0000 mg | ORAL_TABLET | Freq: Four times a day (QID) | ORAL | Status: DC | PRN

## 2018-12-23 MED ORDER — IBUPROFEN 100 MG/5ML PO SUSP
400.0000 mg | Freq: Four times a day (QID) | ORAL | Status: DC | PRN
  Administered 2018-12-23 – 2018-12-24 (×2): 400 mg via ORAL
  Filled 2018-12-23 (×3): qty 20

## 2018-12-23 MED ORDER — METOPROLOL TARTRATE 25 MG/10 ML ORAL SUSPENSION
25.0000 mg | Freq: Two times a day (BID) | ORAL | Status: DC
  Administered 2018-12-23: 25 mg via ORAL
  Filled 2018-12-23: qty 10

## 2018-12-23 MED ORDER — METOPROLOL TARTRATE 25 MG PO TABS: 25.0000 mg | ORAL_TABLET | Freq: Two times a day (BID) | ORAL | Status: DC

## 2018-12-23 NOTE — Discharge Instructions (Addendum)
Jaw Fracture Nutrition Therapy   Because of your injury and the surgery that came afterward, youll have to change the foods you eat and the way theyre made.  All foods and drinks must be made to move easily through a straw or syringe.  To make this happen, you will need to plan your meals carefully so that you are getting the healthy foods and drinks that you need to heal well. Tips  Your doctor or registered dietitian will tell you to eat the blended food with a straw or syringe. Prepare all foods to a very smooth and thin consistency:  o Cut foods into small pieces and place in a strong blender or food processor. o Add liquids--such as fruit juice, milk, broth, vegetable juice, gravy, and soup--to the blender. Use equal amounts of liquids and food. o Strain the blended food if it contains lumps or chunks.  Put extra blended foods in the refrigerator or freezer for later use.  Have plenty of foods and drinks made ahead of your surgery so that you can eat every 2 to 3 hours. Foods Recommended Food Group Foods Recommended  Milk and Milk Products All  Meat and Other Protein Foods Tender, well-cooked meat, poultry, or fish prepared without bones, skin, or added fat Well-cooked eggs prepared without added fat Soft soy foods (like tofu) Smooth nut butters  Grains Rice Pasta Couscous without seeds and nuts Cooked cereals, such as oatmeal and cream of wheat  Vegetables Any cooked or canned vegetables without seeds and skins  Fruits Any cooked or canned fruits without seeds and skins Fresh, peeled soft fruits (like peaches and bananas) that can be blended until smooth  Fats and Oils Any oils  Melted butter or margarine  Beverages Any Look for liquid supplements that provide both calories and protein, such as [employer], Boost, or Ensure  Other Ground spices, seeds, and nuts Smooth condiments, such as mustard  Foods Not Recommended Avoid all foods that contain whole  nuts or seeds, pieces of nuts or seeds, skins, peels, or bones. All foods must be easily blended. Jaw Fracture Sample 1-Day Menu  Breakfast 1 cup cooked oatmeal 1 cup reduced-fat milk 1 teaspoon cinnamon 1 teaspoon brown sugar  Morning Snack 1 cup liquid pasteurized eggs (like Egg Beaters) 1 ripe banana 1 cup reduced-fat milk 1 tablespoon vanilla 1 teaspoon nutmeg  Lunch 2 cups cream of chicken soup 1/4 cup tender-cooked chicken 1/2 cup mashed potatoes 1/2 cup cooked vegetables 1 cup orange-pineapple juice  Afternoon Snack 12 oz liquid supplement (like Ensure)  Evening Meal 1 cup pasta sauce  Evening Snack 2 cups ice cream 1 cup milk  Copyright 2020  Academy of Nutrition and Dietetics. All rights reserved.  Nutrition Post Hospital Stay Proper nutrition can help your body recover from illness and injury.   Foods and beverages high in protein, vitamins, and minerals help rebuild muscle loss, promote healing, & reduce fall risk.   In addition to eating healthy foods, a nutrition shake is an easy, delicious way to get the nutrition you need during and after your hospital stay  It is recommended that you continue to drink 2 bottles per day of:       Ensure Plus or equivalent  for at least 1 month (30 days) after your hospital stay   Tips for adding a nutrition shake into your routine: As allowed, drink one with vitamins or medications instead of water or juice Enjoy one as a tasty mid-morning or  afternoon snack Drink cold or make a milkshake out of it Drink one instead of milk with cereal or snacks Use as a coffee creamer   Available at the following grocery stores and pharmacies:           * [employer] * [employer] * [employer]co  * Rite Aid          * Walmart * Sam's Club  * Walgreens      * Target  * BJ's   * CV[facility]  * Lowes Foods   * [facility] [phone]            For COUPONS visit: www.ensure.com/join or [email or web address]/[email or web address]/sign-[email or web address]   Suggested  Substitutions Ensure Plus = Boost Plus = Carnation Breakfast Essentials = Boost Compact Ensure Active Clear = Boost Breeze Glucerna Shake = Boost Glucose Control = Carnation Breakfast Essentials SUGAR FREE

## 2018-12-23 NOTE — Progress Notes (Addendum)
Pharmacy Antibiotic Note  Juan Crawford is a 38 y.o. male admitted on 12/19/2018 with jaw injury following a fall.  He underwent ORIF of mandible fractures on 12/21/18 (rec'd clindamycin 600 mg IV Q 8 hrs X 3 doses perio-op, with last dose at 0605 on 12/21/18). Pt with leukocytosis post-op (WBC down to 14K today); afebrile this AM.  Drain has been removed. Plan is for total of 10 days of clindamycin (oral liquid), per ENT. Pharmacy has been consulted for clindamycin dosing.   Medical history includes: alcohol dependence with withdrawal, hypertensive urgency sinus tachycardia.  Plan: Continue clindamycin dosing with oral liquid (300 mg PO Q 6 hrs) for total of 10 days (9 additional days), per ENT.  Height: 6\' 1"  (185.4 cm) Weight: 225 lb 1.4 oz (102.1 kg) IBW/kg (Calculated) : 79.9  Temp (24hrs), Avg:99.7 F (37.6 C), Min:98.2 F (36.8 C), Max:101.2 F (38.4 C)  Recent Labs  Lab 12/19/18 1618 12/20/18 0247 12/22/18 0335 12/23/18 0330  WBC 18.5* 13.8* 17.0* 14.0*  CREATININE 0.98 0.83 0.84 0.81    Estimated Creatinine Clearance: 158.4 mL/min (by C-G formula based on SCr of 0.81 mg/dL).    No Known Allergies  Antimicrobials this admission: Clindamycin 600 mg IV Q 8 hrs X 3 doses peri-op (last dose on 12/21/18 AM)   Microbiology results: 5/22: COVID-19 negative 5/23: HIV negative  Thank you for allowing pharmacy to be a part of this patient's care. As no further dosing adjustments are anticipated, Pharmacy will sign off this consult. Please re-consult [place] if we can be of further assistance.  Vicki Mallet, PharmD, BCPS, [facility] Clinical Pharmacist 12/23/2018 8:40 AM

## 2018-12-23 NOTE — Evaluation (Signed)
Physical Therapy Evaluation Patient Details Name: Juan Crawford MRN: [id number] DOB: 1980/09/12 Today's Date: 12/23/2018   History of Present Illness  38 y.o. male with medical history significant for alcoholism, now presenting to the emergency department for evaluation of severe jaw pain after a fall last night. He saw a dentist today for evaluation of this, radiographs demonstrated mandibular fracture, he was then directed to an oral surgeon who sent him to the ED. patient is found to be afebrile, saturating mid 90s on room air, slightly tachypneic, tachycardic in the 160s, and hypertensive to 190/120. s/p mandible ORIF 5/24. Also being tx for alcohol withdrawl.   Clinical Impression  Patient evaluated by Physical Therapy with no further acute PT needs identified. All education has been completed and the patient has no further questions. Pt is limited in movement by mandibular pain as well as generalized pain from fall.  Pt does not require any additional Physical Therapy or equipment needs. PT is signing off. Thank you for this referral.     Follow Up Recommendations No PT follow up    Equipment Recommendations  None recommended by PT       Precautions / Restrictions Precautions Precautions: Fall Restrictions Weight Bearing Restrictions: No      Mobility  Bed Mobility Overal bed mobility: Modified Independent             General bed mobility comments: increased time and effort   Transfers Overall transfer level: Needs assistance   Transfers: Sit to/from Stand Sit to Stand: Supervision         General transfer comment: supervision for safety, increased time and effort for steadying  Ambulation/Gait Ambulation/Gait assistance: Supervision Gait Distance (Feet): 250 Feet Assistive device: IV Pole Gait Pattern/deviations: Step-through pattern;Decreased step length - right;Decreased step length - left;Drifts right/left;Antalgic Gait velocity: slowed Gait velocity  interpretation: 1.31 - 2.62 ft/sec, indicative of limited community ambulator General Gait Details: supervision for safety, pt reports generalized sorness in joints, ambulates with slight drifting left and right with gait      Balance Overall balance assessment: Mild deficits observed, not formally tested                                           Pertinent Vitals/Pain Pain Assessment: 0-10 Pain Score: 5  Pain Location: mandible Pain Descriptors / Indicators: Aching;Tender;Throbbing;Sore Pain Intervention(s): Limited activity within patient's tolerance;Monitored during session;Premedicated before session;Repositioned    Home Living Family/patient expects to be discharged to:: Private residence Living Arrangements: Alone Available Help at Discharge: Family;Available PRN/intermittently Type of Home: House Home Access: Stairs to enter   [employer] of Steps: 2 Home Layout: One level Home Equipment: None      Prior Function Level of Independence: Independent                  Extremity/Trunk Assessment   Upper Extremity Assessment Upper Extremity Assessment: Overall WFL for tasks assessed    Lower Extremity Assessment Lower Extremity Assessment: Overall WFL for tasks assessed       Communication   Communication: No difficulties  Cognition Arousal/Alertness: Awake/alert Behavior During Therapy: WFL for tasks assessed/performed Overall Cognitive Status: Within Functional Limits for tasks assessed  General Comments General comments (skin integrity, edema, etc.): Pt with BP at rest 168/115, with seated 167/125 after ambulation 178/110 HR at rest 125bpm with ambulation max HR 132 bpm         Assessment/Plan    PT Assessment Patent does not need any further PT services  PT Problem List Pain        AM-PAC PT "6 Clicks" Mobility  Outcome Measure Help needed turning from your  back to your side while in a flat bed without using bedrails?: None Help needed moving from lying on your back to sitting on the side of a flat bed without using bedrails?: None Help needed moving to and from a bed to a chair (including a wheelchair)?: None Help needed standing up from a chair using your arms (e.g., wheelchair or bedside chair)?: None Help needed to walk in hospital room?: None Help needed climbing 3-5 steps with a railing? : None 6 Click Score: 24    End of Session Equipment Utilized During Treatment: Gait belt Activity Tolerance: Other (comment)(increase in HR) Patient left: in bed;with call bell/phone within reach Nurse Communication: Mobility status PT Visit Diagnosis: Unsteadiness on feet (R26.81);Pain    Time: [id number] PT Time Calculation (min) (ACUTE ONLY): 19 min   Charges:   PT Evaluation $PT Eval Moderate Complexity: 1 Mod          Juan Crawford PT, DPT Acute Rehabilitation Services Pager [phone] Office [phone]   Juan Crawford [facility] 12/23/2018, 4:35 PM

## 2018-12-23 NOTE — TOC Initial Note (Signed)
Transition of Care [facility]) - Initial/Assessment Note    Patient Details  Name: Juan Crawford MRN: [id number] Date of Birth: Jun 17, 1981  Transition of Care [facility]) CM/SW Contact:    Mearl Latin, LCSW Phone Number: 12/23/2018, 4:43 PM  Clinical Narrative:                 CSW received consult regarding ETOH use. CSW spoke with patient. He reported that he has had an issue with over-using alcohol, especially since he lives alone and has no one to tell him not to. He states he had planned to stop drinking but then the COVID outbreak occurred and he thought it was pointless. He states he believes it has been helpful for him to be in the hospital since he has not thought about drinking or smoking. He states he will be staying with his sister upon discharge and returning to work for a [employer] in [place]. He expressed appreciation for CSW's time but politely declined ETOH resources. CSW signing off.   Expected Discharge Plan: Home/Self Care Barriers to Discharge: Continued Medical Work up   Patient Goals and CMS Choice     Choice offered to / list presented to : NA  Expected Discharge Plan and Services Expected Discharge Plan: Home/Self Care In-house Referral: Clinical Social Work                                            Prior Living Arrangements/Services   Lives with:: Self Patient language and need for interpreter reviewed:: Yes Do you feel safe going back to the place where you live?: Yes      Need for Family Participation in Patient Care: No (Comment) Care giver support system in place?: Yes (comment)   Criminal Activity/Legal Involvement Pertinent to Current Situation/Hospitalization: No - Comment as needed  Activities of Daily Living      Permission Sought/Granted                  Emotional Assessment Appearance:: Appears stated age Attitude/Demeanor/Rapport: Gracious, Engaged Affect (typically observed): Accepting, Appropriate,  Pleasant Orientation: : Oriented to Self, Oriented to Place, Oriented to  Time, Oriented to Situation Alcohol / Substance Use: Alcohol Use Psych Involvement: No (comment)  Admission diagnosis:  Tachycardia [R00.0] Alcohol withdrawal syndrome without complication (HCC) [F10.230] Closed fracture of jaw, initial encounter [facility]) [S02.609A] Patient Active Problem List   Diagnosis Date Noted  . Alcohol withdrawal (HCC) 12/19/2018  . Bilateral fracture of mandible, closed, initial encounter (HCC) 12/19/2018  . Hypertensive urgency 12/19/2018  . Closed jaw fracture (HCC)   . Tachycardia    PCP:  Patient, No Pcp Per Pharmacy:   [id number] [place] [place] - [address] AT [facility] [address] [place] [address] Phone: [phone] Fax: [phone]     Social Determinants of Health (SDOH) Interventions    Readmission Risk Interventions No flowsheet data found.

## 2018-12-23 NOTE — Progress Notes (Signed)
Patient ID: Juan Crawford, male   DOB: 03/13/1981, 38 y.o.   MRN: [id number]  PROGRESS NOTE    Juan Crawford  WUJ:[id number] DOB: 03/23/1981 DOA: 12/19/2018 PCP: Patient, No Pcp Per   Brief Narrative:  38 year old male with history of alcoholism presented on 12/19/2018 with a fall and jaw injury.  He was found to have mandibular fracture.  ENT was consulted who recommended medical admission because of features of alcohol withdrawal.  He underwent ORIF of mandible fractures on 12/21/2018.  Assessment & Plan:   Principal Problem:   Alcohol withdrawal (HCC) Active Problems:   Bilateral fracture of mandible, closed, initial encounter [facility](HCC)   Hypertensive urgency  Mandibular fracture status post fall -ENT following.  Status post ORIF of mandible fractures on 12/21/2018.  Continue pain management.  Follow further ENT recommendations.  Diet as per ENT recommendations.  ENT recommends clindamycin for 10 days. -Fall precautions  Fever -May be reactive after surgery.  Will rule out any infectious source.  Blood cultures, urinalysis and urine culture along with chest x-ray.  Clindamycin as above.  Leukocytosis -Probably reactive.  Improving  Alcohol dependence with withdrawal -Currently still hypertensive and tachycardic.  Continue as needed antihypertensives.  We will also start oral liquid form of antihypertensives. -Continue CIWA protocol -Continue thiamine and folic acid -Counseled about alcohol cessation -We will request social worker to give resources for outpatient programs.  Hypertensive urgency -Blood pressure is still extremely elevated.  Will start using oral antihypertensives. -Continue PRN IV antihypertensives  Sinus tachycardia -Probably from alcohol withdrawal, dehydration and pain -Monitor.   DVT prophylaxis: SCDs Code Status: Full Family Communication: None at bedside Disposition Plan: Depends on clinical outcome Consultants: ENT  Procedures: ORIF of  mandibular fractures on 12/21/2018  Antimicrobials: None   Subjective: Patient seen and examined at bedside.  Patient is having fevers.  Still complains of facial and jaw pain.  No overnight nausea, vomiting, diarrhea or shortness of breath. Objective: Vitals:   12/22/18 2030 12/22/18 2348 12/23/18 0456 12/23/18 0735  BP: (!) 165/111 (!) 169/116  (!) 156/108  Pulse:      Resp: (!) 21   (!) 21  Temp: 99.6 F (37.6 C) 98.2 F (36.8 C) (!) 101.2 F (38.4 C) (!) 100.9 F (38.3 C)  TempSrc: Axillary Oral Rectal Axillary  SpO2: 92%   93%  Weight:      Height:        Intake/Output Summary (Last 24 hours) at 12/23/2018 0744 Last data filed at 12/23/2018 0631 Gross per 24 hour  Intake 2034.08 ml  Output 850 ml  Net 1184.08 ml   Filed Weights   12/19/18 1555 12/19/18 2224  Weight: 88.5 kg 102.1 kg    Examination:  General exam: Appears anxious.  Looks older than stated age.  Poor historian.  No acute distress ENT/face: Face and mandible swollen Respiratory system: Bilateral decreased breath sounds at bases with some scattered crackles.  No wheezing Cardiovascular system: S1 & S2 heard, tachycardic gastrointestinal system: Abdomen is nondistended, soft and nontender. Normal bowel sounds heard. Extremities: No cyanosis, edema    Data Reviewed: I have personally reviewed following labs and imaging studies  CBC: Recent Labs  Lab 12/19/18 1618 12/20/18 0247 12/22/18 0335 12/23/18 0330  WBC 18.5* 13.8* 17.0* 14.0*  NEUTROABS 16.5* 11.5* 14.6* 11.3*  HGB 17.7* 14.6 15.5 14.4  HCT 50.3 41.3 44.8 42.6  MCV 96.4 97.9 99.3 99.5  PLT 203 149* 160 183   Basic Metabolic Panel: Recent Labs  Lab 12/19/18 1618 12/20/18 0247 12/22/18 0335 12/23/18 0330  NA 139 135 139 137  K 3.6 3.4* 4.2 3.7  CL 103 103 103 104  CO2 20* GLUCOSE 132* 117* 108* 121*  BUN 6 5* 8 7  CREATININE 0.98 0.83 0.84 0.81  CALCIUM 9.8 8.7* 9.3 8.6*  MG  --  2.1 2.4 2.2   GFR: Estimated  Creatinine Clearance: 158.4 mL/min (by C-G formula based on SCr of 0.81 mg/dL). Liver Function Tests: Recent Labs  Lab 12/19/18 1618 12/20/18 0247 12/22/18 0335 12/23/18 0330  AST 57* 34 24 27  ALT 79* 56* 35 33  ALKPHOS 66 54 58 52  BILITOT 1.5* 1.6* 0.8 0.9  PROT 7.8 6.2* 6.8 6.2*  ALBUMIN 4.3 3.4* 3.3* 2.9*   No results for input(s): LIPASE, AMYLASE in the last 168 hours. No results for input(s): AMMONIA in the last 168 hours. Coagulation Profile: Recent Labs  Lab 12/19/18 1618  INR 1.0   Cardiac Enzymes: No results for input(s): CKTOTAL, CKMB, CKMBINDEX, TROPONINI in the last 168 hours. BNP (last 3 results) No results for input(s): PROBNP in the last 8760 hours. HbA1C: No results for input(s): HGBA1C in the last 72 hours. CBG: No results for input(s): GLUCAP in the last 168 hours. Lipid Profile: No results for input(s): CHOL, HDL, LDLCALC, TRIG, CHOLHDL, LDLDIRECT in the last 72 hours. Thyroid Function Tests: No results for input(s): TSH, T4TOTAL, FREET4, T3FREE, THYROIDAB in the last 72 hours. Anemia Panel: No results for input(s): VITAMINB12, FOLATE, FERRITIN, TIBC, IRON, RETICCTPCT in the last 72 hours. Sepsis Labs: No results for input(s): PROCALCITON, LATICACIDVEN in the last 168 hours.  Recent Results (from the past 240 hour(s))  SARS Coronavirus 2 (CEPHEID - Performed in [facility] hospital lab), Hosp Order     Status: None   Collection Time: 12/19/18  4:27 PM  Result Value Ref Range Status   SARS Coronavirus 2 NEGATIVE NEGATIVE Final    Comment: (NOTE) If result is NEGATIVE SARS-CoV-2 target nucleic acids are NOT DETECTED. The SARS-CoV-2 RNA is generally detectable in upper and lower  respiratory specimens during the acute phase of infection. The lowest  concentration of SARS-CoV-2 viral copies this assay can detect is 250  copies / mL. A negative result does not preclude SARS-CoV-2 infection  and should not be used as the sole basis for treatment or  other  patient management decisions.  A negative result may occur with  improper specimen collection / handling, submission of specimen other  than nasopharyngeal swab, presence of viral mutation(s) within the  areas targeted by this assay, and inadequate number of viral copies  (<250 copies / mL). A negative result must be combined with clinical  observations, patient history, and epidemiological information. If result is POSITIVE SARS-CoV-2 target nucleic acids are DETECTED. The SARS-CoV-2 RNA is generally detectable in upper and lower  respiratory specimens dur ing the acute phase of infection.  Positive  results are indicative of active infection with SARS-CoV-2.  Clinical  correlation with patient history and other diagnostic information is  necessary to determine patient infection status.  Positive results do  not rule out bacterial infection or co-infection with other viruses. If result is PRESUMPTIVE POSTIVE SARS-CoV-2 nucleic acids MAY BE PRESENT.   A presumptive positive result was obtained on the submitted specimen  and confirmed on repeat testing.  While 2019 novel coronavirus  (SARS-CoV-2) nucleic acids may be present in the submitted sample  additional confirmatory testing may be  necessary for epidemiological  and / or clinical management purposes  to differentiate between  SARS-CoV-2 and other Sarbecovirus currently known to infect humans.  If clinically indicated additional testing with an alternate test  methodology [phone]) is advised. The SARS-CoV-2 RNA is generally  detectable in upper and lower respiratory sp ecimens during the acute  phase of infection. The expected result is Negative. Fact Sheet for Patients:  BoilerBrush.com.cy Fact Sheet for Healthcare Providers: https://pope.com/ This test is not yet approved or cleared by the [place] FDA and has been authorized for detection and/or diagnosis of  SARS-CoV-2 by FDA under an Emergency Use Authorization (EUA).  This EUA will remain in effect (meaning this test can be used) for the duration of the COVID-19 declaration under Section 564(b)(1) of the Act, 21 U.S.C. section 360bbb-3(b)(1), unless the authorization is terminated or revoked sooner. Performed at [facility], [address], [place], [place] [address]   Surgical pcr screen     Status: None   Collection Time: 12/20/18 11:20 PM  Result Value Ref Range Status   MRSA, PCR NEGATIVE NEGATIVE Final   Staphylococcus aureus NEGATIVE NEGATIVE Final    Comment: (NOTE) The Xpert SA Assay (FDA approved for NASAL specimens in patients 37 years of age and older), is one component of a comprehensive surveillance program. It is not intended to diagnose infection nor to guide or monitor treatment. Performed at [facility], [address], [place], [place] [address]          Radiology Studies: No results found.      Scheduled Meds: . bacitracin   Topical BID  . chlorhexidine  15 mL Mouth/Throat BID  . folic acid  1 mg Intravenous Daily  . nicotine  14 mg Transdermal Daily  . sodium chloride flush  3 mL Intravenous Q12H  . thiamine injection  100 mg Intravenous Daily   Continuous Infusions: . dextrose 5 % and 0.9% NaCl 100 mL/hr at 12/23/18 0631  . potassium chloride       LOS: 3 days        Glade Lloyd, MD [facility] 12/23/2018, 7:44 AM

## 2018-12-23 NOTE — Plan of Care (Signed)
  Problem: Clinical Measurements: Goal: Ability to maintain clinical measurements within normal limits will improve Outcome: Progressing Goal: Will remain free from infection Outcome: Progressing   Problem: Education: Goal: Knowledge of disease or condition will improve Outcome: Progressing   Problem: Physical Regulation: Goal: Complications related to the disease process, condition or treatment will be avoided or minimized Outcome: Progressing   Problem: Safety: Goal: Ability to remain free from injury will improve Outcome: Progressing

## 2018-12-23 NOTE — Progress Notes (Signed)
Initial Nutrition Assessment  RD working remotely.  DOCUMENTATION CODES:   Not applicable  INTERVENTION:   -15 ml liquid MVI daily -Ensure Enlive po TID, each supplement provides 350 kcal and 20 grams of protein -Provided "Jaw Fracture Nutrition Therapy" handout from [facility] Nutrition Care Manual; pasted text into AVS/discharge summary  NUTRITION DIAGNOSIS:   Increased nutrient needs related to post-op healing as evidenced by estimated needs.  GOAL:   Patient will meet greater than or equal to 90% of their needs  MONITOR:   PO intake, Supplement acceptance, Diet advancement, Labs, Weight trends, Skin, I & O's  REASON FOR ASSESSMENT:   Consult Assessment of nutrition requirement/status  ASSESSMENT:   Juan Crawford is a 38 y.o. male with medical history significant for alcoholism, now presenting to the emergency department for evaluation of severe jaw pain after a fall last night.  Patient reports that he was drinking last night, fell down some stairs, did not lose consciousness, but has been experiencing severe pain around his jaw.  He saw a dentist today for evaluation of this, radiographs demonstrated mandibular fracture, he was then directed to an oral surgeon who sent him to the ED.  Patient reports that he consumes approximately 1 pint of whiskey every night, has been able to maintain employment and does not typically develop any significant withdrawal symptoms during the day, but has been drinking every night for the past 2 years.  His last drink was approximately 10 PM.  He denies any recent fevers or chills, denies chest pain or palpitations, and denies any cough or shortness of breath.  Reports that he was in his usual state before the fall.  Pt admitted with mandibular fracture.   5/24- s/p maxillo-mandibular fixation and ORIF of mandible fracture, facial laceration repair (3 cm wound on chin)  Reviewed I/O's: +1.2 L x 24 hours and -223 ml since admission  UOP: 850  ml x 24 hours  Pt on full liquid diet due to mandibular fracture with fixation. Documented meal completion 0% (last documented 12/19/28).   No wt hx available to assess.   Due to working remotely, difficult to speak with pt to obtain history secondary to mandibular fracture. Pt will need to remain on a blenderized diet (foods that can pass through a straw) for approximately 4 weeks until fixation is removed. Pt would also greatly benefit from addition of oral nutritional supplements.   Medications reviewed and include IV thiamine and folic acid.   Labs reviewed.   NUTRITION - FOCUSED PHYSICAL EXAM:    Most Recent Value  Orbital Region  Unable to assess  Upper Arm Region  Unable to assess  Thoracic and Lumbar Region  Unable to assess  Buccal Region  Unable to assess  Temple Region  Unable to assess  Clavicle Bone Region  Unable to assess  Clavicle and Acromion Bone Region  Unable to assess  Scapular Bone Region  Unable to assess  Dorsal Hand  Unable to assess  Patellar Region  Unable to assess  Anterior Thigh Region  Unable to assess  Posterior Calf Region  Unable to assess  Edema (RD Assessment)  Unable to assess  Hair  Unable to assess  Eyes  Unable to assess  Mouth  Unable to assess  Skin  Unable to assess  Nails  Unable to assess       Diet Order:   Diet Order            Diet full liquid Room service  appropriate? Yes; Fluid consistency: Thin  Diet effective now              EDUCATION NEEDS:   No education needs have been identified at this time  Skin:  Skin Assessment: Skin Integrity Issues: Skin Integrity Issues:: Incisions Incisions: closed lip, closed lt neck  Last BM:  12/22/18  Height:   Ht Readings from Last 1 Encounters:  12/19/18 6\' 1"  (1.854 m)    Weight:   Wt Readings from Last 1 Encounters:  12/19/18 102.1 kg    Ideal Body Weight:  83.6 kg  BMI:  Body mass index is 29.7 kg/m.  Estimated Nutritional Needs:   Kcal:   2100-2300  Protein:  110-25 grams  Fluid:  > 2.1 L    Doria Fern A. Mayford Knife, RD, LDN, CDCES Registered Dietitian II Certified Diabetes Care and Education Specialist Pager: [phone] After hours Pager: [phone]

## 2018-12-24 DIAGNOSIS — Z720 Tobacco use: Secondary | ICD-10-CM

## 2018-12-24 DIAGNOSIS — F10230 Alcohol dependence with withdrawal, uncomplicated: Secondary | ICD-10-CM

## 2018-12-24 LAB — URINE CULTURE: Culture: NO GROWTH

## 2018-12-24 LAB — CBC WITH DIFFERENTIAL/PLATELET
Abs Immature Granulocytes: 0.04 10*3/uL (ref 0.00–0.07)
Basophils Absolute: 0.1 10*3/uL (ref 0.0–0.1)
Basophils Relative: 1 %
Eosinophils Absolute: 0.1 10*3/uL (ref 0.0–0.5)
Eosinophils Relative: 1 %
HCT: 41.9 % (ref 39.0–52.0)
Hemoglobin: 14.5 g/dL (ref 13.0–17.0)
Immature Granulocytes: 0 %
Lymphocytes Relative: 8 %
Lymphs Abs: 1 10*3/uL (ref 0.7–4.0)
MCH: 34 pg (ref 26.0–34.0)
MCHC: 34.6 g/dL (ref 30.0–36.0)
MCV: 98.4 fL (ref 80.0–100.0)
Monocytes Absolute: 1.7 10*3/uL — ABNORMAL HIGH (ref 0.1–1.0)
Monocytes Relative: 13 %
Neutro Abs: 10.4 10*3/uL — ABNORMAL HIGH (ref 1.7–7.7)
Neutrophils Relative %: 77 %
Platelets: 188 10*3/uL (ref 150–400)
RBC: 4.26 MIL/uL (ref 4.22–5.81)
RDW: 12.7 % (ref 11.5–15.5)
WBC: 13.3 10*3/uL — ABNORMAL HIGH (ref 4.0–10.5)
nRBC: 0 % (ref 0.0–0.2)

## 2018-12-24 LAB — COMPREHENSIVE METABOLIC PANEL
ALT: 43 U/L (ref 0–44)
AST: 34 U/L (ref 15–41)
Albumin: 2.9 g/dL — ABNORMAL LOW (ref 3.5–5.0)
Alkaline Phosphatase: 61 U/L (ref 38–126)
Anion gap: 13 (ref 5–15)
BUN: <5 mg/dL — ABNORMAL LOW (ref 6–20)
CO2: 21 mmol/L — ABNORMAL LOW (ref 22–32)
Calcium: 8.8 mg/dL — ABNORMAL LOW (ref 8.9–10.3)
Chloride: 104 mmol/L (ref 98–111)
Creatinine, Ser: 0.68 mg/dL (ref 0.61–1.24)
GFR calc Af Amer: >60 mL/min (ref >60)
GFR calc non Af Amer: >60 mL/min (ref >60)
Glucose, Bld: 101 mg/dL — ABNORMAL HIGH (ref 70–99)
Potassium: 3.4 mmol/L — ABNORMAL LOW (ref 3.5–5.1)
Sodium: 138 mmol/L (ref 135–145)
Total Bilirubin: 0.9 mg/dL (ref 0.3–1.2)
Total Protein: 6.2 g/dL — ABNORMAL LOW (ref 6.5–8.1)

## 2018-12-24 LAB — MAGNESIUM: Magnesium: 2.3 mg/dL (ref 1.7–2.4)

## 2018-12-24 MED ORDER — HYDROCODONE-ACETAMINOPHEN 7.5-325 MG/15ML PO SOLN: 15.0000 mL | Freq: Four times a day (QID) | ORAL | 0 refills | Status: AC | PRN

## 2018-12-24 MED ORDER — CHLORHEXIDINE GLUCONATE 0.12 % MT SOLN: 15.0000 mL | Freq: Two times a day (BID) | OROMUCOSAL | 0 refills | Status: DC

## 2018-12-24 MED ORDER — AMLODIPINE 1 MG/ML ORAL SUSPENSION: 10.0000 mg | Freq: Every day | ORAL | 0 refills | Status: DC

## 2018-12-24 MED ORDER — METOPROLOL TARTRATE 25 MG/10 ML ORAL SUSPENSION: 25.0000 mg | Freq: Two times a day (BID) | ORAL | 0 refills | Status: DC

## 2018-12-24 MED ORDER — ENSURE ENLIVE PO LIQD: 237.0000 mL | Freq: Three times a day (TID) | ORAL | Status: DC

## 2018-12-24 MED ORDER — IBUPROFEN 100 MG/5ML PO SUSP: 400.0000 mg | Freq: Three times a day (TID) | ORAL | 0 refills | Status: DC | PRN

## 2018-12-24 MED ORDER — ADULT MULTIVITAMIN LIQUID CH: 15.0000 mL | Freq: Every day | ORAL | 0 refills | Status: DC

## 2018-12-24 MED ORDER — CLINDAMYCIN PALMITATE HCL 75 MG/5ML PO SOLR: 300.0000 mg | Freq: Four times a day (QID) | ORAL | 0 refills | Status: AC

## 2018-12-24 MED ORDER — NICOTINE 14 MG/24HR TD PT24: 14.0000 mg | MEDICATED_PATCH | Freq: Every day | TRANSDERMAL | 1 refills | Status: DC

## 2018-12-24 NOTE — Progress Notes (Signed)
Juan Crawford to be D/C'd home per MD order. Discussed with the patient and all questions fully answered.   VVS, Skin clean, dry and intact without evidence of skin break down, no evidence of skin tears noted.  IV catheter discontinued intact. Site without signs and symptoms of complications. Dressing and pressure applied.  An After Visit Summary was printed and given to the patient.  Patient escorted via WC, and D/C home via private auto.  Jeralene Peters  12/24/2018 6:43 PM

## 2018-12-24 NOTE — TOC Transition Note (Addendum)
Transition of Care [name]) - CM/SW Discharge Note   Patient Details  Name: Juan Crawford MRN: [id number] Date of Birth: [birth year]  [facility] CM/SW Contact:  Epifanio Lesches, RN Phone Number: 12/24/2018, 2:02 PM   Clinical Narrative:    Transition to home with sister. Pt declined substance abuse resources. States was in process of ETOH abstinence PTA. Family very supportive. Juan Crawford (Mother)  Pt's cell #    [id number]  [phone]          Pt without PCP.. NCM provided pt with [facility] information. Pt very interested ... NCM scheduled hospital f/u for 6/ 09/2018 at 3:30 pm with Tawanna Cooler NP [facility]. Pt states has transportation to home.   Barriers to Discharge: Continued Medical Work up   Patient Goals and CMS Choice     Choice offered to / list presented to : NA  Discharge Placement                       Discharge Plan and Services In-house Referral: Clinical Social Work                                   Social Determinants of Health (SDOH) Interventions     Readmission Risk Interventions No flowsheet data found.

## 2018-12-24 NOTE — Discharge Summary (Signed)
Physician Discharge Summary  Juan Crawford WUJ:[id number] DOB: Feb 05, 1981 DOA: 12/19/2018  PCP: Patient, No Pcp Per  Admit date: 12/19/2018 Discharge date: 12/24/2018  Time spent: 35 minutes  Recommendations for Outpatient Follow-up:  1. Repeat basic metabolic panel to follow electrolytes and renal function 2. Reassess blood pressure and further adjust antihypertensive regimen 3. Continue close outpatient follow-up with all their assistance tolerating alcohol/tobacco cessation.   Discharge Diagnoses:    Alcohol withdrawal (HCC)   Bilateral fracture of mandible, closed  (HCC)   Hypertensive urgency   Tobacco abuse   Discharge Condition: Stable and improved.  Patient discharged home with instruction to establish care with a primary care service and to follow-up in 1 week with ENT (Dr. Jenne Pane).  Diet recommendation: Heart healthy diet.  Filed Weights   12/19/18 1555 12/19/18 2224  Weight: 88.5 kg 102.1 kg    History of present illness:  As per H&P written by Dr. Antionette Char on 12/19/2018 38 y.o. male with medical history significant for alcoholism, now presenting to the emergency department for evaluation of severe jaw pain after a fall last night.  Patient reports that he was drinking last night, fell down some stairs, did not lose consciousness, but has been experiencing severe pain around his jaw.  He saw a dentist today for evaluation of this, radiographs demonstrated mandibular fracture, he was then directed to an oral surgeon who sent him to the ED.  Patient reports that he consumes approximately 1 pint of whiskey every night, has been able to maintain employment and does not typically develop any significant withdrawal symptoms during the day, but has been drinking every night for the past 2 years.  His last drink was approximately 10 PM.  He denies any recent fevers or chills, denies chest pain or palpitations, and denies any cough or shortness of breath.  Reports that he was in his  usual state before the fall.  ED Course: Upon arrival to the ED, patient is found to be afebrile, saturating mid 90s on room air, slightly tachypneic, tachycardic in the 160s, and hypertensive to 190/120.  EKG features sinus tachycardia with rate 163 and QTc interval of 506 ms.  Chemistry panel is notable for slight elevation in transaminases and bilirubin and CBC features a leukocytosis to 18,500.  INR is normal and urinalysis notable for ketonuria.  Patient was given a liter of normal saline, banana bag, IV Ativan, and fentanyl in the ED.  ENT was consulted by the ED physician and recommended medical admission.  Hospital Course:  1-mandible fracture status post fall -Status post ORIF of mandible fractures on 12/21/2018 -Appreciate ENT evaluation, intervention and recommendations. -Complete antibiotic therapy for a total of 10 days -Outpatient follow-up with ENT in 1 week -Continue pain medications. -Will remain with maxillomandibular fixation for 4 weeks.  2-alcohol withdrawal -No complications -Cessation counseling has been provided -Social work has provided outpatient resources to cease with alcohol quitting. -CIWA score 1-2 at discharge  -Patient discharged on liquid multivitamin to supplement thiamine and folic acid.  3-fever and leukocytosis -Most likely reactive and associated with mandibular infection -Continue clindamycin as mentioned above. -Patient without fever for over 36 hours prior to discharge.  4-hypertensive urgency -Associated with alcohol withdrawal -Resolved and well-controlled -Patient discharged on Lopressor and Norvasc. -Advised to quit alcohol usage and to follow heart healthy diet.  5-tobacco abuse: -Cessation counseling has been provided -Nicotine patch has been ordered at discharge.  Procedures:  Status post ORIF mandible fracture. MMF and chin laceration closure.  Consultations:  ENT  Discharge Exam: Vitals:   12/24/18 1035 12/24/18 1254   BP: 129/85 (!) 160/107  Pulse: (!) 106 (!) 115  Resp:    Temp: 97.9 F (36.6 C) 98.6 F (37 C)  SpO2:      General: Afebrile, no chest pain, no nausea, no vomiting.  Still reports pain in his jaw.  Left side jaw swelling appreciated. Cardiovascular: S1 and S2, no rubs, no gallops, no murmurs.  No JVD on exam. Respiratory: Clear to auscultation bilaterally.  Not Using accessory muscles.  Normal respiratory effort. Abdomen: Soft, nontender: Distended, positive bowel sounds. Extremities: No edema, no cyanosis, no clubbing.  Discharge Instructions   Discharge Instructions    Diet - low sodium heart healthy   Complete by:  As directed    Discharge instructions   Complete by:  As directed    Take medications as prescribed Follow-up with ENT physician as instructed Maintain adequate hydration Use feeding supplement 3 times a day as recommended Avoid the use of alcohol and to stop smoking.     Allergies as of 12/24/2018   No Known Allergies     Medication List    TAKE these medications   amLODipine 1 mg/mL Susp oral suspension Commonly known as:  NORVASC Take 10 mLs (10 mg total) by mouth daily. Start taking on:  Dec 25, 2018   chlorhexidine 0.12 % solution Commonly known as:  PERIDEX Use as directed 15 mLs in the mouth or throat 2 (two) times daily.   clindamycin 75 MG/5ML solution Commonly known as:  CLEOCIN Take 20 mLs (300 mg total) by mouth every 6 (six) hours for 9 days.   feeding supplement (ENSURE ENLIVE) Liqd Take 237 mLs by mouth 3 (three) times daily between meals.   HYDROcodone-acetaminophen 7.5-325 mg/15 ml solution Commonly known as:  HYCET Take 15 mLs by mouth every 6 (six) hours as needed for up to 7 days for severe pain.   ibuprofen 100 MG/5ML suspension Commonly known as:  ADVIL Take 20 mLs (400 mg total) by mouth every 8 (eight) hours as needed for fever or moderate pain.   metoprolol tartrate 25 mg/10 mL Susp Commonly known as:   LOPRESSOR Take 10 mLs (25 mg total) by mouth 2 (two) times daily.   multivitamin Liqd Take 15 mLs by mouth daily. Start taking on:  Dec 25, 2018   nicotine 14 mg/24hr patch Commonly known as:  NICODERM CQ - dosed in mg/24 hours Place 1 patch (14 mg total) onto the skin daily. Start taking on:  Dec 25, 2018      No Known Allergies Follow-up Information    Christia ReadingBates, Dwight, MD. Schedule an appointment as soon as possible for a visit in 1 week(s).   Specialty:  Otolaryngology Contact information: [address] [place] [address] [phone]        [name] Follow up.   Contact information: [address] [place] [phone]          The results of significant diagnostics from this hospitalization (including imaging, microbiology, ancillary and laboratory) are listed below for reference.    Significant Diagnostic Studies: Ct Head Wo Contrast  Result Date: 12/19/2018 CLINICAL DATA:  Fall down stairs. EXAM: CT HEAD WITHOUT CONTRAST CT MAXILLOFACIAL WITHOUT CONTRAST CT CERVICAL SPINE WITHOUT CONTRAST TECHNIQUE: Multidetector CT imaging of the head, cervical spine, and maxillofacial structures were performed using the standard protocol without intravenous contrast. Multiplanar CT  image reconstructions of the cervical spine and maxillofacial structures were also generated. COMPARISON:  None. FINDINGS: CT HEAD FINDINGS Brain: There is no mass, hemorrhage or extra-axial collection. The size and configuration of the ventricles and extra-axial CSF spaces are normal. The brain parenchyma is normal, without evidence of acute or chronic infarction. Vascular: No abnormal hyperdensity of the major intracranial arteries or dural venous sinuses. No intracranial atherosclerosis. Skull: The visualized skull base, calvarium and extracranial soft tissues are normal. CT MAXILLOFACIAL FINDINGS Osseous: --Complex  facial fracture types: No LeFort, zygomaticomaxillary complex or nasoorbitoethmoidal fracture. --Simple fracture types: None. --Mandible: Comminuted fracture of the mandible traversing the right mandibular body at the root of tooth 27 and the left mandibular angle across the roots of teeth 16 and 17. Temporal mandibular joints remain approximated. Orbits: The globes are intact. Normal appearance of the intra- and extraconal fat. Symmetric extraocular muscles and optic nerves. Sinuses: No fluid levels or advanced mucosal thickening. Soft tissues: Hematoma surrounding the left mandible with left facial abrasion. CT CERVICAL SPINE FINDINGS Alignment: No static subluxation. Facets are aligned. Occipital condyles and the lateral masses of C1-C2 are aligned. Skull base and vertebrae: No acute fracture. Soft tissues and spinal canal: No prevertebral fluid or swelling. No visible canal hematoma. Disc levels: No advanced spinal canal or neural foraminal stenosis. Upper chest: No pneumothorax, pulmonary nodule or pleural effusion. Other: Normal visualized paraspinal cervical soft tissues. IMPRESSION: 1. No acute intracranial abnormality. 2. Comminuted mandibular fracture extending from the right mandibular body to the left mandibular angle and involving the roots of teeth 16, 17 in 27. 3. No fracture or static subluxation of the cervical spine. Electronically Signed   By: Deatra Robinson M.D.   On: 12/19/2018 19:21   Ct Cervical Spine Wo Contrast  Result Date: 12/19/2018 CLINICAL DATA:  Fall down stairs. EXAM: CT HEAD WITHOUT CONTRAST CT MAXILLOFACIAL WITHOUT CONTRAST CT CERVICAL SPINE WITHOUT CONTRAST TECHNIQUE: Multidetector CT imaging of the head, cervical spine, and maxillofacial structures were performed using the standard protocol without intravenous contrast. Multiplanar CT image reconstructions of the cervical spine and maxillofacial structures were also generated. COMPARISON:  None. FINDINGS: CT HEAD FINDINGS  Brain: There is no mass, hemorrhage or extra-axial collection. The size and configuration of the ventricles and extra-axial CSF spaces are normal. The brain parenchyma is normal, without evidence of acute or chronic infarction. Vascular: No abnormal hyperdensity of the major intracranial arteries or dural venous sinuses. No intracranial atherosclerosis. Skull: The visualized skull base, calvarium and extracranial soft tissues are normal. CT MAXILLOFACIAL FINDINGS Osseous: --Complex facial fracture types: No LeFort, zygomaticomaxillary complex or nasoorbitoethmoidal fracture. --Simple fracture types: None. --Mandible: Comminuted fracture of the mandible traversing the right mandibular body at the root of tooth 27 and the left mandibular angle across the roots of teeth 16 and 17. Temporal mandibular joints remain approximated. Orbits: The globes are intact. Normal appearance of the intra- and extraconal fat. Symmetric extraocular muscles and optic nerves. Sinuses: No fluid levels or advanced mucosal thickening. Soft tissues: Hematoma surrounding the left mandible with left facial abrasion. CT CERVICAL SPINE FINDINGS Alignment: No static subluxation. Facets are aligned. Occipital condyles and the lateral masses of C1-C2 are aligned. Skull base and vertebrae: No acute fracture. Soft tissues and spinal canal: No prevertebral fluid or swelling. No visible canal hematoma. Disc levels: No advanced spinal canal or neural foraminal stenosis. Upper chest: No pneumothorax, pulmonary nodule or pleural effusion. Other: Normal visualized paraspinal cervical soft tissues. IMPRESSION: 1. No acute intracranial abnormality. 2.  Comminuted mandibular fracture extending from the right mandibular body to the left mandibular angle and involving the roots of teeth 16, 17 in 27. 3. No fracture or static subluxation of the cervical spine. Electronically Signed   By: Deatra Robinson M.D.   On: 12/19/2018 19:21   Dg Chest Port 1 View  Result  Date: 12/23/2018 CLINICAL DATA:  Cough, fever, nasal congestion EXAM: PORTABLE CHEST 1 VIEW COMPARISON:  None. FINDINGS: Heart and mediastinal contours are within normal limits. No focal opacities or effusions. No acute bony abnormality. IMPRESSION: No active disease. Electronically Signed   By: Charlett Nose M.D.   On: 12/23/2018 09:34   Ct Maxillofacial Wo Cm  Result Date: 12/19/2018 CLINICAL DATA:  Fall down stairs. EXAM: CT HEAD WITHOUT CONTRAST CT MAXILLOFACIAL WITHOUT CONTRAST CT CERVICAL SPINE WITHOUT CONTRAST TECHNIQUE: Multidetector CT imaging of the head, cervical spine, and maxillofacial structures were performed using the standard protocol without intravenous contrast. Multiplanar CT image reconstructions of the cervical spine and maxillofacial structures were also generated. COMPARISON:  None. FINDINGS: CT HEAD FINDINGS Brain: There is no mass, hemorrhage or extra-axial collection. The size and configuration of the ventricles and extra-axial CSF spaces are normal. The brain parenchyma is normal, without evidence of acute or chronic infarction. Vascular: No abnormal hyperdensity of the major intracranial arteries or dural venous sinuses. No intracranial atherosclerosis. Skull: The visualized skull base, calvarium and extracranial soft tissues are normal. CT MAXILLOFACIAL FINDINGS Osseous: --Complex facial fracture types: No LeFort, zygomaticomaxillary complex or nasoorbitoethmoidal fracture. --Simple fracture types: None. --Mandible: Comminuted fracture of the mandible traversing the right mandibular body at the root of tooth 27 and the left mandibular angle across the roots of teeth 16 and 17. Temporal mandibular joints remain approximated. Orbits: The globes are intact. Normal appearance of the intra- and extraconal fat. Symmetric extraocular muscles and optic nerves. Sinuses: No fluid levels or advanced mucosal thickening. Soft tissues: Hematoma surrounding the left mandible with left facial  abrasion. CT CERVICAL SPINE FINDINGS Alignment: No static subluxation. Facets are aligned. Occipital condyles and the lateral masses of C1-C2 are aligned. Skull base and vertebrae: No acute fracture. Soft tissues and spinal canal: No prevertebral fluid or swelling. No visible canal hematoma. Disc levels: No advanced spinal canal or neural foraminal stenosis. Upper chest: No pneumothorax, pulmonary nodule or pleural effusion. Other: Normal visualized paraspinal cervical soft tissues. IMPRESSION: 1. No acute intracranial abnormality. 2. Comminuted mandibular fracture extending from the right mandibular body to the left mandibular angle and involving the roots of teeth 16, 17 in 27. 3. No fracture or static subluxation of the cervical spine. Electronically Signed   By: Deatra Robinson M.D.   On: 12/19/2018 19:21    Microbiology: Recent Results (from the past 240 hour(s))  SARS Coronavirus 2 (CEPHEID - Performed in [facility] hospital lab), Hosp Order     Status: None   Collection Time: 12/19/18  4:27 PM  Result Value Ref Range Status   SARS Coronavirus 2 NEGATIVE NEGATIVE Final    Comment: (NOTE) If result is NEGATIVE SARS-CoV-2 target nucleic acids are NOT DETECTED. The SARS-CoV-2 RNA is generally detectable in upper and lower  respiratory specimens during the acute phase of infection. The lowest  concentration of SARS-CoV-2 viral copies this assay can detect is 250  copies / mL. A negative result does not preclude SARS-CoV-2 infection  and should not be used as the sole basis for treatment or other  patient management decisions.  A negative result may occur with  improper specimen collection / handling, submission of specimen other  than nasopharyngeal swab, presence of viral mutation(s) within the  areas targeted by this assay, and inadequate number of viral copies  (<250 copies / mL). A negative result must be combined with clinical  observations, patient history, and epidemiological  information. If result is POSITIVE SARS-CoV-2 target nucleic acids are DETECTED. The SARS-CoV-2 RNA is generally detectable in upper and lower  respiratory specimens dur ing the acute phase of infection.  Positive  results are indicative of active infection with SARS-CoV-2.  Clinical  correlation with patient history and other diagnostic information is  necessary to determine patient infection status.  Positive results do  not rule out bacterial infection or co-infection with other viruses. If result is PRESUMPTIVE POSTIVE SARS-CoV-2 nucleic acids MAY BE PRESENT.   A presumptive positive result was obtained on the submitted specimen  and confirmed on repeat testing.  While 2019 novel coronavirus  (SARS-CoV-2) nucleic acids may be present in the submitted sample  additional confirmatory testing may be necessary for epidemiological  and / or clinical management purposes  to differentiate between  SARS-CoV-2 and other Sarbecovirus currently known to infect humans.  If clinically indicated additional testing with an alternate test  methodology [phone]) is advised. The SARS-CoV-2 RNA is generally  detectable in upper and lower respiratory sp ecimens during the acute  phase of infection. The expected result is Negative. Fact Sheet for Patients:  BoilerBrush.com.cy Fact Sheet for Healthcare Providers: https://pope.com/ This test is not yet approved or cleared by the [place] FDA and has been authorized for detection and/or diagnosis of SARS-CoV-2 by FDA under an Emergency Use Authorization (EUA).  This EUA will remain in effect (meaning this test can be used) for the duration of the COVID-19 declaration under Section 564(b)(1) of the Act, 21 U.S.C. section 360bbb-3(b)(1), unless the authorization is terminated or revoked sooner. Performed at [facility], [address] [place], [place] [address]   Surgical pcr screen      Status: None   Collection Time: 12/20/18 11:20 PM  Result Value Ref Range Status   MRSA, PCR NEGATIVE NEGATIVE Final   Staphylococcus aureus NEGATIVE NEGATIVE Final    Comment: (NOTE) The Xpert SA Assay (FDA approved for NASAL specimens in patients 21 years of age and older), is one component of a comprehensive surveillance program. It is not intended to diagnose infection nor to guide or monitor treatment. Performed at [facility], [address], [place], [place] [address]   Culture, blood (routine x 2)     Status: None (Preliminary result)   Collection Time: 12/23/18  8:34 AM  Result Value Ref Range Status   Specimen Description BLOOD LEFT ANTECUBITAL  Final   Special Requests   Final    BOTTLES DRAWN AEROBIC AND ANAEROBIC Blood Culture adequate volume   Culture   Final    NO GROWTH 1 DAY Performed at [facility], [address], [place], [place] [address]    Report Status PENDING  Incomplete  Culture, blood (routine x 2)     Status: None (Preliminary result)   Collection Time: 12/23/18  8:44 AM  Result Value Ref Range Status   Specimen Description BLOOD LEFT HAND  Final   Special Requests   Final    BOTTLES DRAWN AEROBIC AND ANAEROBIC Blood Culture adequate volume   Culture   Final    NO GROWTH 1 DAY Performed at [facility], [address], [place], [place] [address]    Report Status PENDING  Incomplete  Culture, Urine     Status: None   Collection Time: 12/23/18  4:21 PM  Result Value Ref Range Status   Specimen Description URINE, CLEAN CATCH  Final   Special Requests NONE  Final   Culture   Final    NO GROWTH Performed at [facility], [address] [place], [place] [address]    Report Status 12/24/2018 FINAL  Final     Labs: Basic Metabolic Panel: Recent Labs  Lab 12/19/18 1618 12/20/18 0247 12/22/18 0335 12/23/18 0330 12/24/18 0248  NA 139 135 139 137 138  K 3.6 3.4* 4.2 3.7 3.4*  CL 103 103 103 104 104  CO2 20* 21*  GLUCOSE 132* 117* 108* 121* 101*  BUN 6 5* 8 7 <5*  CREATININE 0.98 0.83 0.84 0.81 0.68  CALCIUM 9.8 8.7* 9.3 8.6* 8.8*  MG  --  2.1 2.4 2.2 2.3   Liver Function Tests: Recent Labs  Lab 12/19/18 1618 12/20/18 0247 12/22/18 0335 12/23/18 0330 12/24/18 0248  AST 57* 34 24 27 34  ALT 79* 56* 35 33 43  ALKPHOS 66 54 58 52 61  BILITOT 1.5* 1.6* 0.8 0.9 0.9  PROT 7.8 6.2* 6.8 6.2* 6.2*  ALBUMIN 4.3 3.4* 3.3* 2.9* 2.9*   CBC: Recent Labs  Lab 12/19/18 1618 12/20/18 0247 12/22/18 0335 12/23/18 0330 12/24/18 0248  WBC 18.5* 13.8* 17.0* 14.0* 13.3*  NEUTROABS 16.5* 11.5* 14.6* 11.3* 10.4*  HGB 17.7* 14.6 15.5 14.4 14.5  HCT 50.3 41.3 44.8 42.6 41.9  MCV 96.4 97.9 99.3 99.5 98.4  PLT 203 149* 160 183 188     Signed:  Vassie Loll MD.  [facility] 12/24/2018, 3:26 PM

## 2018-12-28 LAB — CULTURE, BLOOD (ROUTINE X 2)
Culture: NO GROWTH
Culture: NO GROWTH
Special Requests: ADEQUATE
Special Requests: ADEQUATE

## 2018-12-30 NOTE — Anesthesia Postprocedure Evaluation (Signed)
Anesthesia Post Note  Patient: Juan Crawford  Procedure(s) Performed: MAXILLO-MANDIBULAR FIXATION AND ORIF OF MANDIBLE FRACTURE (N/A Head) Facial Laceration Repair (N/A Chin)     Patient location during evaluation: PACU Anesthesia Type: General Level of consciousness: awake and alert Pain management: pain level controlled Vital Signs Assessment: post-procedure vital signs reviewed and stable Respiratory status: spontaneous breathing, nonlabored ventilation, respiratory function stable and patient connected to nasal cannula oxygen Cardiovascular status: blood pressure returned to baseline and stable Postop Assessment: no apparent nausea or vomiting Anesthetic complications: no    Last Vitals:  Vitals:   12/24/18 1035 12/24/18 1254  BP: 129/85 (!) 160/107  Pulse: (!) 106 (!) 115  Resp:    Temp: 36.6 C 37 C  SpO2:      Last Pain:  Vitals:   12/24/18 1254  TempSrc: Axillary  PainSc:                  Johnthomas Lader

## 2018-12-31 ENCOUNTER — Ambulatory Visit: Payer: 59 | Attending: Nurse Practitioner | Admitting: Nurse Practitioner

## 2018-12-31 ENCOUNTER — Other Ambulatory Visit: Payer: Self-pay

## 2018-12-31 ENCOUNTER — Encounter: Payer: Self-pay | Admitting: Nurse Practitioner

## 2018-12-31 DIAGNOSIS — I10 Essential (primary) hypertension: Secondary | ICD-10-CM | POA: Diagnosis not present

## 2018-12-31 DIAGNOSIS — Z09 Encounter for follow-up examination after completed treatment for conditions other than malignant neoplasm: Secondary | ICD-10-CM | POA: Diagnosis not present

## 2018-12-31 DIAGNOSIS — F17200 Nicotine dependence, unspecified, uncomplicated: Secondary | ICD-10-CM

## 2018-12-31 NOTE — Progress Notes (Signed)
Virtual Visit via Telephone Note Due to national recommendations of social distancing due to COVID 19, telehealth visit is felt to be most appropriate for this patient at this time.  I discussed the limitations, risks, security and privacy concerns of performing an evaluation and management service by telephone and the availability of in person appointments. I also discussed with the patient that there may be a patient responsible charge related to this service. The patient expressed understanding and agreed to proceed.    I connected with Juan Crawford on 12/31/18  at   3:30 PM EDT  EDT by telephone and verified that I am speaking with the correct person using two identifiers.   Consent I discussed the limitations, risks, security and privacy concerns of performing an evaluation and management service by telephone and the availability of in person appointments. I also discussed with the patient that there may be a patient responsible charge related to this service. The patient expressed understanding and agreed to proceed.   Location of Patient: Private  Residence   Location of Provider: [facility]    Persons participating in Telemedicine visit: Juan Denver FNP-BC YY Winslow CMA Juan Crawford    History of Present Illness: Telemedicine visit for: Establish care and Hospital Follow UP Currently being followed by [facility] ENT for : Wound infection after surgery (Primary Dx);  Open fracture of right side of mandibular body with routine healing, subsequent encounter;  Open fracture of left mandibular angle with routine healing, subsequent encounter. PER ENT NOTE 12-29-2018  - The left neck wound is infected and was opened in the office setting with placement of a Penrose drain. He continues on oral clindamycin and will change a dry dressing over the drain. He will follow-up at the end of the week.  He has a past medical history of Hypertension.  HFU He  was admitted to the hospital on 12/19/2018 and discharged on 12/24/2018 with discharge diagnoses: Alcohol withdrawal, bilateral fracture of mandible; closed, hypertensive urgency.  Required ORIF of mandible fracture on 12/21/2018 with MMF and chin laceration closure. Unfortunately patient had been drinking, fell down some stairs and ultimately fractured his jaw.  Reportedly drinks pint of whiskey every night and has been drinking this way for the past 2 years.  He was discharged home on Lopressor and Norvasc for hyper urgency, ENT referral was placed and patient was instructed to stop smoking and nicotine patch was given upon discharge.    Blood Pressure Monitoring blood pressure at home. 134/90 and 143/99.  He has been unable to obtain Amlodipine suspension due to some insurance issues. I have been in contact with the pharmacy to help assist patient with obtaining his medications. He is currently taking metoprolol 25mg  suspension BID as prescribed. Denies chest pain, shortness of breath, palpitations, lightheadedness, dizziness, headaches or BLE edema.  BP Readings from Last 3 Encounters:  12/24/18 (!) 160/107     Past Medical History:  Diagnosis Date  . Hypertension   . Nicotine dependence     Past Surgical History:  Procedure Laterality Date  . FACIAL LACERATION REPAIR N/A 12/21/2018   Procedure: Facial Laceration Repair;  Surgeon: Christia Reading, MD;  Location: [facility] OR;  Service: ENT;  Laterality: N/A;  3cm wound on chin  . HAND SURGERY  2001  . ORIF MANDIBULAR FRACTURE N/A 12/21/2018   Procedure: MAXILLO-MANDIBULAR FIXATION AND ORIF OF MANDIBLE FRACTURE;  Surgeon: Christia Reading, MD;  Location: [facility] OR;  Service: ENT;  Laterality: N/A;  Family History  Problem Relation Age of Onset  . Diabetes Neg Hx     Social History   Socioeconomic History  . Marital status: Single    Spouse name: Not on file  . Number of children: Not on file  . Years of education: Not on file  . Highest  education level: Not on file  Occupational History  . Not on file  Social Needs  . Financial resource strain: Not on file  . Food insecurity:    Worry: Not on file    Inability: Not on file  . Transportation needs:    Medical: Not on file    Non-medical: Not on file  Tobacco Use  . Smoking status: Current Every Day Smoker    Packs/day: 1.00    Years: 20.00    Pack years: 20.00    Types: Cigarettes  . Smokeless tobacco: Never Used  Substance and Sexual Activity  . Alcohol use: Not Currently    Alcohol/week: 6.0 standard drinks    Types: 6 Shots of liquor per week  . Drug use: Not Currently    Frequency: 3.0 times per week    Types: Marijuana  . Sexual activity: Not Currently  Lifestyle  . Physical activity:    Days per week: Not on file    Minutes per session: Not on file  . Stress: Not on file  Relationships  . Social connections:    Talks on phone: Not on file    Gets together: Not on file    Attends religious service: Not on file    Active member of club or organization: Not on file    Attends meetings of clubs or organizations: Not on file    Relationship status: Not on file  Other Topics Concern  . Not on file  Social History Narrative  . Not on file     Observations/Objective: Awake, alert and oriented x 3   Review of Systems  Constitutional: Negative for fever, malaise/fatigue and weight loss.  HENT: Negative.  Negative for nosebleeds.        Mandibular fracture;liquid diet  Eyes: Negative.  Negative for blurred vision, double vision and photophobia.  Respiratory: Negative.  Negative for cough and shortness of breath.   Cardiovascular: Negative.  Negative for chest pain, palpitations and leg swelling.  Gastrointestinal: Negative.  Negative for heartburn, nausea and vomiting.  Musculoskeletal: Negative.  Negative for myalgias.  Neurological: Negative.  Negative for dizziness, focal weakness, seizures and headaches.  Psychiatric/Behavioral: Negative.   Negative for suicidal ideas.    Assessment and Plan: Juan Crawford was evaluated today for hospitalization follow-up.  Diagnoses and all orders for this visit:  Hospital discharge follow-up Follow up for BP recheck Follow up with ENT as schedule  Essential hypertension Continue all antihypertensives as prescribed.  Remember to bring in your blood pressure log with you for your follow up appointment.  DASH/Mediterranean Diets are healthier choices for HTN.    Tobacco dependence Juan Crawford was counseled on the dangers of tobacco use, and was advised to quit. Reviewed strategies to maximize success, including removing cigarettes and smoking materials from environment, stress management and support of family/friends as well as pharmacological alternatives including: Wellbutrin, Chantix, Nicotine patch, Nicotine gum or lozenges. Smoking cessation support: smoking cessation hotline: 1-800-QUIT-NOW.  Smoking cessation classes are also available through [facility]. Call [phone] or visit our website at [email or web address].   A total of 3 minutes was spent on counseling for smoking cessation and Juan Crawford  is not ready to quit.     Follow Up Instructions Return in about 4 weeks (around 01/28/2019) for BP recheck.     I discussed the assessment and treatment plan with the patient. The patient was provided an opportunity to ask questions and all were answered. The patient agreed with the plan and demonstrated an understanding of the instructions.   The patient was advised to call back or seek an in-person evaluation if the symptoms worsen or if the condition fails to improve as anticipated.  I provided 31 minutes of non-face-to-face time during this encounter including median intraservice time, reviewing previous notes, labs, imaging, medication management and explaining diagnosis and management.  Claiborne Rigg, FNP-BC

## 2019-01-01 ENCOUNTER — Encounter: Payer: Self-pay | Admitting: Nurse Practitioner

## 2019-01-05 ENCOUNTER — Encounter: Payer: Self-pay | Admitting: Nurse Practitioner

## 2019-01-07 MED ORDER — IBUPROFEN 100 MG/5ML PO SUSP: 400.0000 mg | Freq: Three times a day (TID) | ORAL | 0 refills | Status: DC | PRN

## 2019-01-07 NOTE — Telephone Encounter (Signed)
I spoke with the pharmacy staff at [facility] who confirmed the patient needed a PA for amlodipine oral suspension which goes under the brand name Lynford Humphrey. PA was submitted and approved, pharmacy and patient have been notified.  Patient also requested refill on ibuprofen which was last written by the hospitalist, pcp ok'd sending a refill via skype.

## 2019-01-11 ENCOUNTER — Other Ambulatory Visit: Payer: Self-pay | Admitting: Nurse Practitioner

## 2019-01-11 MED ORDER — CHLORHEXIDINE GLUCONATE 0.12 % MT SOLN: 15.0000 mL | Freq: Two times a day (BID) | OROMUCOSAL | 0 refills | Status: DC

## 2019-01-15 DIAGNOSIS — I10 Essential (primary) hypertension: Secondary | ICD-10-CM | POA: Diagnosis present

## 2019-01-20 ENCOUNTER — Encounter: Payer: Self-pay | Admitting: Nurse Practitioner

## 2019-01-20 NOTE — Telephone Encounter (Signed)
CMA left patient a VM to inform we can cancel his lab appt. CMA wanted to confirm that he have a new primary care.

## 2019-01-20 NOTE — Telephone Encounter (Signed)
Please address with patient. 

## 2019-01-23 ENCOUNTER — Other Ambulatory Visit: Payer: 59

## 2019-02-06 ENCOUNTER — Other Ambulatory Visit: Payer: Self-pay

## 2019-02-06 ENCOUNTER — Other Ambulatory Visit: Payer: Self-pay | Admitting: Otolaryngology

## 2019-02-06 ENCOUNTER — Encounter (HOSPITAL_BASED_OUTPATIENT_CLINIC_OR_DEPARTMENT_OTHER): Payer: Self-pay | Admitting: *Deleted

## 2019-02-09 ENCOUNTER — Other Ambulatory Visit (HOSPITAL_COMMUNITY)
Admission: RE | Admit: 2019-02-09 | Discharge: 2019-02-09 | Disposition: A | Discharge status: Home / Self Care | Payer: 59 | Source: Ambulatory Visit | Attending: Otolaryngology | Admitting: Otolaryngology

## 2019-02-09 DIAGNOSIS — Z1159 Encounter for screening for other viral diseases: Secondary | ICD-10-CM | POA: Insufficient documentation

## 2019-02-09 LAB — SARS CORONAVIRUS 2 (TAT 6-24 HRS): SARS Coronavirus 2: NEGATIVE

## 2019-02-12 ENCOUNTER — Ambulatory Visit (HOSPITAL_BASED_OUTPATIENT_CLINIC_OR_DEPARTMENT_OTHER)
Admission: RE | Admit: 2019-02-12 | Discharge: 2019-02-12 | Disposition: A | Discharge status: Home / Self Care | Payer: 59 | Attending: Otolaryngology | Admitting: Otolaryngology

## 2019-02-12 ENCOUNTER — Ambulatory Visit (HOSPITAL_BASED_OUTPATIENT_CLINIC_OR_DEPARTMENT_OTHER): Payer: 59 | Admitting: Anesthesiology

## 2019-02-12 ENCOUNTER — Other Ambulatory Visit: Payer: Self-pay

## 2019-02-12 ENCOUNTER — Encounter (HOSPITAL_BASED_OUTPATIENT_CLINIC_OR_DEPARTMENT_OTHER)
Admission: RE | Disposition: A | Discharge status: Home / Self Care | Payer: Self-pay | Source: Home / Self Care | Attending: Otolaryngology

## 2019-02-12 ENCOUNTER — Encounter (HOSPITAL_BASED_OUTPATIENT_CLINIC_OR_DEPARTMENT_OTHER): Payer: Self-pay

## 2019-02-12 DIAGNOSIS — M272 Inflammatory conditions of jaws: Secondary | ICD-10-CM | POA: Diagnosis not present

## 2019-02-12 DIAGNOSIS — T8149XA Infection following a procedure, other surgical site, initial encounter: Secondary | ICD-10-CM | POA: Diagnosis not present

## 2019-02-12 DIAGNOSIS — Z79899 Other long term (current) drug therapy: Secondary | ICD-10-CM | POA: Insufficient documentation

## 2019-02-12 DIAGNOSIS — I10 Essential (primary) hypertension: Secondary | ICD-10-CM | POA: Diagnosis not present

## 2019-02-12 DIAGNOSIS — S0266XD Fracture of symphysis of mandible, subsequent encounter for fracture with routine healing: Secondary | ICD-10-CM | POA: Diagnosis not present

## 2019-02-12 DIAGNOSIS — X58XXXD Exposure to other specified factors, subsequent encounter: Secondary | ICD-10-CM | POA: Diagnosis not present

## 2019-02-12 DIAGNOSIS — B999 Unspecified infectious disease: Secondary | ICD-10-CM | POA: Insufficient documentation

## 2019-02-12 DIAGNOSIS — W109XXA Fall (on) (from) unspecified stairs and steps, initial encounter: Secondary | ICD-10-CM | POA: Insufficient documentation

## 2019-02-12 DIAGNOSIS — F17210 Nicotine dependence, cigarettes, uncomplicated: Secondary | ICD-10-CM | POA: Insufficient documentation

## 2019-02-12 DIAGNOSIS — S0266XA Fracture of symphysis of mandible, initial encounter for closed fracture: Secondary | ICD-10-CM | POA: Diagnosis present

## 2019-02-12 HISTORY — PX: MANDIBULAR HARDWARE REMOVAL: SHX5205

## 2019-02-12 HISTORY — PX: CYST EXCISION: SHX5701

## 2019-02-12 SURGERY — REMOVAL, HARDWARE, MANDIBLE
Anesthesia: General | Site: Neck | Laterality: Left

## 2019-02-12 MED ORDER — MIDAZOLAM HCL 2 MG/2ML IJ SOLN
1.0000 mg | INTRAMUSCULAR | Status: DC | PRN
  Administered 2019-02-12: 11:00:00 2 mg via INTRAVENOUS

## 2019-02-12 MED ORDER — LIDOCAINE-EPINEPHRINE 1 %-1:100000 IJ SOLN
INTRAMUSCULAR | Status: AC
  Filled 2019-02-12: qty 1

## 2019-02-12 MED ORDER — FENTANYL CITRATE (PF) 100 MCG/2ML IJ SOLN
INTRAMUSCULAR | Status: AC
  Filled 2019-02-12: qty 2

## 2019-02-12 MED ORDER — LIDOCAINE 2% (20 MG/ML) 5 ML SYRINGE
INTRAMUSCULAR | Status: AC
  Filled 2019-02-12: qty 5

## 2019-02-12 MED ORDER — DEXAMETHASONE SODIUM PHOSPHATE 10 MG/ML IJ SOLN
INTRAMUSCULAR | Status: AC
  Filled 2019-02-12: qty 1

## 2019-02-12 MED ORDER — LIDOCAINE 2% (20 MG/ML) 5 ML SYRINGE
INTRAMUSCULAR | Status: DC | PRN
  Administered 2019-02-12: 100 mg via INTRAVENOUS

## 2019-02-12 MED ORDER — PROMETHAZINE HCL 25 MG/ML IJ SOLN: 6.2500 mg | INTRAMUSCULAR | Status: DC | PRN

## 2019-02-12 MED ORDER — MEPERIDINE HCL 25 MG/ML IJ SOLN: 6.2500 mg | INTRAMUSCULAR | Status: DC | PRN

## 2019-02-12 MED ORDER — OXYCODONE HCL 5 MG/5ML PO SOLN: 5.0000 mg | Freq: Once | ORAL | Status: AC | PRN

## 2019-02-12 MED ORDER — OXYCODONE HCL 5 MG PO TABS
5.0000 mg | ORAL_TABLET | Freq: Once | ORAL | Status: AC | PRN
  Administered 2019-02-12: 5 mg via ORAL

## 2019-02-12 MED ORDER — LIDOCAINE HCL (CARDIAC) PF 100 MG/5ML IV SOSY
PREFILLED_SYRINGE | INTRAVENOUS | Status: DC | PRN
  Administered 2019-02-12: 100 mg via INTRAVENOUS

## 2019-02-12 MED ORDER — BACITRACIN ZINC 500 UNIT/GM EX OINT
TOPICAL_OINTMENT | CUTANEOUS | Status: AC
  Filled 2019-02-12: qty 0.9

## 2019-02-12 MED ORDER — PROPOFOL 10 MG/ML IV BOLUS
INTRAVENOUS | Status: AC
  Filled 2019-02-12: qty 20

## 2019-02-12 MED ORDER — ONDANSETRON HCL 4 MG/2ML IJ SOLN
INTRAMUSCULAR | Status: AC
  Filled 2019-02-12: qty 2

## 2019-02-12 MED ORDER — LIDOCAINE-EPINEPHRINE 1 %-1:100000 IJ SOLN
INTRAMUSCULAR | Status: DC | PRN
  Administered 2019-02-12: 7 mL

## 2019-02-12 MED ORDER — PROPOFOL 10 MG/ML IV BOLUS
INTRAVENOUS | Status: DC | PRN
  Administered 2019-02-12 (×2): 50 mg via INTRAVENOUS
  Administered 2019-02-12: 200 mg via INTRAVENOUS

## 2019-02-12 MED ORDER — OXYCODONE HCL 5 MG PO TABS
ORAL_TABLET | ORAL | Status: AC
  Filled 2019-02-12: qty 1

## 2019-02-12 MED ORDER — MIDAZOLAM HCL 2 MG/2ML IJ SOLN
INTRAMUSCULAR | Status: AC
  Filled 2019-02-12: qty 2

## 2019-02-12 MED ORDER — DEXAMETHASONE SODIUM PHOSPHATE 4 MG/ML IJ SOLN
INTRAMUSCULAR | Status: DC | PRN
  Administered 2019-02-12: 10 mg via INTRAVENOUS

## 2019-02-12 MED ORDER — LACTATED RINGERS IV SOLN
INTRAVENOUS | Status: DC
  Administered 2019-02-12 (×2): via INTRAVENOUS

## 2019-02-12 MED ORDER — FENTANYL CITRATE (PF) 100 MCG/2ML IJ SOLN
50.0000 ug | INTRAMUSCULAR | Status: AC | PRN
  Administered 2019-02-12 (×2): 50 ug via INTRAVENOUS
  Administered 2019-02-12: 100 ug via INTRAVENOUS

## 2019-02-12 MED ORDER — HYDROMORPHONE HCL 1 MG/ML IJ SOLN
INTRAMUSCULAR | Status: AC
  Filled 2019-02-12: qty 0.5

## 2019-02-12 MED ORDER — ONDANSETRON HCL 4 MG/2ML IJ SOLN
INTRAMUSCULAR | Status: DC | PRN
  Administered 2019-02-12: 4 mg via INTRAVENOUS

## 2019-02-12 MED ORDER — HYDROMORPHONE HCL 1 MG/ML IJ SOLN
0.2500 mg | INTRAMUSCULAR | Status: DC | PRN
  Administered 2019-02-12: 0.5 mg via INTRAVENOUS

## 2019-02-12 SURGICAL SUPPLY — 60 items
BENZOIN TINCTURE PRP APPL 2/3 (GAUZE/BANDAGES/DRESSINGS) IMPLANT
BLADE SURG 15 STRL LF DISP TIS (BLADE) ×2 IMPLANT
BLADE SURG 15 STRL SS (BLADE) ×2
CANISTER SUCT 1200ML W/VALVE (MISCELLANEOUS) ×4 IMPLANT
CANISTER SUCTION 1200CC (MISCELLANEOUS) ×4 IMPLANT
CLEANER CAUTERY TIP 5X5 PAD (MISCELLANEOUS) IMPLANT
CLOSURE WOUND 1/2 X4 (GAUZE/BANDAGES/DRESSINGS)
CORD BIPOLAR FORCEPS 12FT (ELECTRODE) IMPLANT
COVER BACK TABLE REUSABLE LG (DRAPES) ×4 IMPLANT
COVER MAYO STAND REUSABLE (DRAPES) ×4 IMPLANT
COVER WAND RF STERILE (DRAPES) IMPLANT
DECANTER SPIKE VIAL GLASS SM (MISCELLANEOUS) ×4 IMPLANT
DERMABOND ADVANCED (GAUZE/BANDAGES/DRESSINGS)
DERMABOND ADVANCED .7 DNX12 (GAUZE/BANDAGES/DRESSINGS) IMPLANT
DRAPE HALF SHEET 70X43 (DRAPES) ×4 IMPLANT
DRAPE SPLIT 6X30 W/TAPE (DRAPES) IMPLANT
ELECT COATED BLADE 2.86 ST (ELECTRODE) ×4 IMPLANT
ELECT NEEDLE TIP 2.8 STRL (NEEDLE) IMPLANT
ELECT REM PT RETURN 9FT ADLT (ELECTROSURGICAL) ×4
ELECTRODE REM PT RTRN 9FT ADLT (ELECTROSURGICAL) ×2 IMPLANT
GAUZE 4X4 16PLY RFD (DISPOSABLE) IMPLANT
GAUZE SPONGE 4X4 12PLY STRL (GAUZE/BANDAGES/DRESSINGS) IMPLANT
GAUZE SPONGE 4X4 12PLY STRL LF (GAUZE/BANDAGES/DRESSINGS) ×8 IMPLANT
GLOVE BIO SURGEON STRL SZ7.5 (GLOVE) ×4 IMPLANT
GLOVE BIOGEL PI IND STRL 7.0 (GLOVE) ×4 IMPLANT
GLOVE BIOGEL PI INDICATOR 7.0 (GLOVE) ×4
GLOVE ECLIPSE 6.5 STRL STRAW (GLOVE) ×4 IMPLANT
GOWN STRL REUS W/ TWL LRG LVL3 (GOWN DISPOSABLE) ×4 IMPLANT
GOWN STRL REUS W/TWL LRG LVL3 (GOWN DISPOSABLE) ×4
LOCATOR NERVE 3 VOLT (DISPOSABLE) IMPLANT
NEEDLE HYPO 25X1 1.5 SAFETY (NEEDLE) IMPLANT
NEEDLE PRECISIONGLIDE 27X1.5 (NEEDLE) ×4 IMPLANT
NS IRRIG 1000ML POUR BTL (IV SOLUTION) ×4 IMPLANT
PACK BASIN DAY SURGERY FS (CUSTOM PROCEDURE TRAY) ×4 IMPLANT
PAD CLEANER CAUTERY TIP 5X5 (MISCELLANEOUS)
PENCIL BUTTON HOLSTER BLD 10FT (ELECTRODE) ×4 IMPLANT
SCISSORS WIRE ANG 4 3/4 DISP (INSTRUMENTS) ×4 IMPLANT
STRIP CLOSURE SKIN 1/2X4 (GAUZE/BANDAGES/DRESSINGS) IMPLANT
SUCTION FRAZIER HANDLE 10FR (MISCELLANEOUS)
SUCTION TUBE FRAZIER 10FR DISP (MISCELLANEOUS) IMPLANT
SUT CHROMIC 4 0 P 3 18 (SUTURE) IMPLANT
SUT ETHIBOND 5 0 P 3 (SUTURE)
SUT ETHILON 4 0 PS 2 18 (SUTURE) IMPLANT
SUT ETHILON 5 0 P 3 18 (SUTURE)
SUT MNCRL AB 3-0 PS2 18 (SUTURE) ×4 IMPLANT
SUT NYLON ETHILON 5-0 P-3 1X18 (SUTURE) IMPLANT
SUT PLAIN 5 0 P 3 18 (SUTURE) IMPLANT
SUT POLY ETHIBOND 5-0 P-3 1X18 (SUTURE) IMPLANT
SUT SILK 2 0 SH (SUTURE) ×4 IMPLANT
SUT VIC AB 4-0 P-3 18XBRD (SUTURE) IMPLANT
SUT VIC AB 4-0 P3 18 (SUTURE)
SWAB COLLECTION DEVICE MRSA (MISCELLANEOUS) IMPLANT
SWAB CULTURE ESWAB REG 1ML (MISCELLANEOUS) ×4 IMPLANT
SYR 20CC LL (SYRINGE) IMPLANT
SYR CONTROL 10ML LL (SYRINGE) ×4 IMPLANT
TOWEL GREEN STERILE FF (TOWEL DISPOSABLE) ×8 IMPLANT
TRAY DSU PREP LF (CUSTOM PROCEDURE TRAY) ×4 IMPLANT
TUBE CONNECTING 20'X1/4 (TUBING) ×1
TUBE CONNECTING 20X1/4 (TUBING) ×3 IMPLANT
YANKAUER SUCT BULB TIP NO VENT (SUCTIONS) ×4 IMPLANT

## 2019-02-12 NOTE — Anesthesia Procedure Notes (Signed)
Procedure Name: LMA Insertion Date/Time: 02/12/2019 11:23 AM Performed by: Maryella Shivers, CRNA Pre-anesthesia Checklist: Patient identified, Emergency Drugs available, Suction available and Patient being monitored Patient Re-evaluated:Patient Re-evaluated prior to induction Oxygen Delivery Method: Circle system utilized Preoxygenation: Pre-oxygenation with 100% oxygen Induction Type: IV induction Ventilation: Mask ventilation without difficulty LMA: LMA inserted LMA Size: 4.0 Number of attempts: 1 Airway Equipment and Method: Bite block Placement Confirmation: positive ETCO2 Tube secured with: Tape Dental Injury: Teeth and Oropharynx as per pre-operative assessment

## 2019-02-12 NOTE — Op Note (Signed)
NAME: Juan Crawford, HUPP MEDICAL RECORD HQ:[id number] ACCOUNT [id number] DATE OF BIRTH:07-27-1981 FACILITY: [facility] LOCATION: [facility] PHYSICIAN:Erina Hamme D. 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  PROCEDURE:   1.  Removal of mandibular arch bars. 2.  Incision and drainage of left neck wound.  SURGEON:  Melida Quitter, MD  ANESTHESIA:  General LMA.  COMPLICATIONS:  None.  INDICATIONS:  The patient is a 38 year old male who sustained right parasymphyseal and left angle mandible fractures several weeks ago and was treated with open reduction internal fixation of both fractures along with maxillomandibular fixation.  Wires  were cut after 4 weeks and he presents for removal of the arch bars.  Complicating his course, he had an infection of his left neck wound soon after surgery that required drainage and antibiotic therapy.  This improved for a time, but has swollen again  and started draining again recently.  Thus, the wound was to be explored and drained and cultures taken.  We are making arrangements for CT imaging as well as infectious disease consultation for consideration of long-term IV antibiotics.  DESCRIPTION OF PROCEDURE:  The patient was identified in the holding room, informed consent having been obtained including discussion of risks, benefits and alternatives, the patient was brought to the operative suite and put the operative table in  supine position.  Anesthesia was induced and the patient was intubated with an LMA without difficulty.  The patient is already on oral antibiotics.  The eyes were taped closed and the bed was turned slightly to the left.  The left neck was prepped and  draped in sterile fashion.  The wound was explored with a hemostat where  there were 2 draining locations.  The more anterior location did not seem to connect in very well, so the more posterior location was probed further with a hemostat up toward the  mandible.  Cultures were taken from the depths of the wound.  A 1/4 inch Penrose drain was then placed within the depths of the wound and secured to the skin using a 2-0 nylon suture.  In the mouth, the screwed locations were injected with 1% lidocaine  with 1:100,000 epinephrine.  Most of the screws were covered with mucosa and required incision of the mucosa with a 15 blade scalpel to expose the screws that were then backed out.  Both arch bars were removed.  The mouth was then suctioned, and he was  returned to anesthesia for wakeup and was extubated in the recovery room in stable condition.  TN/NUANCE  D:02/12/2019 T:02/12/2019 JOB:[id number]

## 2019-02-12 NOTE — Brief Op Note (Signed)
02/12/2019  11:50 AM  PATIENT:  Juan Crawford  38 y.o. male  PRE-OPERATIVE DIAGNOSIS:  jaw fracture HARDWARE REMOVAL left neck wound infection  POST-OPERATIVE DIAGNOSIS:  jaw fracture HARDWARE REMOVAL left neck wound infection  PROCEDURE:  Procedure(s): MANDIBULAR HARDWARE REMOVAL (Bilateral) DEBRIDEMENT NECK WOUND AND PLACEMENT OF DRAIN (Left)  SURGEON:  Surgeon(s) and Role:    Melida Quitter, MD - Primary  PHYSICIAN ASSISTANT:   ASSISTANTS: none   ANESTHESIA:   general  EBL: Minimal   BLOOD ADMINISTERED:none  DRAINS: Penrose drain in the left neck   LOCAL MEDICATIONS USED:  LIDOCAINE   SPECIMEN:  No Specimen  DISPOSITION OF SPECIMEN:  N/A  COUNTS:  YES  TOURNIQUET:  * No tourniquets in log *  DICTATION: .Other Dictation: Dictation Number [id number]  PLAN OF CARE: Discharge to home after PACU  PATIENT DISPOSITION:  PACU - hemodynamically stable.   Delay start of Pharmacological VTE agent (>24hrs) due to surgical blood loss or risk of bleeding: no

## 2019-02-12 NOTE — Transfer of Care (Signed)
Immediate Anesthesia Transfer of Care Note  Patient: Juan Crawford  Procedure(s) Performed: MANDIBULAR HARDWARE REMOVAL (Bilateral Mouth) DEBRIDEMENT NECK WOUND AND PLACEMENT OF DRAIN (Left Neck)  Patient Location: PACU  Anesthesia Type:General  Level of Consciousness: sedated  Airway & Oxygen Therapy: Patient Spontanous Breathing and Patient connected to nasal cannula oxygen  Post-op Assessment: Report given to RN and Post -op Vital signs reviewed and stable  Post vital signs: Reviewed and stable  Last Vitals:  Vitals Value Taken Time  BP 134/87 02/12/19 1215  Temp 36.7 C 02/12/19 1200  Pulse 91 02/12/19 1222  Resp 15 02/12/19 1222  SpO2 100 % 02/12/19 1222  Vitals shown include unvalidated device data.  Last Pain:  Vitals:   02/12/19 1215  TempSrc:   PainSc: 5          Complications: No apparent anesthesia complications

## 2019-02-12 NOTE — H&P (Signed)
Juan Crawford is an 38 y.o. male.   Chief Complaint: Mandible fracture and neck wound infection HPI: 38 year old male with history of mandible fracture managed with ORIF and MMF.  He had a left neck wound infection following surgery that cleared with antibiotics.  He has been able to have wires cut and presents for removal of arch bars.  The left neck has become more swollen over the past week or two and he was started back on clindamycin.  The left neck wound began draining a couple of days ago.  Past Medical History:  Diagnosis Date  . Hypertension   . Nicotine dependence     Past Surgical History:  Procedure Laterality Date  . FACIAL LACERATION REPAIR N/A 12/21/2018   Procedure: Facial Laceration Repair;  Surgeon: Melida Quitter, MD;  Location: [place];  Service: ENT;  Laterality: N/A;  3cm wound on chin  . HAND SURGERY  2001  . ORIF MANDIBULAR FRACTURE N/A 12/21/2018   Procedure: MAXILLO-MANDIBULAR FIXATION AND ORIF OF MANDIBLE FRACTURE;  Surgeon: Melida Quitter, MD;  Location: [place];  Service: ENT;  Laterality: N/A;    Family History  Problem Relation Age of Onset  . Diabetes Neg Hx    Social History:  reports that he has been smoking cigarettes. He has a 20.00 pack-year smoking history. He has never used smokeless tobacco. He reports previous alcohol use of about 6.0 standard drinks of alcohol per week. He reports previous drug use. Frequency: 3.00 times per week. Drug: Marijuana.  Allergies: No Known Allergies  Medications Prior to Admission  Medication Sig Dispense Refill  . amLODipine (NORVASC) 5 MG tablet Take 5 mg by mouth daily.    . chlorhexidine (PERIDEX) 0.12 % solution Use as directed 15 mLs in the mouth or throat 2 (two) times daily. 600 mL 0  . clindamycin (CLEOCIN) 300 MG capsule Take 300 mg by mouth 4 (four) times daily.    . feeding supplement, ENSURE ENLIVE, (ENSURE ENLIVE) LIQD Take 237 mLs by mouth 3 (three) times daily between meals.    Marland Kitchen ibuprofen (ADVIL) 100  MG/5ML suspension Take 20 mLs (400 mg total) by mouth every 8 (eight) hours as needed for fever or moderate pain. 420 mL 0  . metoprolol tartrate (LOPRESSOR) 25 MG tablet Take 25 mg by mouth 2 (two) times daily.    . Multiple Vitamin (MULTIVITAMIN) LIQD Take 15 mLs by mouth daily. 450 mL 0  . nicotine (NICODERM CQ) 14 mg/24hr patch Place 14 mg onto the skin daily.      No results found for this or any previous visit (from the past 48 hour(s)). No results found.  Review of Systems  All other systems reviewed and are negative.   Blood pressure (!) 159/95, pulse (!) 108, temperature 98.7 F (37.1 C), temperature source Oral, resp. rate 18, height 6\' 1"  (1.854 m), weight 80.6 kg, SpO2 99 %. Physical Exam  Constitutional: He is oriented to person, place, and time. He appears well-developed and well-nourished. No distress.  HENT:  Head: Normocephalic and atraumatic.  Right Ear: External ear normal.  Left Ear: External ear normal.  Nose: Nose normal.  Mouth/Throat: Oropharynx is clear and moist.  Eyes: Pupils are equal, round, and reactive to light. Conjunctivae and EOM are normal.  Neck: Normal range of motion. Neck supple.  Trismus.  Left neck edema with mild drainage from previous incision.  Arch bars remain in place.  Cardiovascular: Normal rate.  Respiratory: Effort normal.  Neurological: He  is alert and oriented to person, place, and time. No cranial nerve deficit.  Skin: Skin is warm and dry.  Psychiatric: He has a normal mood and affect. His behavior is normal. Judgment and thought content normal.     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  Christia Readingwight Jhade Berko, MD 02/12/2019, 11:08 AM

## 2019-02-12 NOTE — Anesthesia Postprocedure Evaluation (Signed)
Anesthesia Post Note  Patient: Juan Crawford  Procedure(s) Performed: MANDIBULAR HARDWARE REMOVAL (Bilateral Mouth) DEBRIDEMENT NECK WOUND AND PLACEMENT OF DRAIN (Left Neck)     Patient location during evaluation: PACU Anesthesia Type: General Level of consciousness: sedated and patient cooperative Pain management: pain level controlled Vital Signs Assessment: post-procedure vital signs reviewed and stable Respiratory status: spontaneous breathing Cardiovascular status: stable Anesthetic complications: no    Last Vitals:  Vitals:   02/12/19 1230 02/12/19 1233  BP: (!) 149/103 (!) 146/98  Pulse: 87 91  Resp: 13 17  Temp:    SpO2: 100% 100%    Last Pain:  Vitals:   02/12/19 1230  TempSrc:   PainSc: [place]

## 2019-02-12 NOTE — Anesthesia Preprocedure Evaluation (Signed)
Anesthesia Evaluation  Patient identified by MRN, date of birth, ID band Patient awake    Reviewed: Allergy & Precautions, NPO status , Patient's Chart, lab work & pertinent test results  History of Anesthesia Complications Negative for: history of anesthetic complications  Airway Mallampati: IV   Neck ROM: Full  Mouth opening: Limited Mouth Opening  Dental  (+) Teeth Intact, Poor Dentition, Loose, Chipped, Dental Advisory Given,    Pulmonary neg recent URI, Current Smoker,    breath sounds clear to auscultation       Cardiovascular hypertension, Pt. on medications and Pt. on home beta blockers  Rhythm:Regular Rate:Tachycardia     Neuro/Psych PSYCHIATRIC DISORDERS negative neurological ROS     GI/Hepatic negative GI ROS, (+)     substance abuse  alcohol use,   Endo/Other    Renal/GU negative Renal ROS     Musculoskeletal negative musculoskeletal ROS (+)   Abdominal   Peds  Hematology negative hematology ROS (+)   Anesthesia Other Findings   Reproductive/Obstetrics                             Anesthesia Physical  Anesthesia Plan  ASA: III  Anesthesia Plan: General   Post-op Pain Management:    Induction: Intravenous  PONV Risk Score and Plan: 1 and Ondansetron and Dexamethasone  Airway Management Planned: Video Laryngoscope Planned and Nasal ETT  Additional Equipment: None  Intra-op Plan:   Post-operative Plan: Extubation in OR  Informed Consent: I have reviewed the patients History and Physical, chart, labs and discussed the procedure including the risks, benefits and alternatives for the proposed anesthesia with the patient or authorized representative who has indicated his/her understanding and acceptance.     Dental advisory given  Plan Discussed with: CRNA  Anesthesia Plan Comments:         Anesthesia Quick Evaluation

## 2019-02-12 NOTE — Discharge Instructions (Signed)

## 2019-02-13 ENCOUNTER — Encounter (HOSPITAL_BASED_OUTPATIENT_CLINIC_OR_DEPARTMENT_OTHER): Payer: Self-pay | Admitting: Otolaryngology

## 2019-02-16 LAB — AEROBIC/ANAEROBIC CULTURE W GRAM STAIN (SURGICAL/DEEP WOUND)

## 2019-02-17 ENCOUNTER — Other Ambulatory Visit: Payer: Self-pay | Admitting: Otolaryngology

## 2019-02-17 ENCOUNTER — Telehealth: Payer: Self-pay | Admitting: Internal Medicine

## 2019-02-17 DIAGNOSIS — S02652D Fracture of angle of left mandible, subsequent encounter for fracture with routine healing: Secondary | ICD-10-CM

## 2019-02-17 DIAGNOSIS — M272 Inflammatory conditions of jaws: Secondary | ICD-10-CM

## 2019-02-17 DIAGNOSIS — S02601D Fracture of unspecified part of body of right mandible, subsequent encounter for fracture with routine healing: Secondary | ICD-10-CM

## 2019-02-17 NOTE — Telephone Encounter (Signed)
COVID-19 Pre-Screening Questions:02/17/19 ° °Do you currently have a fever (>100 °F), chills or unexplained body aches? NO ° °Are you currently experiencing new cough, shortness of breath, sore throat, runny nose? NO °•  °Have you recently travelled outside the state of [place] in the last 14 days? NO  °•  °Have you been in contact with someone that is currently pending confirmation of Covid19 testing or has been confirmed to have the Covid19 virus?  NO  ° °**If the patient answers NO to ALL questions -  advise the patient to please call the clinic before coming to the office should any symptoms develop.  ° ° °

## 2019-02-18 ENCOUNTER — Other Ambulatory Visit: Payer: Self-pay

## 2019-02-18 ENCOUNTER — Encounter: Payer: Self-pay | Admitting: Internal Medicine

## 2019-02-18 ENCOUNTER — Ambulatory Visit (INDEPENDENT_AMBULATORY_CARE_PROVIDER_SITE_OTHER): Payer: 59 | Admitting: Internal Medicine

## 2019-02-18 DIAGNOSIS — T8149XA Infection following a procedure, other surgical site, initial encounter: Secondary | ICD-10-CM | POA: Insufficient documentation

## 2019-02-18 MED ORDER — AMOXICILLIN-POT CLAVULANATE 875-125 MG PO TABS: 1.0000 | ORAL_TABLET | Freq: Two times a day (BID) | ORAL | 1 refills | Status: DC

## 2019-02-18 NOTE — Progress Notes (Addendum)
[facility]  Reason for Consult: Postoperative wound infection following surgery for bilateral mandibular fracture Referring Provider: Dr. Christia Readingwight Bates  Assessment: He has a persistent postoperative wound infection and I am concerned about the possibility mandibular osteomyelitis.  I discussed treatment options with him including longer course of oral antibiotics, staying with oral antibiotics at least until his CT scan results are available, or having a PICC placed and starting IV ertapenem now.  He prefers to stay on an oral antibiotic until the CT scan results come back next week.  I will put him on Augmentin and plan on seeing him back after the CT is completed.   AddendumNida Boatman: [name] and I had further discussion after his visit and he has now decided to go ahead with PICC placement and IV ertapenem.  He will take Augmentin until he starts ertapenem.  Plan: 1. Start Augmentin 875 mg twice daily and take this until a PICC has been placed and he starts IV ertapenem 2. Follow-up here on 03/12/2019  Patient Active Problem List   Diagnosis Date Noted  . Postoperative wound infection 02/18/2019    Priority: High  . Bilateral fracture of mandible, closed, initial encounter (HCC) 12/19/2018    Priority: High  . Tobacco abuse   . Alcohol withdrawal (HCC) 12/19/2018  . Hypertensive urgency 12/19/2018  . Closed jaw fracture (HCC)   . Tachycardia     Patient's Medications  New Prescriptions   AMOXICILLIN-CLAVULANATE (AUGMENTIN) 875-125 MG TABLET    Take 1 tablet by mouth 2 (two) times daily.  Previous Medications   AMLODIPINE (NORVASC) 5 MG TABLET    Take 5 mg by mouth daily.   CHLORHEXIDINE (PERIDEX) 0.12 % SOLUTION    Use as directed 15 mLs in the mouth or throat 2 (two) times daily.   CLINDAMYCIN (CLEOCIN) 300 MG CAPSULE    Take 300 mg by mouth 4 (four) times daily.   FEEDING SUPPLEMENT, ENSURE ENLIVE, (ENSURE ENLIVE) LIQD    Take 237 mLs by mouth 3  (three) times daily between meals.   IBUPROFEN (ADVIL) 100 MG/5ML SUSPENSION    Take 20 mLs (400 mg total) by mouth every 8 (eight) hours as needed for fever or moderate pain.   METOPROLOL TARTRATE (LOPRESSOR) 25 MG TABLET    Take 25 mg by mouth 2 (two) times daily.   MULTIPLE VITAMIN (MULTIVITAMIN) LIQD    Take 15 mLs by mouth daily.   NICOTINE (NICODERM CQ) 14 MG/24HR PATCH    Place 14 mg onto the skin daily.  Modified Medications   No medications on file  Discontinued Medications   No medications on file    HPI: Juan Crawford is a 38 y.o. male who suffered bilateral mandibular fractures after he had been drinking alcohol and fell down some steps.  Open reduction and internal fixation with maxillary mandibular fixation wiring following day.  He recalls being treated with oral clindamycin postoperatively for about 10 days.  The fixation wires remained in for the first month.  Postoperatively he developed drainage from his left neck incision.  He underwent removal of the arch bars on 02/12/2019.  Operative cultures from his wound grew viridans streptococci.  He was placed back on clindamycin and completed 10 more days yesterday.  He has trouble telling me whether or not the drainage or swelling of his left has improved over the last week.  He is scheduled for a CT scan on 02/26/2019.  Review  of Systems: Review of Systems  Constitutional: Positive for malaise/fatigue. Negative for chills, diaphoresis and fever.       20 pound weight loss over the past 2 months related to not being able to eat solid food.  Respiratory: Negative for cough, sputum production and shortness of breath.   Cardiovascular: Negative for chest pain.  Gastrointestinal: Negative for abdominal pain, diarrhea, nausea and vomiting.      Past Medical History:  Diagnosis Date  . Hypertension   . Nicotine dependence     Social History   Tobacco Use  . Smoking status: Current Every Day Smoker    Packs/day: 1.00     Years: 20.00    Pack years: 20.00    Types: Cigarettes  . Smokeless tobacco: Never Used  Substance Use Topics  . Alcohol use: Not Currently    Alcohol/week: 6.0 standard drinks    Types: 6 Shots of liquor per week  . Drug use: Not Currently    Frequency: 3.0 times per week    Types: Marijuana    Comment: last smoked 02/11/2019    Family History  Problem Relation Age of Onset  . Diabetes Neg Hx    No Known Allergies  OBJECTIVE: Vitals:   02/18/19 1351  BP: (!) 163/105  Pulse: (!) 120  Temp: 98 F (36.7 C)  Weight: 181 lb (82.1 kg)   Body mass index is 23.88 kg/m.   Physical Exam Constitutional:      Comments: He is pleasant and in no distress.  HENT:     Mouth/Throat:     Pharynx: No oropharyngeal exudate.     Comments: He has marked swelling and induration over his left jaw.  There is a surgical drain in place and his open wound beneath the angle of the jaw on the left side.  There is a dime sized area of purulent drainage on his gauze dressing.  I do not see any internal drainage around his left molars.    Microbiology: Recent Results (from the past 240 hour(s))  SARS Coronavirus 2 (Performed in [place] hospital lab)     Status: None   Collection Time: 02/09/19 12:49 PM   Specimen: Nasal Swab  Result Value Ref Range Status   SARS Coronavirus 2 NEGATIVE NEGATIVE Final    Comment: (NOTE) SARS-CoV-2 target nucleic acids are NOT DETECTED. The SARS-CoV-2 RNA is generally detectable in upper and lower respiratory specimens during the acute phase of infection. Negative results do not preclude SARS-CoV-2 infection, do not rule out co-infections with other pathogens, and should not be used as the sole basis for treatment or other patient management decisions. Negative results must be combined with clinical observations, patient history, and epidemiological information. The expected result is Negative. Fact Sheet for Patients:  SugarRoll.be Fact Sheet for Healthcare Providers: https://www.woods-mathews.com/ This test is not yet approved or cleared by the [place] FDA and  has been authorized for detection and/or diagnosis of SARS-CoV-2 by FDA under an Emergency Use Authorization (EUA). This EUA will remain  in effect (meaning this test can be used) for the duration of the COVID-19 declaration under Section 56 4(b)(1) of the Act, 21 U.S.C. section 360bbb-3(b)(1), unless the authorization is terminated or revoked sooner. Performed at [facility], [place] [address], [place], [place] [address]   Aerobic/Anaerobic Culture (surgical/deep wound)     Status: None   Collection Time: 02/12/19 11:38 AM   Specimen: Other; Wound  Result Value Ref Range Status   Specimen  Description WOUND LEFT NECK  Final   Special Requests NONE  Final   Gram Stain   Final    RARE WBC PRESENT, PREDOMINANTLY MONONUCLEAR NO ORGANISMS SEEN    Culture   Final    FEW VIRIDANS STREPTOCOCCUS NO ANAEROBES ISOLATED Performed at [facility], [address] [place], [place] [address]    Report Status 02/16/2019 FINAL  Final    Cliffton AstersJohn Josetta Wigal, MD [facility] [phone] pager   [phone] cell 02/18/2019, 2:31 PM

## 2019-02-18 NOTE — Assessment & Plan Note (Signed)
He has a persistent postoperative wound infection and I am concerned about the possibility fibular osteomyelitis.  I discussed treatment options with him including longer course of oral antibiotics, staying with oral antibiotics at least until his CT scan results are available, or having a PIC placed and starting IV ertapenem now.  He prefers to stay on an oral antibiotic until the CT scan results come back next week.  I will put him on Augmentin plan on seeing him back after the CT is completed.

## 2019-02-19 ENCOUNTER — Telehealth: Payer: Self-pay

## 2019-02-19 LAB — CBC
HCT: 47.5 % (ref 38.5–50.0)
Hemoglobin: 16.4 g/dL (ref 13.2–17.1)
MCH: 32.5 pg (ref 27.0–33.0)
MCHC: 34.5 g/dL (ref 32.0–36.0)
MCV: 94.1 fL (ref 80.0–100.0)
MPV: 10.7 fL (ref 7.5–12.5)
Platelets: 247 10*3/uL (ref 140–400)
RBC: 5.05 10*6/uL (ref 4.20–5.80)
RDW: 13 % (ref 11.0–15.0)
WBC: 9.6 10*3/uL (ref 3.8–10.8)

## 2019-02-19 LAB — SEDIMENTATION RATE: Sed Rate: 25 mm/h — ABNORMAL HIGH (ref 0–15)

## 2019-02-19 LAB — BASIC METABOLIC PANEL
BUN: 19 mg/dL (ref 7–25)
CO2: 25 mmol/L (ref 20–32)
Calcium: 10.1 mg/dL (ref 8.6–10.3)
Chloride: 100 mmol/L (ref 98–110)
Creat: 0.81 mg/dL (ref 0.60–1.35)
Glucose, Bld: 88 mg/dL (ref 65–99)
Potassium: 4.3 mmol/L (ref 3.5–5.3)
Sodium: 138 mmol/L (ref 135–146)

## 2019-02-19 LAB — C-REACTIVE PROTEIN: CRP: 6.4 mg/L (ref <8.0)

## 2019-02-19 MED ORDER — SODIUM CHLORIDE 0.9 % IV SOLN: 1.0000 g | INTRAVENOUS | Status: DC

## 2019-02-19 NOTE — Telephone Encounter (Signed)
Received call from Anderson Malta with [facility] regarding appointment for picc placement. Patient was able to be scheduled for 7/28 at 1 pm. Anderson Malta has already spoke with patient regarding appointment. Patient understands he will need to arrive 15 mins early to check in. IR knows that patient needs first dose at short stay. Orders have been faxed to [facility] and [facility]. Both were notified about appointment date/time.  [place]

## 2019-02-19 NOTE — Addendum Note (Signed)
Addended by: Michel Bickers on: 02/19/2019 08:46 AM   Modules accepted: Orders

## 2019-02-23 ENCOUNTER — Other Ambulatory Visit: Payer: Self-pay

## 2019-02-23 ENCOUNTER — Ambulatory Visit
Admission: RE | Admit: 2019-02-23 | Discharge: 2019-02-23 | Disposition: A | Discharge status: Home / Self Care | Payer: 59 | Source: Ambulatory Visit | Attending: Otolaryngology | Admitting: Otolaryngology

## 2019-02-23 DIAGNOSIS — S02601D Fracture of unspecified part of body of right mandible, subsequent encounter for fracture with routine healing: Secondary | ICD-10-CM

## 2019-02-23 DIAGNOSIS — M272 Inflammatory conditions of jaws: Secondary | ICD-10-CM

## 2019-02-23 DIAGNOSIS — S02652D Fracture of angle of left mandible, subsequent encounter for fracture with routine healing: Secondary | ICD-10-CM

## 2019-02-23 MED ORDER — IOPAMIDOL (ISOVUE-300) INJECTION 61%
75.0000 mL | Freq: Once | INTRAVENOUS | Status: AC | PRN
  Administered 2019-02-23: 75 mL via INTRAVENOUS

## 2019-02-24 ENCOUNTER — Encounter (HOSPITAL_COMMUNITY)
Admission: RE | Admit: 2019-02-24 | Discharge: 2019-02-24 | Disposition: A | Discharge status: Home / Self Care | Payer: 59 | Source: Ambulatory Visit | Attending: Internal Medicine | Admitting: Internal Medicine

## 2019-02-24 ENCOUNTER — Other Ambulatory Visit (HOSPITAL_COMMUNITY): Payer: Self-pay | Admitting: *Deleted

## 2019-02-24 ENCOUNTER — Other Ambulatory Visit: Payer: Self-pay

## 2019-02-24 ENCOUNTER — Other Ambulatory Visit: Payer: Self-pay | Admitting: Internal Medicine

## 2019-02-24 ENCOUNTER — Ambulatory Visit (HOSPITAL_COMMUNITY)
Admission: RE | Admit: 2019-02-24 | Discharge: 2019-02-24 | Disposition: A | Discharge status: Home / Self Care | Payer: 59 | Source: Ambulatory Visit | Attending: Internal Medicine | Admitting: Internal Medicine

## 2019-02-24 DIAGNOSIS — Y839 Surgical procedure, unspecified as the cause of abnormal reaction of the patient, or of later complication, without mention of misadventure at the time of the procedure: Secondary | ICD-10-CM | POA: Insufficient documentation

## 2019-02-24 DIAGNOSIS — I878 Other specified disorders of veins: Secondary | ICD-10-CM | POA: Insufficient documentation

## 2019-02-24 DIAGNOSIS — T8149XA Infection following a procedure, other surgical site, initial encounter: Secondary | ICD-10-CM | POA: Diagnosis present

## 2019-02-24 MED ORDER — LIDOCAINE HCL (PF) 1 % IJ SOLN
INTRAMUSCULAR | Status: DC | PRN
  Administered 2019-02-24: 10 mL

## 2019-02-24 MED ORDER — LIDOCAINE HCL 1 % IJ SOLN
INTRAMUSCULAR | Status: AC
  Filled 2019-02-24: qty 20

## 2019-02-24 MED ORDER — SODIUM CHLORIDE 0.9 % IV SOLN
1000.0000 mg | Freq: Once | INTRAVENOUS | Status: AC
  Administered 2019-02-24: 14:00:00 1000 mg via INTRAVENOUS
  Filled 2019-02-24: qty 1

## 2019-02-24 MED ORDER — HEPARIN SOD (PORK) LOCK FLUSH 100 UNIT/ML IV SOLN
INTRAVENOUS | Status: AC
  Administered 2019-02-24: 250 [IU]
  Filled 2019-02-24: qty 5

## 2019-02-24 MED ORDER — HEPARIN SOD (PORK) LOCK FLUSH 100 UNIT/ML IV SOLN: 250.0000 [IU] | INTRAVENOUS | Status: DC | PRN

## 2019-02-24 NOTE — Procedures (Signed)
Successful placement of single lumen PICC line to right basilic vein. Length 45 cm Tip at lower SVC/RA PICC capped No complications Ready for use.  EBL < 5 mL   Ascencion Dike PA-C 02/24/2019 1:38 PM

## 2019-02-25 ENCOUNTER — Other Ambulatory Visit: Payer: Self-pay | Admitting: Internal Medicine

## 2019-02-25 DIAGNOSIS — T8149XA Infection following a procedure, other surgical site, initial encounter: Secondary | ICD-10-CM

## 2019-02-25 MED ORDER — SODIUM CHLORIDE 0.9 % IV SOLN: 1.0000 g | INTRAVENOUS | Status: AC

## 2019-02-26 ENCOUNTER — Other Ambulatory Visit: Payer: 59

## 2019-03-06 ENCOUNTER — Ambulatory Visit: Payer: 59 | Admitting: Nurse Practitioner

## 2019-03-10 ENCOUNTER — Encounter: Payer: Self-pay | Admitting: Internal Medicine

## 2019-03-12 ENCOUNTER — Ambulatory Visit (INDEPENDENT_AMBULATORY_CARE_PROVIDER_SITE_OTHER): Payer: 59 | Admitting: Internal Medicine

## 2019-03-12 ENCOUNTER — Other Ambulatory Visit: Payer: Self-pay

## 2019-03-12 ENCOUNTER — Telehealth: Payer: Self-pay

## 2019-03-12 ENCOUNTER — Encounter: Payer: Self-pay | Admitting: Internal Medicine

## 2019-03-12 DIAGNOSIS — T8149XA Infection following a procedure, other surgical site, initial encounter: Secondary | ICD-10-CM

## 2019-03-12 NOTE — Telephone Encounter (Signed)
Per Dr. Megan Salon called, [place] spoke to [facility] to extend IV antibiotics to 04/08/2019.  Melissa relayed understanding with feedback.  Bryann is aware of new plans of care.   Lenore Cordia, [place]

## 2019-03-13 NOTE — Progress Notes (Signed)
[facility]  Patient Active Problem List   Diagnosis Date Noted  . Postoperative wound infection 02/18/2019    Priority: High  . Bilateral fracture of mandible, closed, initial encounter ([place]) 12/19/2018    Priority: High  . Tobacco abuse   . Alcohol withdrawal ([place]) 12/19/2018  . Hypertensive urgency 12/19/2018  . Closed jaw fracture ([place])   . Tachycardia     Patient's Medications  New Prescriptions   No medications on file  Previous Medications   AMLODIPINE (NORVASC) 5 MG TABLET    Take 5 mg by mouth daily.   CHLORHEXIDINE (PERIDEX) 0.12 % SOLUTION    Use as directed 15 mLs in the mouth or throat 2 (two) times daily.   ERTAPENEM 1,000 MG IN SODIUM CHLORIDE 0.9 % 100 ML    Inject 1,000 mg into the vein daily.   FEEDING SUPPLEMENT, ENSURE ENLIVE, (ENSURE ENLIVE) LIQD    Take 237 mLs by mouth 3 (three) times daily between meals.   IBUPROFEN (ADVIL) 100 MG/5ML SUSPENSION    Take 20 mLs (400 mg total) by mouth every 8 (eight) hours as needed for fever or moderate pain.   METOPROLOL TARTRATE (LOPRESSOR) 25 MG TABLET    Take 25 mg by mouth 2 (two) times daily.   MULTIPLE VITAMIN (MULTIVITAMIN) LIQD    Take 15 mLs by mouth daily.   NICOTINE (NICODERM CQ) 14 MG/24HR PATCH    Place 14 mg onto the skin daily.  Modified Medications   No medications on file  Discontinued Medications   No medications on file    Subjective: Juan Crawford is in for his routine follow-up visit.  He suffered bilateral mandibular fractures after he had been drinking alcohol and fell down some steps 12/20/2018.    He underwent open reduction and internal fixation with maxillary mandibular fixation wiring.  He recalls being treated with oral clindamycin postoperatively for about 10 days.  The fixation wires remained in for the first month.  Postoperatively he developed drainage from his left neck incision.  He underwent removal of the arch bars on 02/12/2019.  Operative cultures from his  wound grew viridans streptococci.  He was placed back on clindamycin and completed 10 more days.   I saw him on 02/18/2019 and we elected to have a PICC placed and he started on IV ertapenem on 02/24/2019.  He underwent a CT scan which showed no strong evidence of osteomyelitis or loosening of hardware but only minimal healing of his bilateral mandibular fractures.  He was referred to Dr. Diona Browner, oral surgeon, who has referred him on to [facility] after determining that he is likely to need further surgery.  That visit is scheduled for the end of this month.  His wound drainage has stopped.  He has not had any problems tolerating his PICC or ertapenem.  Review of Systems: Review of Systems  Constitutional: Negative for fever.  Gastrointestinal: Negative for abdominal pain, diarrhea, nausea and vomiting.    Past Medical History:  Diagnosis Date  . Hypertension   . Nicotine dependence     Social History   Tobacco Use  . Smoking status: Current Every Day Smoker    Packs/day: 1.00    Years: 20.00    Pack years: 20.00    Types: Cigarettes  . Smokeless tobacco: Never Used  Substance Use Topics  . Alcohol use: Not Currently    Alcohol/week: 6.0 standard drinks    Types: 6 Shots  of liquor per week  . Drug use: Not Currently    Frequency: 3.0 times per week    Types: Marijuana    Comment: last smoked 02/11/2019    Family History  Problem Relation Age of Onset  . Diabetes Neg Hx     No Known Allergies  Objective: Vitals:   03/12/19 1558  BP: (!) 171/97  Pulse: (!) 139  Temp: 98.7 F (37.1 C)   There is no height or weight on file to calculate BMI.  Physical Exam Constitutional:      Comments: He is in good spirits.  HENT:     Mouth/Throat:     Comments: His wounds have healed without further drainage.  Left jaw swelling has decreased.    Lab Results Sed Rate (mm/h)  Date Value  02/18/2019 25 (H)   CRP (mg/L)  Date Value  02/18/2019 6.4    Problem List Items  Addressed This Visit      High   Postoperative wound infection    Although his CT scan did not show clear evidence of osteomyelitis I am certainly concerned about infection causing delayed healing of his fracture sites.  I discussed management options with him and we agree to continue IV ertapenem for at least 6 weeks through 04/08/2019.  He will follow-up at that time.          Cliffton AstersJohn Taegen Lennox, MD [facility] [phone] pager   [phone] cell 03/13/2019, 10:25 AM

## 2019-03-13 NOTE — Assessment & Plan Note (Signed)
Although his CT scan did not show clear evidence of osteomyelitis I am certainly concerned about infection causing delayed healing of his fracture sites.  I discussed management options with him and we agree to continue IV ertapenem for at least 6 weeks through 04/08/2019.  He will follow-up at that time.

## 2019-03-25 ENCOUNTER — Telehealth: Payer: Self-pay

## 2019-03-25 NOTE — Telephone Encounter (Addendum)
Received call from Dr. Corene Cornea with [facility] oral and maxillofacial surgery asks if Dr. Megan Salon would like to extend IV antibiotics through surgery which will take place on 04/28/19. Routing to MD for advise. Dr. Gennette Pac can be reached at [phone]. Faxing last office notes to Dr. Arlyce Harman office at [place]

## 2019-03-30 NOTE — Telephone Encounter (Signed)
I will discuss this with Juan Crawford comes for his next visit on 04/08/2019.

## 2019-04-08 ENCOUNTER — Encounter: Payer: Self-pay | Admitting: Internal Medicine

## 2019-04-08 ENCOUNTER — Other Ambulatory Visit: Payer: Self-pay

## 2019-04-08 ENCOUNTER — Telehealth: Payer: Self-pay

## 2019-04-08 ENCOUNTER — Ambulatory Visit (INDEPENDENT_AMBULATORY_CARE_PROVIDER_SITE_OTHER): Payer: 59 | Admitting: Internal Medicine

## 2019-04-08 DIAGNOSIS — T8149XA Infection following a procedure, other surgical site, initial encounter: Secondary | ICD-10-CM | POA: Diagnosis not present

## 2019-04-08 NOTE — Progress Notes (Signed)
[facility]  Patient Active Problem List   Diagnosis Date Noted  . Postoperative wound infection 02/18/2019    Priority: High  . Bilateral fracture of mandible, closed, initial encounter ([place]) 12/19/2018    Priority: High  . Tobacco abuse   . Alcohol withdrawal ([place]) 12/19/2018  . Hypertensive urgency 12/19/2018  . Closed jaw fracture ([place])   . Tachycardia     Patient's Medications  New Prescriptions   No medications on file  Previous Medications   AMLODIPINE (NORVASC) 5 MG TABLET    Take 10 mg by mouth daily.    CHLORHEXIDINE (PERIDEX) 0.12 % SOLUTION    Use as directed 15 mLs in the mouth or throat 2 (two) times daily.   FEEDING SUPPLEMENT, ENSURE ENLIVE, (ENSURE ENLIVE) LIQD    Take 237 mLs by mouth 3 (three) times daily between meals.   IBUPROFEN (ADVIL) 100 MG/5ML SUSPENSION    Take 20 mLs (400 mg total) by mouth every 8 (eight) hours as needed for fever or moderate pain.   METOPROLOL TARTRATE (LOPRESSOR) 25 MG TABLET    Take 50 mg by mouth 2 (two) times daily.    MULTIPLE VITAMIN (MULTIVITAMIN) LIQD    Take 15 mLs by mouth daily.   NICOTINE (NICODERM CQ) 14 MG/24HR PATCH    Place 14 mg onto the skin daily.  Modified Medications   No medications on file  Discontinued Medications   No medications on file    Subjective: Juan Crawford is in for his routine follow-up visit.  He suffered bilateral mandibular fractures after he had been drinking alcohol and fell down some steps 12/20/2018.    He underwent open reduction and internal fixation with maxillary mandibular fixation wiring.  He recalls being treated with oral clindamycin postoperatively for about 10 days.  The fixation wires remained in for the first month.  Postoperatively he developed drainage from his left neck incision.  He underwent removal of the arch bars on 02/12/2019.  Operative cultures from his wound grew viridans streptococci.  He was placed back on clindamycin and completed 10 more  days.   I saw him on 02/18/2019 and we elected to have a PICC placed and he started on IV ertapenem on 02/24/2019.  He underwent a CT scan which showed no strong evidence of osteomyelitis or loosening of hardware but only minimal healing of his bilateral mandibular fractures.  He was referred to Dr. Diona Browner, oral surgeon, who has referred him on to [facility] after determining that he is likely to need further surgery.  He saw Dr. Corene Cornea on 03/25/2019.  He is now scheduled for surgery at [facility] on 04/28/2019.  The plan is to remove bilateral infected hardware, osteotomy of the mandible and internal reduction.  His wound drainage has stopped.  He has not had any problems tolerating his PICC or ertapenem.  Review of Systems: Review of Systems  Constitutional: Negative for fever.  Gastrointestinal: Negative for abdominal pain, diarrhea, nausea and vomiting.    Past Medical History:  Diagnosis Date  . Hypertension   . Nicotine dependence     Social History   Tobacco Use  . Smoking status: Current Every Day Smoker    Packs/day: 1.00    Years: 20.00    Pack years: 20.00    Types: Cigarettes  . Smokeless tobacco: Never Used  Substance Use Topics  . Alcohol use: Not Currently    Alcohol/week: 6.0 standard drinks  Types: 6 Shots of liquor per week  . Drug use: Not Currently    Frequency: 3.0 times per week    Types: Marijuana    Comment: last smoked 02/11/2019    Family History  Problem Relation Age of Onset  . Diabetes Neg Hx     No Known Allergies  Objective: Vitals:   04/08/19 1114  BP: (!) 160/99  Pulse: (!) 130  Temp: 97.6 F (36.4 C)  TempSrc: Oral   There is no height or weight on file to calculate BMI.  Physical Exam Constitutional:      Comments: He is in good spirits.  HENT:     Mouth/Throat:     Comments: His wounds have healed without further drainage.  He has a firm nodule under his left mandible.  His left jaw swelling is unchanged.    Lab Results  Sed Rate (mm/h)  Date Value  02/18/2019 25 (H)   CRP (mg/L)  Date Value  02/18/2019 6.4    Problem List Items Addressed This Visit      High   Postoperative wound infection    I reviewed treatment options with Juan BoatmanBrad today including stopping ertapenem now and removing his PICC line versus continuing ertapenem at least through his upcoming surgery.  We both agree on continuing his ertapenem.  If there is evidence of active infection at the time of surgery I would recommend submitting tissue for Gram stain, aerobic and anaerobic cultures to help guide postoperative antibiotic therapy.  I will schedule a follow-up visit here in 6 weeks.          Cliffton AstersJohn Jackey Housey, MD [facility] [phone] pager   [phone] cell 04/08/2019, 11:44 AM

## 2019-04-08 NOTE — Telephone Encounter (Signed)
Per Dr. Megan Salon called advance with verbal order to extend patient's IV antibiotics through 9/29. Spoke with Stanton Kidney who was able to take my call. Order was repeated and confirmed before ending call. Patient is aware of IV antibiotic extension, and will call office if he has any questions/ concerns. [place]

## 2019-04-08 NOTE — Assessment & Plan Note (Signed)
I reviewed treatment options with Juan Crawford today including stopping ertapenem now and removing his PICC line versus continuing ertapenem at least through his upcoming surgery.  We both agree on continuing his ertapenem.  If there is evidence of active infection at the time of surgery I would recommend submitting tissue for Gram stain, aerobic and anaerobic cultures to help guide postoperative antibiotic therapy.  I will schedule a follow-up visit here in 6 weeks.

## 2019-04-30 ENCOUNTER — Telehealth: Payer: Self-pay

## 2019-04-30 ENCOUNTER — Telehealth: Payer: Self-pay | Admitting: Internal Medicine

## 2019-04-30 MED ORDER — OXYCODONE HCL 5 MG PO TABS
5.00 | ORAL_TABLET | ORAL | Status: DC
Start: ? — End: 2019-04-30

## 2019-04-30 MED ORDER — BACITRACIN 500 UNIT/GM EX OINT
TOPICAL_OINTMENT | CUTANEOUS | Status: DC
Start: 2019-04-29 — End: 2019-04-30

## 2019-04-30 MED ORDER — IBUPROFEN 600 MG PO TABS
600.00 | ORAL_TABLET | ORAL | Status: DC
Start: 2019-04-29 — End: 2019-04-30

## 2019-04-30 MED ORDER — ACETAMINOPHEN 500 MG PO TABS
1000.00 | ORAL_TABLET | ORAL | Status: DC
Start: 2019-04-29 — End: 2019-04-30

## 2019-04-30 MED ORDER — ONDANSETRON HCL 4 MG/2ML IJ SOLN
4.00 | INTRAMUSCULAR | Status: DC
Start: ? — End: 2019-04-30

## 2019-04-30 MED ORDER — HEPARIN SODIUM LOCK FLUSH 100 UNIT/ML IV SOLN
3.00 | INTRAVENOUS | Status: DC
Start: ? — End: 2019-04-30

## 2019-04-30 NOTE — Telephone Encounter (Signed)
I received a call from Dr. Arcola Jansky [phone], oral and maxillofacial surgery at [facility] today.  He informed me that Juan Crawford had surgery on 04/28/2019 to remove his mandibular hardware and do some reconstruction.  No gross purulence was noted but there was some mottling of bone.  Although I cannot see it in care everywhere yet he tells me that bone was submitted for pathology and culture.  I will see [name] back next week to help determine what to do with his PICC line and antibiotics.

## 2019-04-30 NOTE — Telephone Encounter (Signed)
Patient called the office and made aware of new appointment. Juan Crawford

## 2019-04-30 NOTE — Telephone Encounter (Signed)
Dr Arcola Jansky with [facility] is calling to discuss the patient with Dr Megan Salon.     [phone]  Direct personal cell.    Laverle Patter, RN

## 2019-04-30 NOTE — Telephone Encounter (Signed)
Please call Juan Crawford and let him know that I scheduled him for a follow-up visit next Tuesday, 05/05/2019 to review his operative cultures.

## 2019-05-04 ENCOUNTER — Telehealth: Payer: Self-pay

## 2019-05-04 NOTE — Telephone Encounter (Signed)
COVID-19 Pre-Screening Questions:05/04/19  Do you currently have a fever (>100 F), chills or unexplained body aches? NO  Are you currently experiencing new cough, shortness of breath, sore throat, runny nose? NO  Have you recently travelled outside the state of [place] in the last 14 days? NO  .  Have you been in contact with someone that is currently pending confirmation of Covid19 testing or has been confirmed to have the [place] virus?  NO  **If the patient answers NO to ALL questions -  advise the patient to please call the clinic before coming to the office should any symptoms develop.

## 2019-05-05 ENCOUNTER — Other Ambulatory Visit: Payer: Self-pay

## 2019-05-05 ENCOUNTER — Encounter: Payer: Self-pay | Admitting: Internal Medicine

## 2019-05-05 ENCOUNTER — Ambulatory Visit (INDEPENDENT_AMBULATORY_CARE_PROVIDER_SITE_OTHER): Payer: 59 | Admitting: Internal Medicine

## 2019-05-05 DIAGNOSIS — T8149XA Infection following a procedure, other surgical site, initial encounter: Secondary | ICD-10-CM | POA: Diagnosis not present

## 2019-05-05 MED ORDER — SODIUM CHLORIDE 0.9 % IV SOLN: 1.0000 g | INTRAVENOUS | Status: DC

## 2019-05-05 NOTE — Progress Notes (Signed)
[facility]  Patient Active Problem List   Diagnosis Date Noted  . Postoperative wound infection 02/18/2019    Priority: High  . Bilateral fracture of mandible, closed, initial encounter ([place]) 12/19/2018    Priority: High  . Tobacco abuse   . Alcohol withdrawal ([place]) 12/19/2018  . Hypertensive urgency 12/19/2018  . Closed jaw fracture ([place])   . Tachycardia     Patient's Medications  New Prescriptions   ERTAPENEM 1,000 MG IN SODIUM CHLORIDE 0.9 % 100 ML    Inject 1,000 mg into the vein daily.  Previous Medications   AMLODIPINE (NORVASC) 5 MG TABLET    Take 10 mg by mouth daily.    CHLORHEXIDINE (PERIDEX) 0.12 % SOLUTION    Use as directed 15 mLs in the mouth or throat 2 (two) times daily.   FEEDING SUPPLEMENT, ENSURE ENLIVE, (ENSURE ENLIVE) LIQD    Take 237 mLs by mouth 3 (three) times daily between meals.   IBUPROFEN (ADVIL) 100 MG/5ML SUSPENSION    Take 20 mLs (400 mg total) by mouth every 8 (eight) hours as needed for fever or moderate pain.   METOPROLOL TARTRATE (LOPRESSOR) 25 MG TABLET    Take 50 mg by mouth 2 (two) times daily.    MULTIPLE VITAMIN (MULTIVITAMIN) LIQD    Take 15 mLs by mouth daily.   NICOTINE (NICODERM CQ) 14 MG/24HR PATCH    Place 14 mg onto the skin daily.  Modified Medications   No medications on file  Discontinued Medications   AMOXICILLIN-CLAVULANATE (AUGMENTIN) 875-125 MG TABLET    Take 1 tablet by mouth 2 (two) times daily.    Subjective: Juan Crawford is in for his routine follow-up visit.  He suffered bilateral mandibular fractures after he had been drinking alcohol and fell down some steps 12/20/2018.    He underwent open reduction and internal fixation with maxillary mandibular fixation wiring.  He recalls being treated with oral clindamycin postoperatively for about 10 days.  The fixation wires remained in for the first month.  Postoperatively he developed drainage from his left neck incision.  He underwent removal of  the arch bars on 02/12/2019.  Operative cultures from his wound grew viridans streptococci.  He was placed back on clindamycin and completed 10 more days.   I saw him on 02/18/2019 and we elected to have a PICC placed and he started on IV ertapenem on 02/24/2019.  He underwent a CT scan which showed no strong evidence of osteomyelitis or loosening of hardware but only minimal healing of his bilateral mandibular fractures.  He was referred to Dr. Diona Browner, oral surgeon, who has referred him on to [facility] after determining that he is likely to need further surgery.    He underwent surgery at [facility] on 04/28/2019.  Here are the reported operative findings.  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  Review of Systems: Review of Systems  Constitutional: Negative for fever.  Gastrointestinal: Negative for abdominal pain, diarrhea, nausea and vomiting.    Past Medical History:  Diagnosis Date  . Hypertension   . Nicotine dependence     Social History   Tobacco Use  . Smoking status: Current Every Day Smoker    Packs/day: 1.00    Years: 20.00    Pack years: 20.00    Types: Cigarettes  . Smokeless tobacco: Never Used  Substance Use Topics  . Alcohol use: Not Currently    Alcohol/week: 6.0 standard drinks    Types: 6 Shots of  liquor per week  . Drug use: Not Currently    Frequency: 3.0 times per week    Types: Marijuana    Comment: last smoked 02/11/2019    Family History  Problem Relation Age of Onset  . Diabetes Neg Hx     No Known Allergies  Objective: Vitals:   05/05/19 1109  BP: (!) 132/94  Pulse: (!) 106  Temp: 98 F (36.7 C)   There is no height or weight on file to calculate BMI.  Physical Exam Constitutional:      Comments: He is in good spirits.  HENT:     Mouth/Throat:     Comments: He has sutures in his left neck incision.    Lab Results Sed Rate (mm/h)  Date Value  02/18/2019 25 (H)   CRP (mg/L)  Date Value  02/18/2019 6.4    Problem List Items Addressed This Visit      High   Postoperative wound infection    I plan on continuing IV ertapenem for another 6 weeks through 06/16/2019.      Relevant Medications   ertapenem 1,000 mg in sodium chloride 0.9 % 100 mL       Cliffton Asters, MD [facility] for Infectious Disease [facility] [phone] pager   [phone] cell 05/05/2019, 11:26 AM

## 2019-05-05 NOTE — Progress Notes (Signed)
Called [facility] and gave orders per Dr. Megan Salon to Jackelyn Poling to resume IV IV ertapenem for another 6 weeks through 06/16/2019. Draw CBC w/diff and CMET weekly. Verbal orders read back and understood.  Patient made aware during office visit today. Eugenia Mcalpine

## 2019-05-05 NOTE — Assessment & Plan Note (Addendum)
I plan on continuing IV ertapenem for another 6 weeks through 06/16/2019.

## 2019-05-06 ENCOUNTER — Other Ambulatory Visit (HOSPITAL_COMMUNITY)
Admission: RE | Admit: 2019-05-06 | Discharge: 2019-05-06 | Disposition: A | Discharge status: Home / Self Care | Payer: 59 | Source: Other Acute Inpatient Hospital | Attending: Internal Medicine | Admitting: Internal Medicine

## 2019-05-06 DIAGNOSIS — S0269XA Fracture of mandible of other specified site, initial encounter for closed fracture: Secondary | ICD-10-CM | POA: Diagnosis not present

## 2019-05-06 DIAGNOSIS — T8460XA Infection and inflammatory reaction due to internal fixation device of unspecified site, initial encounter: Secondary | ICD-10-CM | POA: Insufficient documentation

## 2019-05-06 LAB — COMPREHENSIVE METABOLIC PANEL
ALT: 39 U/L (ref 0–44)
AST: 25 U/L (ref 15–41)
Albumin: 4.1 g/dL (ref 3.5–5.0)
Alkaline Phosphatase: 85 U/L (ref 38–126)
Anion gap: 13 (ref 5–15)
BUN: 16 mg/dL (ref 6–20)
CO2: 23 mmol/L (ref 22–32)
Calcium: 9.3 mg/dL (ref 8.9–10.3)
Chloride: 100 mmol/L (ref 98–111)
Creatinine, Ser: 0.89 mg/dL (ref 0.61–1.24)
GFR calc Af Amer: >60 mL/min (ref >60)
GFR calc non Af Amer: >60 mL/min (ref >60)
Glucose, Bld: 78 mg/dL (ref 70–99)
Potassium: 3.7 mmol/L (ref 3.5–5.1)
Sodium: 136 mmol/L (ref 135–145)
Total Bilirubin: 0.4 mg/dL (ref 0.3–1.2)
Total Protein: 7.5 g/dL (ref 6.5–8.1)

## 2019-05-06 LAB — CBC WITH DIFFERENTIAL/PLATELET
Abs Immature Granulocytes: 0.04 10*3/uL (ref 0.00–0.07)
Basophils Absolute: 0 10*3/uL (ref 0.0–0.1)
Basophils Relative: 0 %
Eosinophils Absolute: 0.1 10*3/uL (ref 0.0–0.5)
Eosinophils Relative: 1 %
HCT: 44.6 % (ref 39.0–52.0)
Hemoglobin: 15 g/dL (ref 13.0–17.0)
Immature Granulocytes: 0 %
Lymphocytes Relative: 21 %
Lymphs Abs: 2.2 10*3/uL (ref 0.7–4.0)
MCH: 31.6 pg (ref 26.0–34.0)
MCHC: 33.6 g/dL (ref 30.0–36.0)
MCV: 94.1 fL (ref 80.0–100.0)
Monocytes Absolute: 1.2 10*3/uL — ABNORMAL HIGH (ref 0.1–1.0)
Monocytes Relative: 12 %
Neutro Abs: 6.6 10*3/uL (ref 1.7–7.7)
Neutrophils Relative %: 66 %
Platelets: 285 10*3/uL (ref 150–400)
RBC: 4.74 MIL/uL (ref 4.22–5.81)
RDW: 12.4 % (ref 11.5–15.5)
WBC: 10.2 10*3/uL (ref 4.0–10.5)
nRBC: 0 % (ref 0.0–0.2)

## 2019-05-12 ENCOUNTER — Other Ambulatory Visit (HOSPITAL_COMMUNITY)
Admission: RE | Admit: 2019-05-12 | Discharge: 2019-05-12 | Disposition: A | Discharge status: Home / Self Care | Payer: 59 | Source: Other Acute Inpatient Hospital | Attending: Internal Medicine | Admitting: Internal Medicine

## 2019-05-12 DIAGNOSIS — T8460XS Infection and inflammatory reaction due to internal fixation device of unspecified site, sequela: Secondary | ICD-10-CM | POA: Diagnosis present

## 2019-05-12 LAB — CBC WITH DIFFERENTIAL/PLATELET
Abs Immature Granulocytes: 0.03 10*3/uL (ref 0.00–0.07)
Basophils Absolute: 0.1 10*3/uL (ref 0.0–0.1)
Basophils Relative: 1 %
Eosinophils Absolute: 0.1 10*3/uL (ref 0.0–0.5)
Eosinophils Relative: 1 %
HCT: 46.2 % (ref 39.0–52.0)
Hemoglobin: 16.1 g/dL (ref 13.0–17.0)
Immature Granulocytes: 0 %
Lymphocytes Relative: 21 %
Lymphs Abs: 2 10*3/uL (ref 0.7–4.0)
MCH: 32 pg (ref 26.0–34.0)
MCHC: 34.8 g/dL (ref 30.0–36.0)
MCV: 91.8 fL (ref 80.0–100.0)
Monocytes Absolute: 1 10*3/uL (ref 0.1–1.0)
Monocytes Relative: 11 %
Neutro Abs: 6.3 10*3/uL (ref 1.7–7.7)
Neutrophils Relative %: 66 %
Platelets: 246 10*3/uL (ref 150–400)
RBC: 5.03 MIL/uL (ref 4.22–5.81)
RDW: 12.3 % (ref 11.5–15.5)
WBC: 9.4 10*3/uL (ref 4.0–10.5)
nRBC: 0 % (ref 0.0–0.2)

## 2019-05-12 LAB — COMPREHENSIVE METABOLIC PANEL
ALT: 50 U/L — ABNORMAL HIGH (ref 0–44)
AST: 42 U/L — ABNORMAL HIGH (ref 15–41)
Albumin: 4.5 g/dL (ref 3.5–5.0)
Alkaline Phosphatase: 110 U/L (ref 38–126)
Anion gap: 14 (ref 5–15)
BUN: 14 mg/dL (ref 6–20)
CO2: 23 mmol/L (ref 22–32)
Calcium: 9.8 mg/dL (ref 8.9–10.3)
Chloride: 98 mmol/L (ref 98–111)
Creatinine, Ser: 0.8 mg/dL (ref 0.61–1.24)
GFR calc Af Amer: >60 mL/min (ref >60)
GFR calc non Af Amer: >60 mL/min (ref >60)
Glucose, Bld: 78 mg/dL (ref 70–99)
Potassium: 3.8 mmol/L (ref 3.5–5.1)
Sodium: 135 mmol/L (ref 135–145)
Total Bilirubin: 0.6 mg/dL (ref 0.3–1.2)
Total Protein: 7.8 g/dL (ref 6.5–8.1)

## 2019-05-19 ENCOUNTER — Other Ambulatory Visit (HOSPITAL_COMMUNITY)
Admission: RE | Admit: 2019-05-19 | Discharge: 2019-05-19 | Disposition: A | Discharge status: Home / Self Care | Payer: 59 | Source: Other Acute Inpatient Hospital | Attending: Internal Medicine | Admitting: Internal Medicine

## 2019-05-19 DIAGNOSIS — S0269XA Fracture of mandible of other specified site, initial encounter for closed fracture: Secondary | ICD-10-CM | POA: Insufficient documentation

## 2019-05-19 DIAGNOSIS — T8460XA Infection and inflammatory reaction due to internal fixation device of unspecified site, initial encounter: Secondary | ICD-10-CM | POA: Insufficient documentation

## 2019-05-19 LAB — COMPREHENSIVE METABOLIC PANEL
ALT: 57 U/L — ABNORMAL HIGH (ref 0–44)
AST: 48 U/L — ABNORMAL HIGH (ref 15–41)
Albumin: 4.5 g/dL (ref 3.5–5.0)
Alkaline Phosphatase: 104 U/L (ref 38–126)
Anion gap: 14 (ref 5–15)
BUN: 14 mg/dL (ref 6–20)
CO2: 22 mmol/L (ref 22–32)
Calcium: 9.8 mg/dL (ref 8.9–10.3)
Chloride: 99 mmol/L (ref 98–111)
Creatinine, Ser: 0.93 mg/dL (ref 0.61–1.24)
GFR calc Af Amer: >60 mL/min (ref >60)
GFR calc non Af Amer: >60 mL/min (ref >60)
Glucose, Bld: 89 mg/dL (ref 70–99)
Potassium: 3.6 mmol/L (ref 3.5–5.1)
Sodium: 135 mmol/L (ref 135–145)
Total Bilirubin: 1 mg/dL (ref 0.3–1.2)
Total Protein: 8.2 g/dL — ABNORMAL HIGH (ref 6.5–8.1)

## 2019-05-19 LAB — CBC WITH DIFFERENTIAL/PLATELET
Abs Immature Granulocytes: 0.02 10*3/uL (ref 0.00–0.07)
Basophils Absolute: 0 10*3/uL (ref 0.0–0.1)
Basophils Relative: 1 %
Eosinophils Absolute: 0.1 10*3/uL (ref 0.0–0.5)
Eosinophils Relative: 2 %
HCT: 44.9 % (ref 39.0–52.0)
Hemoglobin: 15.5 g/dL (ref 13.0–17.0)
Immature Granulocytes: 0 %
Lymphocytes Relative: 25 %
Lymphs Abs: 1.9 10*3/uL (ref 0.7–4.0)
MCH: 31.7 pg (ref 26.0–34.0)
MCHC: 34.5 g/dL (ref 30.0–36.0)
MCV: 91.8 fL (ref 80.0–100.0)
Monocytes Absolute: 1 10*3/uL (ref 0.1–1.0)
Monocytes Relative: 13 %
Neutro Abs: 4.7 10*3/uL (ref 1.7–7.7)
Neutrophils Relative %: 59 %
Platelets: 230 10*3/uL (ref 150–400)
RBC: 4.89 MIL/uL (ref 4.22–5.81)
RDW: 12.4 % (ref 11.5–15.5)
WBC: 7.8 10*3/uL (ref 4.0–10.5)
nRBC: 0 % (ref 0.0–0.2)

## 2019-05-20 ENCOUNTER — Ambulatory Visit: Payer: 59 | Admitting: Internal Medicine

## 2019-05-27 ENCOUNTER — Other Ambulatory Visit (HOSPITAL_COMMUNITY)
Admission: RE | Admit: 2019-05-27 | Discharge: 2019-05-27 | Disposition: A | Discharge status: Home / Self Care | Payer: 59 | Source: Other Acute Inpatient Hospital | Attending: Internal Medicine | Admitting: Internal Medicine

## 2019-05-27 DIAGNOSIS — T8460XA Infection and inflammatory reaction due to internal fixation device of unspecified site, initial encounter: Secondary | ICD-10-CM | POA: Insufficient documentation

## 2019-05-27 DIAGNOSIS — S0269XS Fracture of mandible of other specified site, sequela: Secondary | ICD-10-CM | POA: Insufficient documentation

## 2019-05-27 LAB — CBC WITH DIFFERENTIAL/PLATELET
Abs Immature Granulocytes: 0.02 10*3/uL (ref 0.00–0.07)
Basophils Absolute: 0.1 10*3/uL (ref 0.0–0.1)
Basophils Relative: 1 %
Eosinophils Absolute: 0.1 10*3/uL (ref 0.0–0.5)
Eosinophils Relative: 1 %
HCT: 43.2 % (ref 39.0–52.0)
Hemoglobin: 14.7 g/dL (ref 13.0–17.0)
Immature Granulocytes: 0 %
Lymphocytes Relative: 22 %
Lymphs Abs: 1.7 10*3/uL (ref 0.7–4.0)
MCH: 31.6 pg (ref 26.0–34.0)
MCHC: 34 g/dL (ref 30.0–36.0)
MCV: 92.9 fL (ref 80.0–100.0)
Monocytes Absolute: 1 10*3/uL (ref 0.1–1.0)
Monocytes Relative: 12 %
Neutro Abs: 5.1 10*3/uL (ref 1.7–7.7)
Neutrophils Relative %: 64 %
Platelets: 227 10*3/uL (ref 150–400)
RBC: 4.65 MIL/uL (ref 4.22–5.81)
RDW: 12.2 % (ref 11.5–15.5)
WBC: 8 10*3/uL (ref 4.0–10.5)
nRBC: 0 % (ref 0.0–0.2)

## 2019-05-27 LAB — COMPREHENSIVE METABOLIC PANEL
ALT: 50 U/L — ABNORMAL HIGH (ref 0–44)
AST: 44 U/L — ABNORMAL HIGH (ref 15–41)
Albumin: 4.5 g/dL (ref 3.5–5.0)
Alkaline Phosphatase: 91 U/L (ref 38–126)
Anion gap: 11 (ref 5–15)
BUN: 13 mg/dL (ref 6–20)
CO2: 24 mmol/L (ref 22–32)
Calcium: 9.5 mg/dL (ref 8.9–10.3)
Chloride: 100 mmol/L (ref 98–111)
Creatinine, Ser: 0.69 mg/dL (ref 0.61–1.24)
GFR calc Af Amer: >60 mL/min (ref >60)
GFR calc non Af Amer: >60 mL/min (ref >60)
Glucose, Bld: 85 mg/dL (ref 70–99)
Potassium: 3.6 mmol/L (ref 3.5–5.1)
Sodium: 135 mmol/L (ref 135–145)
Total Bilirubin: 0.5 mg/dL (ref 0.3–1.2)
Total Protein: 7.8 g/dL (ref 6.5–8.1)

## 2019-06-02 ENCOUNTER — Other Ambulatory Visit (HOSPITAL_COMMUNITY)
Admission: RE | Admit: 2019-06-02 | Discharge: 2019-06-02 | Disposition: A | Discharge status: Home / Self Care | Payer: 59 | Source: Other Acute Inpatient Hospital | Attending: Internal Medicine | Admitting: Internal Medicine

## 2019-06-02 DIAGNOSIS — T8460XA Infection and inflammatory reaction due to internal fixation device of unspecified site, initial encounter: Secondary | ICD-10-CM | POA: Diagnosis present

## 2019-06-02 DIAGNOSIS — S0269XA Fracture of mandible of other specified site, initial encounter for closed fracture: Secondary | ICD-10-CM | POA: Insufficient documentation

## 2019-06-02 LAB — CBC WITH DIFFERENTIAL/PLATELET
Abs Immature Granulocytes: 0.03 10*3/uL (ref 0.00–0.07)
Basophils Absolute: 0 10*3/uL (ref 0.0–0.1)
Basophils Relative: 0 %
Eosinophils Absolute: 0.1 10*3/uL (ref 0.0–0.5)
Eosinophils Relative: 1 %
HCT: 45.2 % (ref 39.0–52.0)
Hemoglobin: 15.6 g/dL (ref 13.0–17.0)
Immature Granulocytes: 0 %
Lymphocytes Relative: 21 %
Lymphs Abs: 1.8 10*3/uL (ref 0.7–4.0)
MCH: 31.8 pg (ref 26.0–34.0)
MCHC: 34.5 g/dL (ref 30.0–36.0)
MCV: 92.1 fL (ref 80.0–100.0)
Monocytes Absolute: 0.8 10*3/uL (ref 0.1–1.0)
Monocytes Relative: 10 %
Neutro Abs: 5.8 10*3/uL (ref 1.7–7.7)
Neutrophils Relative %: 68 %
Platelets: 197 10*3/uL (ref 150–400)
RBC: 4.91 MIL/uL (ref 4.22–5.81)
RDW: 12.3 % (ref 11.5–15.5)
WBC: 8.6 10*3/uL (ref 4.0–10.5)
nRBC: 0 % (ref 0.0–0.2)

## 2019-06-02 LAB — COMPREHENSIVE METABOLIC PANEL
ALT: 55 U/L — ABNORMAL HIGH (ref 0–44)
AST: 48 U/L — ABNORMAL HIGH (ref 15–41)
Albumin: 4.4 g/dL (ref 3.5–5.0)
Alkaline Phosphatase: 104 U/L (ref 38–126)
Anion gap: 12 (ref 5–15)
BUN: 12 mg/dL (ref 6–20)
CO2: 23 mmol/L (ref 22–32)
Calcium: 9.7 mg/dL (ref 8.9–10.3)
Chloride: 100 mmol/L (ref 98–111)
Creatinine, Ser: 0.81 mg/dL (ref 0.61–1.24)
GFR calc Af Amer: >60 mL/min (ref >60)
GFR calc non Af Amer: >60 mL/min (ref >60)
Glucose, Bld: 82 mg/dL (ref 70–99)
Potassium: 3.6 mmol/L (ref 3.5–5.1)
Sodium: 135 mmol/L (ref 135–145)
Total Bilirubin: 1.2 mg/dL (ref 0.3–1.2)
Total Protein: 7.9 g/dL (ref 6.5–8.1)

## 2019-06-09 ENCOUNTER — Other Ambulatory Visit (HOSPITAL_COMMUNITY)
Admission: RE | Admit: 2019-06-09 | Discharge: 2019-06-09 | Disposition: A | Discharge status: Home / Self Care | Payer: 59 | Source: Other Acute Inpatient Hospital | Attending: Internal Medicine | Admitting: Internal Medicine

## 2019-06-09 DIAGNOSIS — S0269XS Fracture of mandible of other specified site, sequela: Secondary | ICD-10-CM | POA: Insufficient documentation

## 2019-06-09 DIAGNOSIS — T8460XD Infection and inflammatory reaction due to internal fixation device of unspecified site, subsequent encounter: Secondary | ICD-10-CM | POA: Insufficient documentation

## 2019-06-09 LAB — CBC WITH DIFFERENTIAL/PLATELET
Abs Immature Granulocytes: 0.02 10*3/uL (ref 0.00–0.07)
Basophils Absolute: 0.1 10*3/uL (ref 0.0–0.1)
Basophils Relative: 1 %
Eosinophils Absolute: 0.1 10*3/uL (ref 0.0–0.5)
Eosinophils Relative: 1 %
HCT: 44.2 % (ref 39.0–52.0)
Hemoglobin: 15.1 g/dL (ref 13.0–17.0)
Immature Granulocytes: 0 %
Lymphocytes Relative: 25 %
Lymphs Abs: 1.7 10*3/uL (ref 0.7–4.0)
MCH: 31.7 pg (ref 26.0–34.0)
MCHC: 34.2 g/dL (ref 30.0–36.0)
MCV: 92.9 fL (ref 80.0–100.0)
Monocytes Absolute: 0.8 10*3/uL (ref 0.1–1.0)
Monocytes Relative: 12 %
Neutro Abs: 4.1 10*3/uL (ref 1.7–7.7)
Neutrophils Relative %: 61 %
Platelets: 210 10*3/uL (ref 150–400)
RBC: 4.76 MIL/uL (ref 4.22–5.81)
RDW: 12.5 % (ref 11.5–15.5)
WBC: 6.7 10*3/uL (ref 4.0–10.5)
nRBC: 0 % (ref 0.0–0.2)

## 2019-06-09 LAB — COMPREHENSIVE METABOLIC PANEL
ALT: 55 U/L — ABNORMAL HIGH (ref 0–44)
AST: 56 U/L — ABNORMAL HIGH (ref 15–41)
Albumin: 4.5 g/dL (ref 3.5–5.0)
Alkaline Phosphatase: 95 U/L (ref 38–126)
Anion gap: 13 (ref 5–15)
BUN: 10 mg/dL (ref 6–20)
CO2: 23 mmol/L (ref 22–32)
Calcium: 9.5 mg/dL (ref 8.9–10.3)
Chloride: 100 mmol/L (ref 98–111)
Creatinine, Ser: 0.75 mg/dL (ref 0.61–1.24)
GFR calc Af Amer: >60 mL/min (ref >60)
GFR calc non Af Amer: >60 mL/min (ref >60)
Glucose, Bld: 80 mg/dL (ref 70–99)
Potassium: 3.4 mmol/L — ABNORMAL LOW (ref 3.5–5.1)
Sodium: 136 mmol/L (ref 135–145)
Total Bilirubin: 1.2 mg/dL (ref 0.3–1.2)
Total Protein: 7.8 g/dL (ref 6.5–8.1)

## 2019-06-16 ENCOUNTER — Ambulatory Visit (INDEPENDENT_AMBULATORY_CARE_PROVIDER_SITE_OTHER): Payer: 59 | Admitting: Internal Medicine

## 2019-06-16 ENCOUNTER — Other Ambulatory Visit: Payer: Self-pay

## 2019-06-16 ENCOUNTER — Encounter: Payer: Self-pay | Admitting: Internal Medicine

## 2019-06-16 DIAGNOSIS — T8149XA Infection following a procedure, other surgical site, initial encounter: Secondary | ICD-10-CM | POA: Diagnosis not present

## 2019-06-16 MED ORDER — AMOXICILLIN-POT CLAVULANATE 875-125 MG PO TABS: 1.0000 | ORAL_TABLET | Freq: Two times a day (BID) | ORAL | 0 refills | Status: DC

## 2019-06-16 NOTE — Assessment & Plan Note (Signed)
He is improving following recent surgery and a second round of IV ertapenem.  I will change him to oral amoxicillin clavulanate and check his inflammatory markers today.  He will follow-up with me in 4 weeks after he has seen by his oral surgeon at [facility].

## 2019-06-16 NOTE — Progress Notes (Signed)
Per verbal order from Dr Megan Salon, 45 cm Single Lumen Peripherally Inserted Central Catheter removed from right basilic, tip intact after labs drawn. No sutures present. RN confirmed length per chart. Dressing was clean and dry. Petroleum dressing applied. Pt advised no heavy lifting with this arm, leave dressing for 24 hours and call the office or seek emergent care if dressing becomes soaked with blood or swelling or sharp pain presents. Patient verbalized understanding and agreement.  Patient's questions answered to their satisfaction. Patient tolerated procedure well, RN walked patient to check out.  RN notified Stanton Kidney at [facility] [employer]. Landis Gandy, RN

## 2019-06-16 NOTE — Progress Notes (Signed)
°  ° ° ° ° ° [facility] ° °Patient Active Problem List  ° Diagnosis Date Noted  °• Postoperative wound infection 02/18/2019  °  Priority: High  °• Bilateral fracture of mandible, closed, initial encounter ([facility]) 12/19/2018  °  Priority: High  °• Tobacco abuse   °• Alcohol withdrawal ([facility]) 12/19/2018  °• Hypertensive urgency 12/19/2018  °• Closed jaw fracture ([facility])   °• Tachycardia   ° ° °Patient's Medications  °New Prescriptions  ° AMOXICILLIN-CLAVULANATE (AUGMENTIN) 875-125 MG TABLET    Take 1 tablet by mouth 2 (two) times daily.  °Previous Medications  ° AMLODIPINE (NORVASC) 5 MG TABLET    Take 10 mg by mouth daily.   ° CHLORHEXIDINE (PERIDEX) 0.12 % SOLUTION    Use as directed 15 mLs in the mouth or throat 2 (two) times daily.  ° FEEDING SUPPLEMENT, ENSURE ENLIVE, (ENSURE ENLIVE) LIQD    Take 237 mLs by mouth 3 (three) times daily between meals.  ° IBUPROFEN (ADVIL) 100 MG/5ML SUSPENSION    Take 20 mLs (400 mg total) by mouth every 8 (eight) hours as needed for fever or moderate pain.  ° METOPROLOL TARTRATE (LOPRESSOR) 25 MG TABLET    Take 50 mg by mouth 2 (two) times daily.   ° MULTIPLE VITAMIN (MULTIVITAMIN) LIQD    Take 15 mLs by mouth daily.  ° NICOTINE (NICODERM CQ) 14 MG/24HR PATCH    Place 14 mg onto the skin daily.  °Modified Medications  ° No medications on file  °Discontinued Medications  ° ERTAPENEM 1,000 MG IN SODIUM CHLORIDE 0.9 % 100 ML    Inject 1,000 mg into the vein daily.  ° ° °Subjective: °Juan Crawford is in for his routine follow-up visit.  He suffered bilateral mandibular fractures after he had been drinking alcohol and fell down some steps 12/20/2018.    He underwent open reduction and internal fixation with maxillary mandibular fixation wiring.  He recalls being treated with oral clindamycin postoperatively for about 10 days.  The fixation wires remained in for the first month.  Postoperatively he developed drainage from his left neck incision.  He underwent removal of  the arch bars on 02/12/2019.  Operative cultures from his wound grew viridans streptococci.  He was placed back on clindamycin and completed 10 more days.  ° °I saw him on 02/18/2019 and we elected to have a PICC placed and he started on IV ertapenem on 02/24/2019.  He underwent a CT scan which showed no strong evidence of osteomyelitis or loosening of hardware but only minimal healing of his bilateral mandibular fractures.  He was referred to Dr. Scott Jensen, oral surgeon, who has referred him on to [facility] after determining that he is likely to need further surgery.   ° °He underwent surgery at [facility] on 04/28/2019.  Here are the reported operative findings. 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, defect measuring approximately 2.5 x 1.5 cm °- Bone fragments sent for culture and pathologic analysis °- Preserved lingual cortical plate at site of fracture °- Teeth #17, 18, 19 extracted in their entirety °- Marginal mandibular nerve not encountered in left neck dissection °- Right parasymphysis fracture superior border plate and screws were exposed intraorally and removed completely without exposure of the inferior border plate °- Occlusion stable and repeatable with coincident maxillary and mandibular midlines °- Hemostatic at the end of the case  ° °Operative cultures grew strep   anginosis.  He has now completed 6 weeks of postoperative IV ertapenem.  He has not had any problems tolerating his antibiotic or PICC.  He is feeling much better with the exception of continued numbness on the left side of his jaw.  He will not be able to start eating solid food until next week. ° ° ° °Review of Systems: °Review of Systems  °Constitutional: Negative for fever.  °Gastrointestinal: Negative for abdominal pain, diarrhea, nausea and vomiting.  ° ° °Past Medical History:  °Diagnosis Date  °• Hypertension   °• Nicotine dependence   ° ° °Social History    ° °Tobacco Use  °• Smoking status: Current Every Day Smoker  °  Packs/day: 1.00  °  Years: 20.00  °  Pack years: 20.00  °  Types: Cigarettes  °• Smokeless tobacco: Never Used  °Substance Use Topics  °• Alcohol use: Not Currently  °  Alcohol/week: 6.0 standard drinks  °  Types: 6 Shots of liquor per week  °• Drug use: Not Currently  °  Frequency: 3.0 times per week  °  Types: Marijuana  °  Comment: last smoked 02/11/2019  ° ° °Family History  °Problem Relation Age of Onset  °• Diabetes Neg Hx   ° ° °No Known Allergies ° °Objective: °Vitals:  ° 06/16/19 1514  °BP: (!) 166/96  °Pulse: (!) 102  °Weight: 178 lb (80.7 kg)  ° °Body mass index is 23.48 kg/m². ° °Physical Exam °Constitutional:   °   Comments: He is in good spirits.  °HENT:  °   Mouth/Throat:  °   Comments: His left neck incision is healing nicely. ° ° ° °Lab Results °Sed Rate (mm/h)  °Date Value  °02/18/2019 25 (H)  ° °CRP (mg/L)  °Date Value  °02/18/2019 6.4  °  °Problem List Items Addressed This Visit   °  ° High  ° Postoperative wound infection  °  He is improving following recent surgery and a second round of IV ertapenem.  I will change him to oral amoxicillin clavulanate and check his inflammatory markers today.  He will follow-up with me in 4 weeks after he has seen by his oral surgeon at [facility]. °  °  ° Relevant Medications  ° amoxicillin-clavulanate (AUGMENTIN) 875-125 MG tablet  ° Other Relevant Orders  ° C-reactive protein  ° Sed Rate (ESR)  °  ° ° ° °John Campbell, MD °[facility] °[facility] °[phone] pager   [phone] cell °06/16/2019, 4:52 PM °

## 2019-06-17 LAB — C-REACTIVE PROTEIN: CRP: 2.6 mg/L (ref <8.0)

## 2019-06-17 LAB — SEDIMENTATION RATE: Sed Rate: 25 mm/h — ABNORMAL HIGH (ref 0–15)

## 2019-07-21 ENCOUNTER — Ambulatory Visit (INDEPENDENT_AMBULATORY_CARE_PROVIDER_SITE_OTHER): Payer: 59 | Admitting: Internal Medicine

## 2019-07-21 ENCOUNTER — Other Ambulatory Visit: Payer: Self-pay

## 2019-07-21 ENCOUNTER — Encounter: Payer: Self-pay | Admitting: Internal Medicine

## 2019-07-21 DIAGNOSIS — T8149XA Infection following a procedure, other surgical site, initial encounter: Secondary | ICD-10-CM | POA: Diagnosis not present

## 2019-07-21 NOTE — Progress Notes (Signed)
[facility] for Infectious Disease  Patient Active Problem List   Diagnosis Date Noted  . Postoperative wound infection 02/18/2019    Priority: High  . Bilateral fracture of mandible, closed, initial encounter ([name]) 12/19/2018    Priority: High  . Tobacco abuse   . Alcohol withdrawal ([name]) 12/19/2018  . Hypertensive urgency 12/19/2018  . Closed jaw fracture ([name])   . Tachycardia     Patient's Medications  New Prescriptions   No medications on file  Previous Medications   AMLODIPINE (NORVASC) 5 MG TABLET    Take 10 mg by mouth daily.    BENAZEPRIL (LOTENSIN) 10 MG TABLET    Take by mouth.   CHLORHEXIDINE (PERIDEX) 0.12 % SOLUTION    Use as directed 15 mLs in the mouth or throat 2 (two) times daily.   FEEDING SUPPLEMENT, ENSURE ENLIVE, (ENSURE ENLIVE) LIQD    Take 237 mLs by mouth 3 (three) times daily between meals.   IBUPROFEN (ADVIL) 100 MG/5ML SUSPENSION    Take 20 mLs (400 mg total) by mouth every 8 (eight) hours as needed for fever or moderate pain.   METOPROLOL TARTRATE (LOPRESSOR) 25 MG TABLET    Take 50 mg by mouth 2 (two) times daily.    MULTIPLE VITAMIN (MULTIVITAMIN) LIQD    Take 15 mLs by mouth daily.   NICOTINE (NICODERM CQ) 14 MG/24HR PATCH    Place 14 mg onto the skin daily.  Modified Medications   No medications on file  Discontinued Medications   AMOXICILLIN-CLAVULANATE (AUGMENTIN) 875-125 MG TABLET    Take 1 tablet by mouth 2 (two) times daily.    Subjective: Juan Crawford is in for his routine follow-up visit.  He suffered bilateral mandibular fractures after he had been drinking alcohol and fell down some steps 12/20/2018.    He underwent open reduction and internal fixation with maxillary mandibular fixation wiring.  He recalls being treated with oral clindamycin postoperatively for about 10 days.  The fixation wires remained in for the first month.  Postoperatively he developed drainage from his left neck incision.  He underwent removal of the arch bars  on 02/12/2019.  Operative cultures from his wound grew viridans streptococci.  He was placed back on clindamycin and completed 10 more days.   I saw him on 02/18/2019 and we elected to have a PICC placed and he started on IV ertapenem on 02/24/2019.  He underwent a CT scan which showed no strong evidence of osteomyelitis or loosening of hardware but only minimal healing of his bilateral mandibular fractures.  He was referred to Dr. Diona Browner, oral surgeon, who has referred him on to [facility] after determining that he is likely to need further surgery.    He underwent surgery at [facility] on 04/28/2019.  Here are the reported operative findings.  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  He  completed 6 weeks of postoperative IV ertapenem on 06/16/2019 before switching to oral amoxicillin clavulanate.  He has not had any problems tolerating his antibiotic.  He is feeling much better with the exception of continued numbness on the left side of his jaw.    He saw Dr. Mauri Pole, his oral surgeon at [facility], last week and was told that he was doing well and that he could start advancing his diet.   Review of Systems: Review of Systems  Constitutional: Negative for fever.  Gastrointestinal: Negative for abdominal pain, diarrhea, nausea and vomiting.     Past Medical History:  Diagnosis Date  . Hypertension   . Nicotine dependence     Social History   Tobacco Use  . Smoking status: Current Every Day Smoker    Packs/day: 1.00    Years: 20.00    Pack years: 20.00    Types: E-cigarettes  . Smokeless tobacco: Never Used  Substance Use Topics  . Alcohol use: Not Currently    Alcohol/week: 6.0 standard drinks    Types: 6 Shots of liquor per week  . Drug use: Not Currently    Frequency: 3.0 times per week    Types: Marijuana    Comment: last smoked 02/11/2019    Family History  Problem Relation Age of Onset  . Diabetes Neg Hx     No Known Allergies  Objective: Vitals:   07/21/19 1543  BP: (!) 160/90  Pulse: 100  Temp: 98.6 F (37 C)  TempSrc: Oral  Weight: 179 lb (81.2 kg)   Body mass index is 23.62 kg/m.  Physical Exam Constitutional:      Comments: He is in good spirits.  HENT:     Mouth/Throat:     Comments: His left neck incision is healing nicely.  His left mandibular molars have been excised.  He has no open intraoral lesions.    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          Cliffton Asters, MD [facility] for Infectious Disease [facility] [phone] pager   [phone] cell 07/21/2019, 4:20 PM

## 2019-07-21 NOTE — Assessment & Plan Note (Signed)
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

## 2019-09-15 ENCOUNTER — Ambulatory Visit (INDEPENDENT_AMBULATORY_CARE_PROVIDER_SITE_OTHER): Payer: 59 | Admitting: Internal Medicine

## 2019-09-15 ENCOUNTER — Encounter: Payer: Self-pay | Admitting: Internal Medicine

## 2019-09-15 ENCOUNTER — Other Ambulatory Visit: Payer: Self-pay

## 2019-09-15 DIAGNOSIS — T8149XA Infection following a procedure, other surgical site, initial encounter: Secondary | ICD-10-CM | POA: Diagnosis not present

## 2019-09-15 NOTE — Assessment & Plan Note (Signed)
His infection has been cured. He can follow up here as needed. 

## 2019-09-15 NOTE — Progress Notes (Signed)
[facility] [place] for Infectious Disease  Patient Active Problem List   Diagnosis Date Noted  . Postoperative wound infection 02/18/2019    Priority: High  . Bilateral fracture of mandible, closed, initial encounter ([place]) 12/19/2018    Priority: High  . Tobacco abuse   . Alcohol withdrawal ([place]) 12/19/2018  . Hypertensive urgency 12/19/2018  . Closed jaw fracture ([place])   . Tachycardia     Patient's Medications  New Prescriptions   No medications on file  Previous Medications   AMLODIPINE (NORVASC) 10 MG TABLET    Take 10 mg by mouth daily.   BENAZEPRIL (LOTENSIN) 10 MG TABLET    Take by mouth.   CHLORHEXIDINE (PERIDEX) 0.12 % SOLUTION    Use as directed 15 mLs in the mouth or throat 2 (two) times daily.   FEEDING SUPPLEMENT, ENSURE ENLIVE, (ENSURE ENLIVE) LIQD    Take 237 mLs by mouth 3 (three) times daily between meals.   IBUPROFEN (ADVIL) 100 MG/5ML SUSPENSION    Take 20 mLs (400 mg total) by mouth every 8 (eight) hours as needed for fever or moderate pain.   METOPROLOL TARTRATE (LOPRESSOR) 50 MG TABLET    Take 50 mg by mouth 2 (two) times daily.   MULTIPLE VITAMIN (MULTIVITAMIN) LIQD    Take 15 mLs by mouth daily.   NICOTINE (NICODERM CQ) 14 MG/24HR PATCH    Place 14 mg onto the skin daily.  Modified Medications   No medications on file  Discontinued Medications   AMLODIPINE (NORVASC) 5 MG TABLET    Take 10 mg by mouth daily.    METOPROLOL TARTRATE (LOPRESSOR) 25 MG TABLET    Take 50 mg by mouth 2 (two) times daily.     Subjective: Juan Crawford is in for his routine follow-up visit.  He completed a long course of antibiotics for his mandibular osteomyelitis on 07/21/2019.  He is not having any new pain or swelling to suggest relapse of infection.  He has been able to advance his diet to solid food without difficulty.  Review of Systems: Review of Systems  Constitutional: Negative for fever.  HENT:       As noted in HPI    Past Medical History:  Diagnosis Date  .  Hypertension   . Nicotine dependence     Social History   Tobacco Use  . Smoking status: Current Every Day Smoker    Packs/day: 1.00    Years: 20.00    Pack years: 20.00    Types: E-cigarettes  . Smokeless tobacco: Never Used  Substance Use Topics  . Alcohol use: Not Currently    Alcohol/week: 6.0 standard drinks    Types: 6 Shots of liquor per week  . Drug use: Not Currently    Frequency: 3.0 times per week    Types: Marijuana    Comment: last smoked 02/11/2019    Family History  Problem Relation Age of Onset  . Diabetes Neg Hx     No Known Allergies  Objective: Vitals:   09/15/19 1519  BP: (!) 154/90  Pulse: 98  Temp: 98.6 F (37 C)  TempSrc: Oral  SpO2: 100%  Weight: 184 lb (83.5 kg)  Height: 6' (1.829 m)   Body mass index is 24.95 kg/m.  Physical Exam Constitutional:      Comments: He is in good spirits.  HENT:     Mouth/Throat:     Comments: His left neck surgical incision is healed without any  evidence of active infection.  He has no trismus.  He has no open intraoral lesions.    Lab Results    Problem List Items Addressed This Visit      High   Postoperative wound infection    His infection has been cured.  He can follow-up here as needed.          Cliffton Asters, MD [facility] for Infectious Disease [facility] [phone] pager   [phone] cell 09/15/2019, 3:34 PM

## 2020-10-25 IMAGING — CT CT NECK WITH CONTRAST
2 of 4 series · 5 of 14 positions shown, 6 images · IV contrast (iopamidol)
Comparison: CT head face and cervical spine 12/19/2018.

CLINICAL DATA: 37-year-old male status post fall down stairs with
jaw fracture, ORIF [REDACTED]. Query osteomyelitis.

EXAM:
CT NECK WITH CONTRAST
TECHNIQUE: Multidetector CT imaging of the neck was performed using the
standard protocol following the bolus administration of intravenous
contrast.
CONTRAST:  75mL [id number] IOPAMIDOL ([id number]-[id number]) INJECTION 61%

[Series 3: neck · axial · 0.48mm/px · z∈[-308,-220]mm · 2 of 133 slices shown, 3 images]
[im 45/133  soft-tissue]
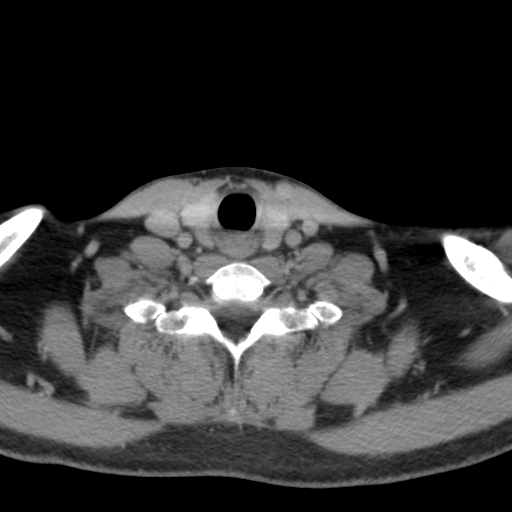
[im 45/133  bone]
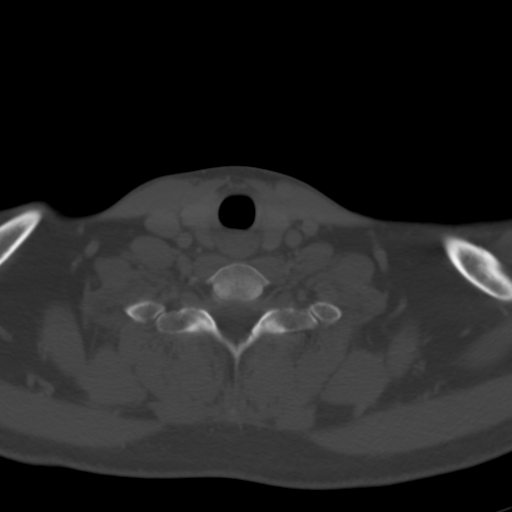
[im 89/133  bone]
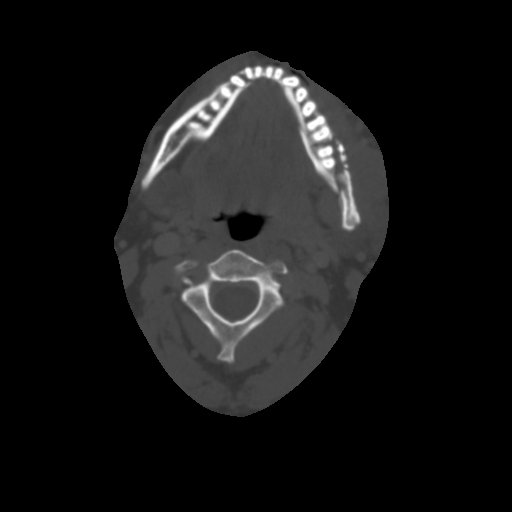

[Series 8: angled axial-oropharynx · axial · 0.39mm/px · z∈[-360,-224]mm · 3 of 145 slices shown]
[im 37/145  bone]
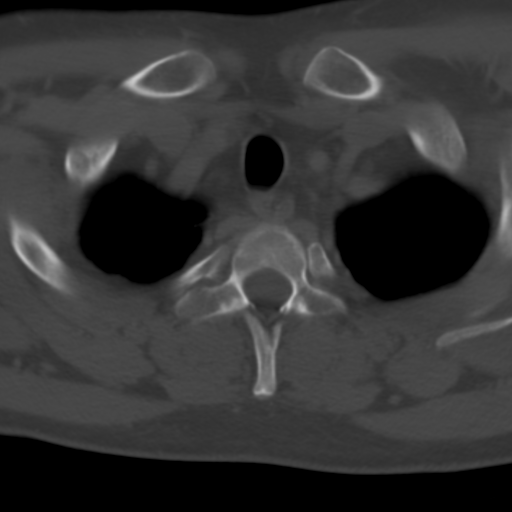
[im 73/145  bone]
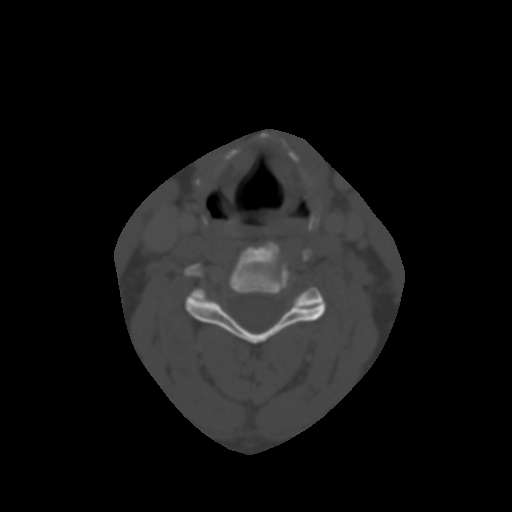
[im 109/145  bone]
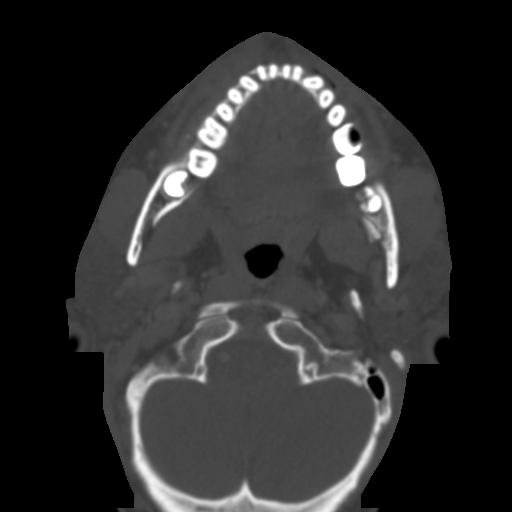

[5 of 14 positions shown; findings below may reference images not displayed]

FINDINGS: Pharynx and larynx: Laryngeal and pharyngeal soft tissue contours
are within normal limits. The parapharyngeal and retropharyngeal
spaces are normal.

Salivary glands: Negative sublingual space. Parotid glands and
submandibular glands remain within normal limits. Asymmetry of the
left masticator space, and involving some of the left submandibular
space is described with the mandible below.

Thyroid: Negative.

Lymph nodes: Asymmetrically increased subcentimeter left side lymph
nodes, individually up to 8 millimeters short axis. No cystic or
necrotic nodes. The left level 1 B and level 2 stations are
primarily affected.

Vascular: Major vascular structures in the neck and at the skull
base are patent including the left IJ. The right vertebral artery is
dominant.

Limited intracranial: Negative.

Visualized orbits: Negative.

Mastoids and visualized paranasal sinuses: Visualized paranasal
sinuses and mastoids are stable and well pneumatized.

Skeleton: Fractures of the mandible at the right anterior body and
left posterior body and angle have been treated with plates and
screws/pins. At the right mandible fracture there is only lucency
along the medial most screw/10 at the symphysis. But the fracture
lucency there remains visible.

At the left mandible fracture there is some periosteal new bone
formation anteriorly and inferiorly but no bridging bone. And there
is pronounced lucency of bone lateral to the posterior left mandible
dentition, such as on coronal image 33. The anterior left mandible
molar is carious as before (coronal image 27). However, compared to
the preoperative appearance on 12/19/2018, no permeative or definite
aggressive osteolysis is identified. The mandible condyles remain
normally located.

There is regional soft tissue thickening including in the superior
left submandibular space and in the lower left masticator space. And
there is a percutaneous drain or iodine gauze in place in the left
submandibular space. There is no fluid collection identified.

The cervical spine and other osseous structures appear stable.

Upper chest: Negative visible superior mediastinum and lungs.
IMPRESSION: 1. Although there is minimal healing of the mandible fractures -
especially on the left - there is no strong evidence of
osteomyelitis by CT. And hardware remains in place without obvious
loosening.
2. There is soft tissue swelling about the left mandible, and a
percutaneous drain or iodine gauze in the left submandibular space,
with no fluid collection identified.
3. Reactive appearing left level 1 and level 2 lymph nodes.

## 2022-06-06 ENCOUNTER — Other Ambulatory Visit: Payer: Self-pay

## 2022-06-06 ENCOUNTER — Emergency Department (HOSPITAL_COMMUNITY): Payer: 59

## 2022-06-06 ENCOUNTER — Emergency Department (HOSPITAL_COMMUNITY)
Admission: EM | Admit: 2022-06-06 | Discharge: 2022-06-06 | Disposition: A | Discharge status: Home / Self Care | Payer: 59 | Attending: Emergency Medicine | Admitting: Emergency Medicine

## 2022-06-06 ENCOUNTER — Encounter (HOSPITAL_COMMUNITY): Payer: Self-pay

## 2022-06-06 DIAGNOSIS — Y900 Blood alcohol level of less than 20 mg/100 ml: Secondary | ICD-10-CM | POA: Insufficient documentation

## 2022-06-06 DIAGNOSIS — R251 Tremor, unspecified: Secondary | ICD-10-CM | POA: Insufficient documentation

## 2022-06-06 DIAGNOSIS — R0789 Other chest pain: Secondary | ICD-10-CM | POA: Insufficient documentation

## 2022-06-06 DIAGNOSIS — F10230 Alcohol dependence with withdrawal, uncomplicated: Secondary | ICD-10-CM | POA: Diagnosis not present

## 2022-06-06 DIAGNOSIS — Z1152 Encounter for screening for COVID-19: Secondary | ICD-10-CM | POA: Insufficient documentation

## 2022-06-06 DIAGNOSIS — R000 Tachycardia, unspecified: Secondary | ICD-10-CM | POA: Diagnosis not present

## 2022-06-06 DIAGNOSIS — Z79899 Other long term (current) drug therapy: Secondary | ICD-10-CM | POA: Insufficient documentation

## 2022-06-06 DIAGNOSIS — I10 Essential (primary) hypertension: Secondary | ICD-10-CM | POA: Insufficient documentation

## 2022-06-06 DIAGNOSIS — F419 Anxiety disorder, unspecified: Secondary | ICD-10-CM | POA: Insufficient documentation

## 2022-06-06 DIAGNOSIS — E876 Hypokalemia: Secondary | ICD-10-CM | POA: Diagnosis not present

## 2022-06-06 DIAGNOSIS — F10930 Alcohol use, unspecified with withdrawal, uncomplicated: Secondary | ICD-10-CM

## 2022-06-06 LAB — COMPREHENSIVE METABOLIC PANEL
ALT: 128 U/L — ABNORMAL HIGH (ref 0–44)
AST: 148 U/L — ABNORMAL HIGH (ref 15–41)
Albumin: 4.6 g/dL (ref 3.5–5.0)
Alkaline Phosphatase: 71 U/L (ref 38–126)
Anion gap: 17 — ABNORMAL HIGH (ref 5–15)
BUN: 6 mg/dL (ref 6–20)
CO2: 27 mmol/L (ref 22–32)
Calcium: 10 mg/dL (ref 8.9–10.3)
Chloride: 93 mmol/L — ABNORMAL LOW (ref 98–111)
Creatinine, Ser: 0.83 mg/dL (ref 0.61–1.24)
GFR, Estimated: >60 mL/min (ref >60)
Glucose, Bld: 122 mg/dL — ABNORMAL HIGH (ref 70–99)
Potassium: 3.1 mmol/L — ABNORMAL LOW (ref 3.5–5.1)
Sodium: 137 mmol/L (ref 135–145)
Total Bilirubin: 2.5 mg/dL — ABNORMAL HIGH (ref 0.3–1.2)
Total Protein: 8.1 g/dL (ref 6.5–8.1)

## 2022-06-06 LAB — CBC WITH DIFFERENTIAL/PLATELET
Abs Immature Granulocytes: 0.02 10*3/uL (ref 0.00–0.07)
Basophils Absolute: 0.1 10*3/uL (ref 0.0–0.1)
Basophils Relative: 1 %
Eosinophils Absolute: 0 10*3/uL (ref 0.0–0.5)
Eosinophils Relative: 0 %
HCT: 46.3 % (ref 39.0–52.0)
Hemoglobin: 16.9 g/dL (ref 13.0–17.0)
Immature Granulocytes: 0 %
Lymphocytes Relative: 9 %
Lymphs Abs: 0.7 10*3/uL (ref 0.7–4.0)
MCH: 34.9 pg — ABNORMAL HIGH (ref 26.0–34.0)
MCHC: 36.5 g/dL — ABNORMAL HIGH (ref 30.0–36.0)
MCV: 95.7 fL (ref 80.0–100.0)
Monocytes Absolute: 0.8 10*3/uL (ref 0.1–1.0)
Monocytes Relative: 10 %
Neutro Abs: 6.1 10*3/uL (ref 1.7–7.7)
Neutrophils Relative %: 80 %
Platelets: 156 10*3/uL (ref 150–400)
RBC: 4.84 MIL/uL (ref 4.22–5.81)
RDW: 13.9 % (ref 11.5–15.5)
WBC: 7.7 10*3/uL (ref 4.0–10.5)
nRBC: 0 % (ref 0.0–0.2)

## 2022-06-06 LAB — LIPASE, BLOOD: Lipase: 48 U/L (ref 11–51)

## 2022-06-06 LAB — RESP PANEL BY RT-PCR (FLU A&B, COVID) ARPGX2
Influenza A by PCR: NEGATIVE
Influenza B by PCR: NEGATIVE
SARS Coronavirus 2 by RT PCR: NEGATIVE

## 2022-06-06 LAB — RAPID URINE DRUG SCREEN, HOSP PERFORMED
Amphetamines: NOT DETECTED
Barbiturates: NOT DETECTED
Benzodiazepines: NOT DETECTED
Cocaine: NOT DETECTED
Opiates: NOT DETECTED
Tetrahydrocannabinol: POSITIVE — AB

## 2022-06-06 LAB — TROPONIN I (HIGH SENSITIVITY)
Troponin I (High Sensitivity): 10 ng/L (ref <18)
Troponin I (High Sensitivity): 10 ng/L (ref <18)

## 2022-06-06 LAB — ACETAMINOPHEN LEVEL: Acetaminophen (Tylenol), Serum: <10 ug/mL — ABNORMAL LOW (ref 10–30)

## 2022-06-06 LAB — SALICYLATE LEVEL: Salicylate Lvl: <7 mg/dL — ABNORMAL LOW (ref 7.0–30.0)

## 2022-06-06 LAB — ETHANOL: Alcohol, Ethyl (B): <10 mg/dL (ref <10)

## 2022-06-06 LAB — MAGNESIUM: Magnesium: 1.5 mg/dL — ABNORMAL LOW (ref 1.7–2.4)

## 2022-06-06 MED ORDER — CHLORDIAZEPOXIDE HCL 25 MG PO CAPS
100.0000 mg | ORAL_CAPSULE | Freq: Once | ORAL | Status: AC
  Administered 2022-06-06: 100 mg via ORAL
  Filled 2022-06-06: qty 4

## 2022-06-06 MED ORDER — LORAZEPAM 1 MG PO TABS: 0.0000 mg | ORAL_TABLET | Freq: Two times a day (BID) | ORAL | Status: DC

## 2022-06-06 MED ORDER — MAGNESIUM SULFATE 2 GM/50ML IV SOLN
2.0000 g | Freq: Once | INTRAVENOUS | Status: AC
  Administered 2022-06-06: 2 g via INTRAVENOUS
  Filled 2022-06-06: qty 50

## 2022-06-06 MED ORDER — LORAZEPAM 2 MG/ML IJ SOLN
1.0000 mg | Freq: Once | INTRAMUSCULAR | Status: AC
  Administered 2022-06-06: 1 mg via INTRAVENOUS
  Filled 2022-06-06: qty 1

## 2022-06-06 MED ORDER — CHLORDIAZEPOXIDE HCL 25 MG PO CAPS: ORAL_CAPSULE | ORAL | 0 refills | Status: DC

## 2022-06-06 MED ORDER — SODIUM CHLORIDE 0.9 % IV BOLUS
1000.0000 mL | Freq: Once | INTRAVENOUS | Status: AC
  Administered 2022-06-06: 1000 mL via INTRAVENOUS

## 2022-06-06 MED ORDER — LORAZEPAM 1 MG PO TABS
2.0000 mg | ORAL_TABLET | Freq: Once | ORAL | Status: AC
  Administered 2022-06-06: 2 mg via ORAL
  Filled 2022-06-06: qty 2

## 2022-06-06 MED ORDER — POTASSIUM CHLORIDE CRYS ER 20 MEQ PO TBCR
40.0000 meq | EXTENDED_RELEASE_TABLET | Freq: Once | ORAL | Status: AC
  Administered 2022-06-06: 40 meq via ORAL
  Filled 2022-06-06: qty 2

## 2022-06-06 MED ORDER — LORAZEPAM 1 MG PO TABS
0.0000 mg | ORAL_TABLET | Freq: Four times a day (QID) | ORAL | Status: DC
  Administered 2022-06-06: 4 mg via ORAL
  Filled 2022-06-06: qty 4

## 2022-06-06 MED ORDER — POTASSIUM CHLORIDE 10 MEQ/100ML IV SOLN
10.0000 meq | Freq: Once | INTRAVENOUS | Status: AC
  Administered 2022-06-06: 10 meq via INTRAVENOUS
  Filled 2022-06-06: qty 100

## 2022-06-06 MED ORDER — IOHEXOL 350 MG/ML SOLN
75.0000 mL | Freq: Once | INTRAVENOUS | Status: AC | PRN
  Administered 2022-06-06: 75 mL via INTRAVENOUS

## 2022-06-06 MED ORDER — LABETALOL HCL 5 MG/ML IV SOLN
10.0000 mg | Freq: Once | INTRAVENOUS | Status: AC
  Administered 2022-06-06: 10 mg via INTRAVENOUS
  Filled 2022-06-06: qty 4

## 2022-06-06 NOTE — ED Provider Notes (Signed)
[facility] EMERGENCY DEPARTMENT Provider Note   CSN: [id number] Arrival date & time: 06/06/22  1031     History  Chief Complaint  Patient presents with   Delirium Tremens (DTS)   Chest Pain    Juan Crawford is a 41 y.o. male.  Pt is a 41 yo male with a pmhx significant for HTN, tobacco abuse, and alcohol abuse.  Pt is supposed to be on amlodipine, cozaar, and lopressor.  He has not taken any of his bp medication since Feb.  Pt also admits to drinking a pint of alcohol a day.  He last drank yesterday evening around 1030 pm.  Pt has been having cp which is what prompted him to come to the ED.  Pt does feel shaky.  He received 4 mg ativan po in triage which has helped a little.       Home Medications Prior to Admission medications   Medication Sig Start Date End Date Taking? Authorizing Provider  amLODipine (NORVASC) 10 MG tablet Take 10 mg by mouth daily. 08/28/19   [provider]  benazepril (LOTENSIN) 10 MG tablet Take by mouth. 07/20/19   [provider]  chlorhexidine (PERIDEX) 0.12 % solution Use as directed 15 mLs in the mouth or throat 2 (two) times daily. 01/11/19   Gildardo Pounds, NP  feeding supplement, ENSURE ENLIVE, (ENSURE ENLIVE) LIQD Take 237 mLs by mouth 3 (three) times daily between meals. 12/24/18   Barton Dubois, MD  ibuprofen (ADVIL) 100 MG/5ML suspension Take 20 mLs (400 mg total) by mouth every 8 (eight) hours as needed for fever or moderate pain. 01/07/19   Gildardo Pounds, NP  metoprolol tartrate (LOPRESSOR) 50 MG tablet Take 50 mg by mouth 2 (two) times daily. 09/07/19   [provider]  Multiple Vitamin (MULTIVITAMIN) LIQD Take 15 mLs by mouth daily. 12/25/18   Barton Dubois, MD  nicotine (NICODERM CQ) 14 mg/24hr patch Place 14 mg onto the skin daily.    [provider]      Allergies    Patient has no known allergies.    Review of Systems   Review of Systems  Cardiovascular:  Positive for  chest pain.  All other systems reviewed and are negative.   Physical Exam Updated Vital Signs BP (!) 165/107   Pulse (!) 101   Temp 98.4 F (36.9 C) (Oral)   Resp 19   Ht 6' (1.829 m)   Wt 83.5 kg   SpO2 98%   BMI 24.95 kg/m  Physical Exam Vitals and nursing note reviewed.  Constitutional:      General: He is in acute distress.  HENT:     Head: Normocephalic and atraumatic.  Eyes:     Extraocular Movements: Extraocular movements intact.     Pupils: Pupils are equal, round, and reactive to light.  Cardiovascular:     Rate and Rhythm: Regular rhythm. Tachycardia present.     Heart sounds: Normal heart sounds.  Pulmonary:     Effort: Pulmonary effort is normal.     Breath sounds: Normal breath sounds.  Abdominal:     General: Bowel sounds are normal.     Palpations: Abdomen is soft.  Musculoskeletal:        General: Normal range of motion.     Cervical back: Normal range of motion and neck supple.  Skin:    General: Skin is warm.     Capillary Refill: Capillary refill takes less than 2 seconds.  Neurological:     General: No focal deficit present.     Mental Status: He is alert and oriented to person, place, and time.     Motor: Tremor present.  Psychiatric:        Mood and Affect: Mood is anxious.        Behavior: Behavior normal.     ED Results / Procedures / Treatments   Labs (all labs ordered are listed, but only abnormal results are displayed) Labs Reviewed  COMPREHENSIVE METABOLIC PANEL - Abnormal; Notable for the following components:      Result Value   Potassium 3.1 (*)    Chloride 93 (*)    Glucose, Bld 122 (*)    AST 148 (*)    ALT 128 (*)    Total Bilirubin 2.5 (*)    Anion gap 17 (*)    All other components within normal limits  RAPID URINE DRUG SCREEN, HOSP PERFORMED - Abnormal; Notable for the following components:   Tetrahydrocannabinol POSITIVE (*)    All other components within normal limits  CBC WITH DIFFERENTIAL/PLATELET - Abnormal;  Notable for the following components:   MCH 34.9 (*)    MCHC 36.5 (*)    All other components within normal limits  ACETAMINOPHEN LEVEL - Abnormal; Notable for the following components:   Acetaminophen (Tylenol), Serum <10 (*)    All other components within normal limits  SALICYLATE LEVEL - Abnormal; Notable for the following components:   Salicylate Lvl <7.0 (*)    All other components within normal limits  MAGNESIUM - Abnormal; Notable for the following components:   Magnesium 1.5 (*)    All other components within normal limits  RESP PANEL BY RT-PCR (FLU A&B, COVID) ARPGX2  ETHANOL  LIPASE, BLOOD  VITAMIN B1  TROPONIN I (HIGH SENSITIVITY)  TROPONIN I (HIGH SENSITIVITY)    EKG EKG Interpretation  Date/Time:  Wednesday June 06 2022 12:20:25 EST Ventricular Rate:  139 PR Interval:  116 QRS Duration: 76 QT Interval:  358 QTC Calculation: 544 R Axis:   -28 Text Interpretation: Sinus tachycardia Minimal voltage criteria for LVH, may be normal variant ( R in aVL ) No significant change since last tracing When compared with ECG of 20-Dec-2018 07:02, PREVIOUS ECG IS PRESENT Confirmed by Gwyneth Sprout ([id number]) on 06/06/2022 12:23:25 PM  Radiology DG Chest Portable 1 View  Result Date: 06/06/2022 CLINICAL DATA:  Chest pain EXAM: PORTABLE CHEST 1 VIEW COMPARISON:  12/23/18 FINDINGS: The right costophrenic angle is excluded from the field of view. Within this limitation there is no pleural effusion. No pneumothorax. No focal airspace opacity. Normal cardiac and mediastinal contours. No displaced rib fractures. IMPRESSION: No radiographic finding to explain chest pain. Electronically Signed   By: Lorenza Cambridge M.D.   On: 06/06/2022 14:54    Procedures Procedures    Medications Ordered in ED Medications  LORazepam (ATIVAN) tablet 0-4 mg (4 mg Oral Given 06/06/22 1237)    Followed by  LORazepam (ATIVAN) tablet 0-4 mg (has no administration in time range)  sodium chloride 0.9 %  bolus 1,000 mL (0 mLs Intravenous Stopped 06/06/22 1611)  LORazepam (ATIVAN) injection 1 mg (1 mg Intravenous Given 06/06/22 1502)  magnesium sulfate IVPB 2 g 50 mL (0 g Intravenous Stopped 06/06/22 1611)  labetalol (NORMODYNE) injection 10 mg (10 mg Intravenous Given 06/06/22 1457)  potassium chloride 10 mEq in 100 mL IVPB (0 mEq Intravenous Stopped 06/06/22 1611)  potassium chloride SA (KLOR-CON M) CR tablet 40 mEq (40  mEq Oral Given 06/06/22 1456)  iohexol (OMNIPAQUE) 350 MG/ML injection 75 mL (75 mLs Intravenous Contrast Given 06/06/22 1630)    ED Course/ Medical Decision Making/ A&P                           Medical Decision Making Amount and/or Complexity of Data Reviewed Radiology: ordered.  Risk Prescription drug management.   This patient presents to the ED for concern of cp, this involves an extensive number of treatment options, and is a complaint that carries with it a high risk of complications and morbidity.  The differential diagnosis includes cardiac, pulm, gi, etoh w/dr   Co morbidities that complicate the patient evaluation  HTN, tobacco abuse, and alcohol abuse   Additional history obtained:  Additional history obtained from epic chart review External records from outside source obtained and reviewed including mom   Lab Tests:  I Ordered, and personally interpreted labs.  The pertinent results include:  cmp with k low at 3.1, cbc nl, mg low at 1.5, etoh neg; covid/flu neg   Imaging Studies ordered:  I ordered imaging studies including cxr  I independently visualized and interpreted imaging which showed  CXR:  nothing acute I agree with the radiologist interpretation   Cardiac Monitoring:  The patient was maintained on a cardiac monitor.  I personally viewed and interpreted the cardiac monitored which showed an underlying rhythm of: sinus tachy   Medicines ordered and prescription drug management:  I ordered medication including ativan  for w/dr sx   Reevaluation of the patient after these medicines showed that the patient improved I have reviewed the patients home medicines and have made adjustments as needed   Test Considered:  ct   Critical Interventions:  Ivfs/ativan    Problem List / ED Course:  Hypokalemia and hypomagnesemia:  replaced CP:  atypical.  CT chest pending.  Pt signed out to Dr. Tyrone Nine at shift change. Etoh abuse:  pt given ativan and ivfs   Reevaluation:  After the interventions noted above, I reevaluated the patient and found that they have :improved   Social Determinants of Health:  Lives at home   Dispostion:  After consideration of the diagnostic results and the patients response to treatment, I feel that the patent would benefit from discharge with outpatient f/u.          Final Clinical Impression(s) / ED Diagnoses Final diagnoses:  Alcohol withdrawal syndrome without complication (HCC)  Hypokalemia  Hypomagnesemia  Atypical chest pain    Rx / DC Orders ED Discharge Orders     None         Isla Pence, MD 06/06/22 [phone]

## 2022-06-06 NOTE — ED Provider Triage Note (Addendum)
Emergency Medicine Provider Triage Evaluation Note  Juan Crawford , a 41 y.o. male  was evaluated in triage.  Pt complains of chest pain along the substernal area of his chest.  Patient reports going into detox from alcohol.  He is used to drinking 1 pint daily and has so for the last several years.  Prior hospitalization for alcohol withdrawal, however none of these required ICU intervention.  His last alcoholic intake was last night around 10:30 PM.  Patient is tachycardic, hypertensive in the triage room..  Review of Systems  Positive: Chest pain, tachycardia Negative: Sob, nausea, vomiting  Physical Exam  There were no vitals taken for this visit. Gen:   Awake, no distress   Resp:  Normal effort  MSK:   Moves extremities without difficulty  Other:  Severe Dts noted on evaluation,pain along chest non reproducible, abdomen is soft  Medical Decision Making  Medically screening exam initiated at 12:19 PM.  Appropriate orders placed.  Charmaine Downs was informed that the remainder of the evaluation will be completed by another provider, this initial triage assessment does not replace that evaluation, and the importance of remaining in the ED until their evaluation is complete.  Patient Needs room ASAP, message sent to [facility].   Ativan 4 mg given in triage.    Claude Manges, PA-C 06/06/22 1225    Claude Manges, PA-C 06/06/22 1228

## 2022-06-06 NOTE — ED Provider Notes (Signed)
41 year old male with a chief complaint of chest pain.  This been going on for a few days now.  Concern for possible pulmonary embolism plan to await for CT angiogram of the chest.  Patient was seen by Dr. Particia Nearing and I received them in signout.  On repeat assessment patient's heart rate has improved.  He does feel better.  He would like to go home.  He still having some left-sided chest discomfort.  Patient more likely in mild alcohol withdrawal.  He has not had any hallucinations confusion or seizures.  We will treat him for possible gastritis.  Given information for outpatient follow-up.  Librium taper.   Melene Plan, DO 06/06/22 1702

## 2022-06-06 NOTE — ED Triage Notes (Signed)
Patient reports chest pain x 3 days.  Reports he drinks heavily daily and last drink was around 1630 yesterday.  Patient is in extreme detox.  Patient has tremors. Denies seizures from withdraw.  Reports drinks a pint a day.

## 2022-06-06 NOTE — ED Notes (Signed)
All discharge instructions reviewed with patient including follow up care and prescriptions. Patient verbalized understanding of same and had no other questions. Patient stable and ambulatory at time of discharge.  

## 2022-06-06 NOTE — ED Notes (Signed)
Patient transported to CT 

## 2022-06-06 NOTE — TOC CAGE-AID Note (Signed)
Transition of Care [facility]) - CAGE-AID Screening   Patient Details  Name: Juan Crawford MRN: [id number] Date of Birth: Nov 17, 1980  Transition of Care [facility]) CM/SW Contact:    Erling Arrazola C Tarpley-Carter, LCSWA Phone Number: 06/06/2022, 4:37 PM   Clinical Narrative: Pt participated in Cage-Aid.  Pt stated he drinks ETOH.  Pt was offered resources, due to usage of ETOH.     Ellery Meroney Tarpley-Carter, MSW, LCSW-A Pronouns:  She/Her/Hers [facility] Health[facility]itions of Care Clinical Social Worker Direct Number:  [phone] [email or web address]   CAGE-AID Screening:    Have You Ever Felt You Ought to Cut Down on Your Drinking or Drug Use?: Yes Have People Annoyed You By Critizing Your Drinking Or Drug Use?: No Have You Felt Bad Or Guilty About Your Drinking Or Drug Use?: Yes Have You Ever Had a Drink or Used Drugs First Thing In The Morning to Steady Your Nerves or to Get Rid of a Hangover?: No CAGE-AID Score: 2  Substance Abuse Education Offered: Yes  Substance abuse interventions: Transport planner

## 2022-06-06 NOTE — Discharge Instructions (Addendum)
Try pepcid or tagamet up to twice a day.  Try to avoid things that may make this worse, most commonly these are spicy foods tomato based products fatty foods chocolate and peppermint.  Alcohol and tobacco can also make this worse.  Return to the emergency department for sudden worsening pain fever or inability to eat or drink.  

## 2022-06-08 LAB — VITAMIN B1: Vitamin B1 (Thiamine): 93.8 nmol/L (ref 66.5–200.0)

## 2023-07-28 ENCOUNTER — Emergency Department (HOSPITAL_COMMUNITY): Payer: 59

## 2023-07-28 ENCOUNTER — Other Ambulatory Visit: Payer: Self-pay

## 2023-07-28 ENCOUNTER — Inpatient Hospital Stay (HOSPITAL_COMMUNITY)
Admission: EM | Admit: 2023-07-28 | Discharge: 2023-08-05 | DRG: 438 | Disposition: A | Discharge status: Home / Self Care | Payer: 59 | Attending: Internal Medicine | Admitting: Internal Medicine

## 2023-07-28 DIAGNOSIS — E43 Unspecified severe protein-calorie malnutrition: Secondary | ICD-10-CM | POA: Diagnosis present

## 2023-07-28 DIAGNOSIS — K7031 Alcoholic cirrhosis of liver with ascites: Secondary | ICD-10-CM

## 2023-07-28 DIAGNOSIS — K852 Alcohol induced acute pancreatitis without necrosis or infection: Principal | ICD-10-CM

## 2023-07-28 DIAGNOSIS — Z6826 Body mass index (BMI) 26.0-26.9, adult: Secondary | ICD-10-CM

## 2023-07-28 DIAGNOSIS — E872 Acidosis, unspecified: Secondary | ICD-10-CM | POA: Diagnosis present

## 2023-07-28 DIAGNOSIS — Z79899 Other long term (current) drug therapy: Secondary | ICD-10-CM

## 2023-07-28 DIAGNOSIS — R188 Other ascites: Secondary | ICD-10-CM | POA: Diagnosis not present

## 2023-07-28 DIAGNOSIS — F17210 Nicotine dependence, cigarettes, uncomplicated: Secondary | ICD-10-CM | POA: Diagnosis present

## 2023-07-28 DIAGNOSIS — E876 Hypokalemia: Secondary | ICD-10-CM | POA: Insufficient documentation

## 2023-07-28 DIAGNOSIS — K859 Acute pancreatitis without necrosis or infection, unspecified: Secondary | ICD-10-CM

## 2023-07-28 DIAGNOSIS — E440 Moderate protein-calorie malnutrition: Secondary | ICD-10-CM | POA: Insufficient documentation

## 2023-07-28 DIAGNOSIS — E869 Volume depletion, unspecified: Secondary | ICD-10-CM | POA: Diagnosis present

## 2023-07-28 DIAGNOSIS — J189 Pneumonia, unspecified organism: Secondary | ICD-10-CM | POA: Diagnosis present

## 2023-07-28 DIAGNOSIS — E8809 Other disorders of plasma-protein metabolism, not elsewhere classified: Secondary | ICD-10-CM | POA: Diagnosis present

## 2023-07-28 DIAGNOSIS — F419 Anxiety disorder, unspecified: Secondary | ICD-10-CM | POA: Diagnosis present

## 2023-07-28 DIAGNOSIS — R791 Abnormal coagulation profile: Secondary | ICD-10-CM | POA: Diagnosis present

## 2023-07-28 DIAGNOSIS — K700 Alcoholic fatty liver: Secondary | ICD-10-CM | POA: Diagnosis present

## 2023-07-28 DIAGNOSIS — F1090 Alcohol use, unspecified, uncomplicated: Secondary | ICD-10-CM

## 2023-07-28 DIAGNOSIS — K7011 Alcoholic hepatitis with ascites: Secondary | ICD-10-CM | POA: Diagnosis present

## 2023-07-28 DIAGNOSIS — R600 Localized edema: Secondary | ICD-10-CM | POA: Diagnosis present

## 2023-07-28 DIAGNOSIS — R7989 Other specified abnormal findings of blood chemistry: Secondary | ICD-10-CM | POA: Diagnosis present

## 2023-07-28 DIAGNOSIS — E871 Hypo-osmolality and hyponatremia: Secondary | ICD-10-CM | POA: Insufficient documentation

## 2023-07-28 DIAGNOSIS — D509 Iron deficiency anemia, unspecified: Secondary | ICD-10-CM | POA: Diagnosis present

## 2023-07-28 DIAGNOSIS — K863 Pseudocyst of pancreas: Secondary | ICD-10-CM | POA: Diagnosis present

## 2023-07-28 DIAGNOSIS — F1010 Alcohol abuse, uncomplicated: Secondary | ICD-10-CM

## 2023-07-28 DIAGNOSIS — I10 Essential (primary) hypertension: Secondary | ICD-10-CM | POA: Diagnosis present

## 2023-07-28 DIAGNOSIS — F17290 Nicotine dependence, other tobacco product, uncomplicated: Secondary | ICD-10-CM | POA: Diagnosis present

## 2023-07-28 DIAGNOSIS — M7989 Other specified soft tissue disorders: Secondary | ICD-10-CM | POA: Diagnosis present

## 2023-07-28 DIAGNOSIS — Z720 Tobacco use: Secondary | ICD-10-CM | POA: Diagnosis present

## 2023-07-28 LAB — CBC WITH DIFFERENTIAL/PLATELET
Abs Immature Granulocytes: 0.06 10*3/uL (ref 0.00–0.07)
Basophils Absolute: 0 10*3/uL (ref 0.0–0.1)
Basophils Relative: 0 %
Eosinophils Absolute: 0 10*3/uL (ref 0.0–0.5)
Eosinophils Relative: 0 %
HCT: 38.5 % — ABNORMAL LOW (ref 39.0–52.0)
Hemoglobin: 13.3 g/dL (ref 13.0–17.0)
Immature Granulocytes: 1 %
Lymphocytes Relative: 9 %
Lymphs Abs: 1.1 10*3/uL (ref 0.7–4.0)
MCH: 31.8 pg (ref 26.0–34.0)
MCHC: 34.5 g/dL (ref 30.0–36.0)
MCV: 92.1 fL (ref 80.0–100.0)
Monocytes Absolute: 1.4 10*3/uL — ABNORMAL HIGH (ref 0.1–1.0)
Monocytes Relative: 11 %
Neutro Abs: 10.1 10*3/uL — ABNORMAL HIGH (ref 1.7–7.7)
Neutrophils Relative %: 79 %
Platelets: 326 10*3/uL (ref 150–400)
RBC: 4.18 MIL/uL — ABNORMAL LOW (ref 4.22–5.81)
RDW: 14.2 % (ref 11.5–15.5)
WBC: 12.7 10*3/uL — ABNORMAL HIGH (ref 4.0–10.5)
nRBC: 0 % (ref 0.0–0.2)

## 2023-07-28 LAB — AMMONIA: Ammonia: 41 umol/L — ABNORMAL HIGH (ref 9–35)

## 2023-07-28 LAB — COMPREHENSIVE METABOLIC PANEL
ALT: 28 U/L (ref 0–44)
ALT: 25 U/L (ref 0–44)
AST: 74 U/L — ABNORMAL HIGH (ref 15–41)
AST: 62 U/L — ABNORMAL HIGH (ref 15–41)
Albumin: 2.1 g/dL — ABNORMAL LOW (ref 3.5–5.0)
Albumin: 1.9 g/dL — ABNORMAL LOW (ref 3.5–5.0)
Alkaline Phosphatase: 324 U/L — ABNORMAL HIGH (ref 38–126)
Alkaline Phosphatase: 330 U/L — ABNORMAL HIGH (ref 38–126)
Anion gap: 17 — ABNORMAL HIGH (ref 5–15)
Anion gap: 14 (ref 5–15)
BUN: <5 mg/dL — ABNORMAL LOW (ref 6–20)
BUN: 6 mg/dL (ref 6–20)
CO2: 27 mmol/L (ref 22–32)
CO2: 26 mmol/L (ref 22–32)
Calcium: 7.7 mg/dL — ABNORMAL LOW (ref 8.9–10.3)
Calcium: 7.4 mg/dL — ABNORMAL LOW (ref 8.9–10.3)
Chloride: 86 mmol/L — ABNORMAL LOW (ref 98–111)
Chloride: 89 mmol/L — ABNORMAL LOW (ref 98–111)
Creatinine, Ser: 0.69 mg/dL (ref 0.61–1.24)
Creatinine, Ser: 0.72 mg/dL (ref 0.61–1.24)
GFR, Estimated: >60 mL/min (ref >60)
GFR, Estimated: >60 mL/min (ref >60)
Glucose, Bld: 169 mg/dL — ABNORMAL HIGH (ref 70–99)
Glucose, Bld: 103 mg/dL — ABNORMAL HIGH (ref 70–99)
Potassium: 2.8 mmol/L — ABNORMAL LOW (ref 3.5–5.1)
Potassium: 3.1 mmol/L — ABNORMAL LOW (ref 3.5–5.1)
Sodium: 130 mmol/L — ABNORMAL LOW (ref 135–145)
Sodium: 129 mmol/L — ABNORMAL LOW (ref 135–145)
Total Bilirubin: 1 mg/dL (ref <1.2)
Total Bilirubin: 1.2 mg/dL — ABNORMAL HIGH (ref <1.2)
Total Protein: 6.5 g/dL (ref 6.5–8.1)
Total Protein: 5.8 g/dL — ABNORMAL LOW (ref 6.5–8.1)

## 2023-07-28 LAB — I-STAT CG4 LACTIC ACID, ED: Lactic Acid, Venous: 2.3 mmol/L (ref 0.5–1.9)

## 2023-07-28 LAB — LIPASE, BLOOD
Lipase: 182 U/L — ABNORMAL HIGH (ref 11–51)
Lipase: 176 U/L — ABNORMAL HIGH (ref 11–51)

## 2023-07-28 LAB — BRAIN NATRIURETIC PEPTIDE: B Natriuretic Peptide: 23.4 pg/mL (ref 0.0–100.0)

## 2023-07-28 LAB — MAGNESIUM: Magnesium: 1.8 mg/dL (ref 1.7–2.4)

## 2023-07-28 LAB — ETHANOL: Alcohol, Ethyl (B): 180 mg/dL — ABNORMAL HIGH (ref <10)

## 2023-07-28 LAB — PROTIME-INR
INR: 1.2 (ref 0.8–1.2)
Prothrombin Time: 15.4 s — ABNORMAL HIGH (ref 11.4–15.2)

## 2023-07-28 MED ORDER — IOHEXOL 350 MG/ML SOLN
75.0000 mL | Freq: Once | INTRAVENOUS | Status: AC | PRN
  Administered 2023-07-28: 75 mL via INTRAVENOUS

## 2023-07-28 MED ORDER — POTASSIUM CHLORIDE CRYS ER 20 MEQ PO TBCR
40.0000 meq | EXTENDED_RELEASE_TABLET | Freq: Once | ORAL | Status: AC
  Administered 2023-07-28: 40 meq via ORAL
  Filled 2023-07-28: qty 2

## 2023-07-28 MED ORDER — THIAMINE MONONITRATE 100 MG PO TABS
100.0000 mg | ORAL_TABLET | Freq: Every day | ORAL | Status: DC
  Administered 2023-07-28 – 2023-08-05 (×9): 100 mg via ORAL
  Filled 2023-07-28 (×9): qty 1

## 2023-07-28 MED ORDER — LIDOCAINE 5 % EX PTCH
1.0000 | MEDICATED_PATCH | CUTANEOUS | Status: DC
  Administered 2023-07-28 – 2023-08-04 (×8): 1 via TRANSDERMAL
  Filled 2023-07-28 (×8): qty 1

## 2023-07-28 MED ORDER — SODIUM CHLORIDE 0.9 % IV BOLUS: 250.0000 mL | Freq: Once | INTRAVENOUS | Status: DC

## 2023-07-28 MED ORDER — AMLODIPINE BESYLATE 10 MG PO TABS
10.0000 mg | ORAL_TABLET | Freq: Every day | ORAL | Status: DC
  Administered 2023-07-28 – 2023-07-29 (×2): 10 mg via ORAL
  Filled 2023-07-28: qty 2
  Filled 2023-07-28: qty 1

## 2023-07-28 MED ORDER — THIAMINE HCL 100 MG/ML IJ SOLN
100.0000 mg | Freq: Every day | INTRAMUSCULAR | Status: DC
  Filled 2023-07-28 (×2): qty 2

## 2023-07-28 MED ORDER — ADULT MULTIVITAMIN W/MINERALS CH
1.0000 | ORAL_TABLET | Freq: Every day | ORAL | Status: DC
  Administered 2023-07-28 – 2023-08-05 (×9): 1 via ORAL
  Filled 2023-07-28 (×9): qty 1

## 2023-07-28 MED ORDER — LORAZEPAM 1 MG PO TABS
1.0000 mg | ORAL_TABLET | ORAL | Status: DC | PRN
Start: 2023-07-28 — End: 2023-07-31
  Administered 2023-07-31: 1 mg via ORAL
  Filled 2023-07-28: qty 1

## 2023-07-28 MED ORDER — NICOTINE 7 MG/24HR TD PT24
7.0000 mg | MEDICATED_PATCH | Freq: Every day | TRANSDERMAL | Status: DC
  Administered 2023-07-28 – 2023-07-29 (×2): 7 mg via TRANSDERMAL
  Filled 2023-07-28 (×3): qty 1

## 2023-07-28 MED ORDER — POTASSIUM CHLORIDE 10 MEQ/100ML IV SOLN
10.0000 meq | INTRAVENOUS | Status: DC
  Administered 2023-07-28: 10 meq via INTRAVENOUS
  Filled 2023-07-28: qty 100

## 2023-07-28 MED ORDER — POTASSIUM CHLORIDE CRYS ER 20 MEQ PO TBCR
40.0000 meq | EXTENDED_RELEASE_TABLET | Freq: Once | ORAL | Status: AC
Start: 2023-07-29 — End: 2023-07-29
  Administered 2023-07-29: 40 meq via ORAL
  Filled 2023-07-28: qty 2

## 2023-07-28 MED ORDER — ENOXAPARIN SODIUM 40 MG/0.4ML IJ SOSY: 40.0000 mg | PREFILLED_SYRINGE | INTRAMUSCULAR | Status: DC

## 2023-07-28 MED ORDER — LORAZEPAM 2 MG/ML IJ SOLN
1.0000 mg | INTRAMUSCULAR | Status: DC | PRN
  Administered 2023-07-30 (×3): 1 mg via INTRAVENOUS
  Filled 2023-07-28 (×3): qty 1

## 2023-07-28 MED ORDER — POTASSIUM CHLORIDE 10 MEQ/100ML IV SOLN
10.0000 meq | INTRAVENOUS | Status: AC
  Administered 2023-07-28: 10 meq via INTRAVENOUS
  Filled 2023-07-28 (×2): qty 100

## 2023-07-28 MED ORDER — POTASSIUM CHLORIDE 10 MEQ/100ML IV SOLN: 10.0000 meq | Freq: Once | INTRAVENOUS | Status: DC

## 2023-07-28 MED ORDER — POTASSIUM CHLORIDE 10 MEQ/100ML IV SOLN
10.0000 meq | INTRAVENOUS | Status: AC
  Administered 2023-07-29 (×2): 10 meq via INTRAVENOUS
  Filled 2023-07-28 (×2): qty 100

## 2023-07-28 MED ORDER — MAGNESIUM SULFATE 2 GM/50ML IV SOLN
2.0000 g | Freq: Once | INTRAVENOUS | Status: AC
  Administered 2023-07-29: 2 g via INTRAVENOUS
  Filled 2023-07-28: qty 50

## 2023-07-28 MED ORDER — FOLIC ACID 1 MG PO TABS
1.0000 mg | ORAL_TABLET | Freq: Every day | ORAL | Status: DC
  Administered 2023-07-28 – 2023-08-05 (×9): 1 mg via ORAL
  Filled 2023-07-28 (×9): qty 1

## 2023-07-28 NOTE — ED Provider Triage Note (Cosign Needed)
Emergency Medicine Provider Triage Evaluation Note  Juan Crawford , a 42 y.o. male  was evaluated in triage.  Pt complains of abdominal pain and bloatingx2 weeks. Reports reduced appetite. Drinking 1/5/day. Endorses vomiting 2 days ago. No N/D. No hematemesis. Last BM this AM. Also having swelling of legs. No SOB.  Review of Systems  Positive: Abdominal pain Negative: hematemesis  Physical Exam  BP (!) 144/96 (BP Location: Right Arm)   Pulse (!) 141   Temp 98.5 F (36.9 C)   Resp 18   Ht 6\' 1"  (1.854 m)   Wt 90.7 kg   SpO2 95%   BMI 26.39 kg/m  Gen:   Awake, no distress   Resp:  Normal effort  MSK:   Moves extremities without difficulty  Other:  +ascites  Medical Decision Making  Medically screening exam initiated at 11:25 AM.  Appropriate orders placed.  Charmaine Downs was informed that the remainder of the evaluation will be completed by another provider, this initial triage assessment does not replace that evaluation, and the importance of remaining in the ED until their evaluation is complete.    Pete Pelt, [place] 07/28/23 1127

## 2023-07-28 NOTE — ED Notes (Signed)
 Pt called x3 for vital update no answer will try again.

## 2023-07-28 NOTE — Hospital Course (Signed)
Ascites -bloating past 3 weeks -edema in bilateral legs -no stomach pain - held off on abx  Alcohol use disorder -1/5 liquor a day, last drink 4p today  GI consulted   HTN  Tachycardic Elevated lactate  Hypokalemia  Mild pancreatitis?

## 2023-07-28 NOTE — ED Provider Notes (Cosign Needed Addendum)
[place] EMERGENCY DEPARTMENT AT [facility] Provider Note   CSN: [id number] Arrival date & time: 07/28/23  1026     History  Chief Complaint  Patient presents with   Bloated   Leg Swelling   Abdominal Pain   Alcohol Problem    Juan Crawford is a 42 y.o. male with alcohol abuse disorder presents with abdominal bloating x 3 weeks.  Describes some abdominal discomfort without vomiting or diarrhea.  States he drinks approximately 1/5 of liquor a day.  No chest pain no shortness of breath today.   Abdominal Pain Alcohol Problem Associated symptoms include abdominal pain.         Home Medications Prior to Admission medications   Medication Sig Start Date End Date Taking? Authorizing Provider  amLODipine (NORVASC) 10 MG tablet Take 10 mg by mouth daily. 08/28/19   [provider]  benazepril (LOTENSIN) 10 MG tablet Take by mouth. 07/20/19   [provider]  chlordiazePOXIDE (LIBRIUM) 25 MG capsule 50mg  PO TID x 1D, then 25-50mg  PO BID X 1D, then 25-50mg  PO QD X 1D 06/06/22   Melene Plan, DO  chlorhexidine (PERIDEX) 0.12 % solution Use as directed 15 mLs in the mouth or throat 2 (two) times daily. 01/11/19   Claiborne Rigg, NP  feeding supplement, ENSURE ENLIVE, (ENSURE ENLIVE) LIQD Take 237 mLs by mouth 3 (three) times daily between meals. 12/24/18   Vassie Loll, MD  ibuprofen (ADVIL) 100 MG/5ML suspension Take 20 mLs (400 mg total) by mouth every 8 (eight) hours as needed for fever or moderate pain. 01/07/19   Claiborne Rigg, NP  metoprolol tartrate (LOPRESSOR) 50 MG tablet Take 50 mg by mouth 2 (two) times daily. 09/07/19   [provider]  Multiple Vitamin (MULTIVITAMIN) LIQD Take 15 mLs by mouth daily. 12/25/18   Vassie Loll, MD  nicotine (NICODERM CQ) 14 mg/24hr patch Place 14 mg onto the skin daily.    [provider]      Allergies    Patient has no known allergies.    Review of Systems   Review of Systems   Gastrointestinal:  Positive for abdominal pain.    Physical Exam Updated Vital Signs BP (!) 148/103 (BP Location: Right Arm)   Pulse (!) 133   Temp 98.5 F (36.9 C)   Resp 17   Ht 6\' 1"  (1.854 m)   Wt 90.7 kg   SpO2 97%   BMI 26.39 kg/m  Physical Exam Vitals and nursing note reviewed.  Constitutional:      General: He is not in acute distress.    Appearance: He is well-developed.  HENT:     Head: Normocephalic and atraumatic.  Eyes:     Conjunctiva/sclera: Conjunctivae normal.  Cardiovascular:     Rate and Rhythm: Normal rate and regular rhythm.     Heart sounds: No murmur heard. Pulmonary:     Effort: Pulmonary effort is normal. No respiratory distress.     Breath sounds: Normal breath sounds.  Abdominal:     General: Bowel sounds are normal. There is distension.     Palpations: Abdomen is soft. There is fluid wave.     Tenderness: There is no abdominal tenderness.     Comments: No peritoneal signs  Musculoskeletal:     Cervical back: Neck supple.     Right lower leg: Edema present.     Left lower leg: Edema present.  Skin:    General: Skin is warm and dry.  Capillary Refill: Capillary refill takes less than 2 seconds.  Neurological:     Mental Status: He is alert.  Psychiatric:        Mood and Affect: Mood normal.     ED Results / Procedures / Treatments   Labs (all labs ordered are listed, but only abnormal results are displayed) Labs Reviewed  COMPREHENSIVE METABOLIC PANEL - Abnormal; Notable for the following components:      Result Value   Sodium 130 (*)    Potassium 2.8 (*)    Chloride 86 (*)    Glucose, Bld 169 (*)    BUN <5 (*)    Calcium 7.7 (*)    Albumin 2.1 (*)    AST 74 (*)    Alkaline Phosphatase 324 (*)    Anion gap 17 (*)    All other components within normal limits  ETHANOL - Abnormal; Notable for the following components:   Alcohol, Ethyl (B) 180 (*)    All other components within normal limits  CBC WITH  DIFFERENTIAL/PLATELET - Abnormal; Notable for the following components:   WBC 12.7 (*)    RBC 4.18 (*)    HCT 38.5 (*)    Neutro Abs 10.1 (*)    Monocytes Absolute 1.4 (*)    All other components within normal limits  CULTURE, BLOOD (ROUTINE X 2)  CULTURE, BLOOD (ROUTINE X 2)  URINALYSIS, ROUTINE W REFLEX MICROSCOPIC  MAGNESIUM  AMMONIA  LIPASE, BLOOD  BRAIN NATRIURETIC PEPTIDE  PROTIME-INR  I-STAT CG4 LACTIC ACID, ED    EKG None  Radiology No results found.  Procedures Procedures    Medications Ordered in ED Medications  LORazepam (ATIVAN) tablet 1-4 mg (has no administration in time range)    Or  LORazepam (ATIVAN) injection 1-4 mg (has no administration in time range)  thiamine (VITAMIN B1) tablet 100 mg (has no administration in time range)    Or  thiamine (VITAMIN B1) injection 100 mg (has no administration in time range)  folic acid (FOLVITE) tablet 1 mg (has no administration in time range)  multivitamin with minerals tablet 1 tablet (has no administration in time range)  potassium chloride SA (KLOR-CON M) CR tablet 40 mEq (has no administration in time range)  potassium chloride 10 mEq in 100 mL IVPB (has no administration in time range)    ED Course/ Medical Decision Making/ A&P                                 Medical Decision Making Amount and/or Complexity of Data Reviewed Labs: ordered.  Risk OTC drugs. Prescription drug management.   This patient presents to the ED with chief complaint(s) of abdominal bloating.  The complaint involves an extensive differential diagnosis and also carries with it a high risk of complications and morbidity.   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  No history of prior ascites. Tachycardic without chest pain or SOB, likely early withdraws.   Independent ECG interpretation:  Pending  Independent labs interpretation:  The following labs were independently interpreted:  Labs notable for leukocytosis of 12.7,  hypokalemia 2.8,, albumin of 2.1 and AST 74, alk phos 324, anion gap of 17  Independent visualization and interpretation of imaging: I independently visualized the following imaging with scope of interpretation limited to determining acute life threatening conditions related to emergency care: CT abdomen pelvis, which is pending  Treatment and Reassessment: Pending  Consultations obtained:   pending  Disposition:   Signout given to PA Ryley, Alison Murray, discharge pending workup.  Social Determinants of Health:   none  This note was dictated with voice recognition software.  Despite best efforts at proofreading, errors may have occurred which can change the documentation meaning.          Final Clinical Impression(s) / ED Diagnoses Final diagnoses:  Other ascites    Rx / DC Orders ED Discharge Orders     None         Halford Decamp, PA-C 07/28/23 1533    Melene Plan, DO 07/28/23 1539    Halford Decamp, PA-C 07/28/23 1607    Melene Plan, DO 07/29/23 [phone]

## 2023-07-28 NOTE — ED Notes (Signed)
ED TO INPATIENT HANDOFF REPORT  ED Nurse Name and Phone #:  Corliss Blacker, RN [phone]  S Name/Age/Gender Juan Crawford 42 y.o. male Room/Bed: 035C/035C  Code Status   Code Status: Prior  Home/SNF/Other Home Patient oriented to: self, place, time, and situation Is this baseline? Yes   Triage Complete: Triage complete  Chief Complaint Ascites [R18.8]  Triage Note Pt. Ststed, Im not feeling good. Im an alcoholic and Ive had bloating in my stomach for 2 weeks and swelling in both legs .   Allergies No Known Allergies  Level of Care/Admitting Diagnosis ED Disposition     ED Disposition  Admit   Condition  --   Comment  Hospital Area: [facility] [[id number]]  Level of Care: Telemetry Medical [104]  May place patient in observation at [facility] or [facility] if equivalent level of care is available:: No  Covid Evaluation: Asymptomatic - no recent exposure (last 10 days) testing not required  Diagnosis: Ascites [743709]  Admitting Physician: Briscoe Burns [[id number]]  Attending Physician: Briscoe Burns [[id number]]          B Medical/Surgery History Past Medical History:  Diagnosis Date   Hypertension    Nicotine dependence    Past Surgical History:  Procedure Laterality Date   CYST EXCISION Left 02/12/2019   Procedure: DEBRIDEMENT NECK WOUND AND PLACEMENT OF DRAIN;  Surgeon: Christia Reading, MD;  Location: [facility];  Service: ENT;  Laterality: Left;   FACIAL LACERATION REPAIR N/A 12/21/2018   Procedure: Facial Laceration Repair;  Surgeon: Christia Reading, MD;  Location: [facility] OR;  Service: ENT;  Laterality: N/A;  3cm wound on chin   HAND SURGERY  2001   MANDIBULAR HARDWARE REMOVAL Bilateral 02/12/2019   Procedure: MANDIBULAR HARDWARE REMOVAL;  Surgeon: Christia Reading, MD;  Location: [facility];  Service: ENT;  Laterality: Bilateral;   ORIF MANDIBULAR FRACTURE N/A 12/21/2018   Procedure: MAXILLO-MANDIBULAR FIXATION AND ORIF OF  MANDIBLE FRACTURE;  Surgeon: Christia Reading, MD;  Location: [facility] OR;  Service: ENT;  Laterality: N/A;     A IV Location/Drains/Wounds Patient Lines/Drains/Airways Status     Active Line/Drains/Airways     Name Placement date Placement time Site Days   Peripheral IV 07/28/23 20 G Right Antecubital 07/28/23  1630  Antecubital  less than 1   PICC Single Lumen 02/24/19 PICC Right Basilic 45 cm 02/24/19  1331  Basilic  1615   Open Drain 1 Left;Lateral Neck  02/12/19  1130  Neck  1627   Incision (Closed) 12/21/18 Lip Other (Comment) 12/21/18  1125  -- 1680   Incision (Closed) 12/21/18 Neck Left 12/21/18  1125  -- 1680   Incision (Closed) 02/12/19 Neck Left 02/12/19  1151  -- 1627   Incision (Closed) 02/12/19 Lip Other (Comment) 02/12/19  1202  -- 1627            Intake/Output Last 24 hours No intake or output data in the 24 hours ending 07/28/23 1903  Labs/Imaging Results for orders placed or performed during the hospital encounter of 07/28/23 (from the past 48 hours)  Comprehensive metabolic panel     Status: Abnormal   Collection Time: 07/28/23 11:35 AM  Result Value Ref Range   Sodium 130 (L) 135 - 145 mmol/L   Potassium 2.8 (L) 3.5 - 5.1 mmol/L   Chloride 86 (L) 98 - 111 mmol/L   CO2 27 22 - 32 mmol/L   Glucose, Bld 169 (H) 70 - 99  mg/dL    Comment: Glucose reference range applies only to samples taken after fasting for at least 8 hours.   BUN <5 (L) 6 - 20 mg/dL   Creatinine, Ser 1.61 0.61 - 1.24 mg/dL   Calcium 7.7 (L) 8.9 - 10.3 mg/dL   Total Protein 6.5 6.5 - 8.1 g/dL   Albumin 2.1 (L) 3.5 - 5.0 g/dL   AST 74 (H) 15 - 41 U/L   ALT 28 0 - 44 U/L   Alkaline Phosphatase 324 (H) 38 - 126 U/L   Total Bilirubin 1.0 <1.2 mg/dL   GFR, Estimated >09 >60 mL/min    Comment: (NOTE) Calculated using the CKD-EPI Creatinine Equation (2021)    Anion gap 17 (H) 5 - 15    Comment: Performed at [facility], [address], [place], [place] [address]  Ethanol     Status:  Abnormal   Collection Time: 07/28/23 11:35 AM  Result Value Ref Range   Alcohol, Ethyl (B) 180 (H) <10 mg/dL    Comment: (NOTE) Lowest detectable limit for serum alcohol is 10 mg/dL.  For medical purposes only. Performed at [facility], [address] [place], [place] [address]   CBC with Differential     Status: Abnormal   Collection Time: 07/28/23 11:35 AM  Result Value Ref Range   WBC 12.7 (H) 4.0 - 10.5 K/uL   RBC 4.18 (L) 4.22 - 5.81 MIL/uL   Hemoglobin 13.3 13.0 - 17.0 g/dL   HCT 47.8 (L) 29.5 - 62.1 %   MCV 92.1 80.0 - 100.0 fL   MCH 31.8 26.0 - 34.0 pg   MCHC 34.5 30.0 - 36.0 g/dL   RDW 30.8 65.7 - 84.6 %   Platelets 326 150 - 400 K/uL   nRBC 0.0 0.0 - 0.2 %   Neutrophils Relative % 79 %   Neutro Abs 10.1 (H) 1.7 - 7.7 K/uL   Lymphocytes Relative 9 %   Lymphs Abs 1.1 0.7 - 4.0 K/uL   Monocytes Relative 11 %   Monocytes Absolute 1.4 (H) 0.1 - 1.0 K/uL   Eosinophils Relative 0 %   Eosinophils Absolute 0.0 0.0 - 0.5 K/uL   Basophils Relative 0 %   Basophils Absolute 0.0 0.0 - 0.1 K/uL   Immature Granulocytes 1 %   Abs Immature Granulocytes 0.06 0.00 - 0.07 K/uL    Comment: Performed at [facility], [address], [place], [place] [address]  Magnesium     Status: None   Collection Time: 07/28/23  4:29 PM  Result Value Ref Range   Magnesium 1.8 1.7 - 2.4 mg/dL    Comment: Performed at [facility], [address], [place], [place] [address]  Ammonia     Status: Abnormal   Collection Time: 07/28/23  4:29 PM  Result Value Ref Range   Ammonia 41 (H) 9 - 35 umol/L    Comment: Performed at [facility], [address], [place], [place] [address]  Lipase, blood     Status: Abnormal   Collection Time: 07/28/23  4:29 PM  Result Value Ref Range   Lipase 182 (H) 11 - 51 U/L    Comment: Performed at [facility], [address], [place], [place] [address]  Brain natriuretic peptide     Status: None   Collection Time: 07/28/23  4:29 PM   Result Value Ref Range   B Natriuretic Peptide 23.4 0.0 - 100.0 pg/mL  Comment: Performed at [facility], [address] [place], [place] [address]  Protime-INR     Status: Abnormal   Collection Time: 07/28/23  4:29 PM  Result Value Ref Range   Prothrombin Time 15.4 (H) 11.4 - 15.2 seconds   INR 1.2 0.8 - 1.2    Comment: (NOTE) INR goal varies based on device and disease states. Performed at [facility], [address], [place], [place] [address]   I-Stat CG4 Lactic Acid     Status: Abnormal   Collection Time: 07/28/23  5:10 PM  Result Value Ref Range   Lactic Acid, Venous 2.3 (HH) 0.5 - 1.9 mmol/L   Comment NOTIFIED PHYSICIAN    CT ABDOMEN PELVIS W CONTRAST Result Date: 07/28/2023 CLINICAL DATA:  Acute abdominal pain with abdominal distension, initial encounter EXAM: CT ABDOMEN AND PELVIS WITH CONTRAST TECHNIQUE: Multidetector CT imaging of the abdomen and pelvis was performed using the standard protocol following bolus administration of intravenous contrast. RADIATION DOSE REDUCTION: This exam was performed according to the departmental dose-optimization program which includes automated exposure control, adjustment of the mA and/or kV according to patient size and/or use of iterative reconstruction technique. CONTRAST:  75mL OMNIPAQUE IOHEXOL 350 MG/ML SOLN COMPARISON:  None Available. FINDINGS: Lower chest: Lung bases are free of acute infiltrate or sizable effusion. Hepatobiliary: Fatty infiltration of the liver is noted. Mild hepatomegaly is seen. Gallbladder is within normal limits. Pancreas: Pancreas is well visualized. Very minimal peripancreatic inflammatory changes are noted likely representing some mild pancreatitis. Spleen: Normal in size without focal abnormality. Adrenals/Urinary Tract: Adrenal glands are within normal limits. Kidneys demonstrate a normal enhancement pattern bilaterally. No renal calculi or obstructive changes are seen. The bladder is partially  distended. Stomach/Bowel: No obstructive or inflammatory changes of the colon are seen. The appendix is within normal limits. Small bowel and stomach are unremarkable. Vascular/Lymphatic: Aortic atherosclerosis. No enlarged abdominal or pelvic lymph nodes. Reproductive: Prostate is unremarkable. Other: Significant ascites is noted with abdominal distension consistent with the given clinical history. Musculoskeletal: No acute bony abnormality is noted. Rounded fluid attenuation density is noted within the right buttock inferiorly measuring up to 4.6 cm. This likely represents a sebaceous cyst. IMPRESSION: Changes of mild pancreatitis. Significant ascites is identified. Fatty infiltration with hepatomegaly. Electronically Signed   By: Alcide Clever M.D.   On: 07/28/2023 17:30    Pending Labs Unresulted Labs (From admission, onward)     Start     Ordered   07/28/23 2200  Lipase, blood  Once,   R        07/28/23 1847   07/28/23 2200  Comprehensive metabolic panel  Once,   R        07/28/23 1848   07/28/23 1525  Blood culture (routine x 2)  BLOOD CULTURE X 2,   R (with STAT occurrences)      07/28/23 1524   07/28/23 1121  Urinalysis, Routine w reflex microscopic -Urine, Random  Once,   URGENT       Question:  Specimen Source  Answer:  Urine, Random   07/28/23 1121            Vitals/Pain Today's Vitals   07/28/23 1715 07/28/23 1807 07/28/23 1815 07/28/23 1816  BP: (!) 151/103 (!) 131/91 (!) 152/90   Pulse:  (!) 119 (!) 123   Resp: 18 20 19    Temp:    98.5 F (36.9 C)  SpO2:  98% 96%   Weight:  Height:      PainSc:        Isolation Precautions No active isolations  Medications Medications  LORazepam (ATIVAN) tablet 1-4 mg (has no administration in time range)    Or  LORazepam (ATIVAN) injection 1-4 mg (has no administration in time range)  thiamine (VITAMIN B1) tablet 100 mg (100 mg Oral Given 07/28/23 1641)    Or  thiamine (VITAMIN B1) injection 100 mg ( Intravenous See  Alternative 07/28/23 1641)  folic acid (FOLVITE) tablet 1 mg (1 mg Oral Given 07/28/23 1641)  multivitamin with minerals tablet 1 tablet (1 tablet Oral Given 07/28/23 1641)  potassium chloride 10 mEq in 100 mL IVPB (10 mEq Intravenous New Bag/Given 07/28/23 1826)  potassium chloride SA (KLOR-CON M) CR tablet 40 mEq (40 mEq Oral Given 07/28/23 1641)  iohexol (OMNIPAQUE) 350 MG/ML injection 75 mL (75 mLs Intravenous Contrast Given 07/28/23 1654)    Mobility walks     Focused Assessments Cardiac Assessment Handoff:  Cardiac Rhythm: Sinus tachycardia No results found for: "CKTOTAL", "CKMB", "CKMBINDEX", "TROPONINI" No results found for: "DDIMER" Does the Patient currently have chest pain? Yes    R Recommendations: See Admitting Provider Note  Report given to:   Additional Notes:

## 2023-07-28 NOTE — ED Provider Notes (Cosign Needed)
Physical Exam  BP (!) 151/103   Pulse (!) 133   Temp 98.5 F (36.9 C)   Resp 18   Ht 6\' 1"  (1.854 m)   Wt 90.7 kg   SpO2 97%   BMI 26.39 kg/m   Physical Exam Vitals and nursing note reviewed.  Constitutional:      General: He is not in acute distress.    Appearance: He is not toxic-appearing.  Cardiovascular:     Comments: Pitting edema extending up into the mid calf region.  Palpable pulses. Pulmonary:     Effort: Pulmonary effort is normal. No respiratory distress.  Abdominal:     General: There is distension.     Tenderness: There is no abdominal tenderness.     Comments: Significant abdominal distention however pliable.  Nontender to palpation.  No overlying skin changes noted.  Musculoskeletal:     Right lower leg: 2+ Edema present.     Left lower leg: 2+ Edema present.  Skin:    General: Skin is warm and dry.  Neurological:     Mental Status: He is alert.     Comments: No asterixis appreciated.  Patient oriented, appropriate. No tremors noted.      Procedures  Procedures  ED Course / MDM   Clinical Course as of 07/28/23 1841  Wynelle Link Jul 28, 2023  1832 Consult to IR placed.   [RR]    Clinical Course User Index [RR] Achille Rich, PA-C   Medical Decision Making Amount and/or Complexity of Data Reviewed Labs: ordered. Radiology: ordered.  Risk OTC drugs. Prescription drug management. Decision regarding hospitalization.   Accepted handoff at shift change from [facility]. Please see prior provider note for more detail.   Briefly: Patient is 42 y.o. M presenting with abdominal tension and lower leg edema for the past 3 weeks.  Patient is alcohol user with at least 1/5 of liquor daily.  Last drink was at 0400.  On my interview, patient reports he did have some nausea and vomiting 2 days ago but has not had any since.  Reports that his bowels are always loose but no change to them.  Not black or bloody.  DDX: concern for ascites  Plan: Follow-up CT  scan  Patient's presentation, about abdominal pneumonia, lactic given the patient's tachycardia as well as blood cultures, I have also added a CIWA protocol given the patient's last drink was at 0400 and the patient is having some tachycardia.  Not having any tremors but does have some anxiety.  I independently reviewed and interpreted the patient's labs.  BMP and magnesium within normal limits.  CMP shows hyponatremia 130 with potassium of 2.8.  Chloride at 86.  Glucose at 169 with a BUN of less than 5.  Creatinine is 0.69.  Patient has decreased calcium and albumin.  AST mildly elevated at 74 with a normal ALT.  Alk phos elevated at 324.  Normal total bilirubin.  Anion gap of 17.  Patient may have some alcoholic ketoacidosis however again given his ascites, hesitant on giving any fluids at this time.  CBC does show leukocytosis at 12.7.  Normal hemoglobin with a slightly decreased Mehta crit of 38.5.  INR within normal limits.  Ammonia slightly elevated at 41.  Alcohol at 180 with last drink being 0400 today.  Lipase is elevated at 182.  Lactic elevated at 2.3.  Blood cultures pending.  Urinalysis still needs to be collected.   CT abdomen pelvis shows changes of mild pancreatitis. Significant  ascites is identified. Fatty infiltration with hepatomegaly. Per radiologist's interpretation.    I consulted GI and spoke with Dr. Meridee Score.  Discussed with him about starting medications for presumed SBP or not given the patient has abdominal distention with a white count and some tachycardia with a mildly elevated lactic but does not have any abdominal tenderness to palpation.  He reports he would like to hold off on antibiotics until clinical picture is change until diagnostic paracentesis can be done.  If any condition changes, antibiotics can be given.  He would also like to hold off on any Lasix given the patient's hypokalemia.  He does recommend a diagnostic paracentesis.  He would like at most 4 L taken  off.  Would like 25 g of albumin given after paracentesis.  Would also recommend trending the lipase.  He would like a fluid cell count, culture, total protein, albumin, LDH, and fluid lipase and amylase.  Also recommends a right upper quadrant ultrasound as well.  Wants repeat labs at 2200 to determine other electrolyte replenishment. His team will see the patient in the morning.  I have ordered the right upper quadrant ultrasound as well as the ultrasound paracentesis to be done with the labs.  Spoke with hospitalist Dr. Austin Miles regarding this patient and GI recommendations.   Results for orders placed or performed during the hospital encounter of 07/28/23  Comprehensive metabolic panel   Collection Time: 07/28/23 11:35 AM  Result Value Ref Range   Sodium 130 (L) 135 - 145 mmol/L   Potassium 2.8 (L) 3.5 - 5.1 mmol/L   Chloride 86 (L) 98 - 111 mmol/L   CO2 27 22 - 32 mmol/L   Glucose, Bld 169 (H) 70 - 99 mg/dL   BUN <5 (L) 6 - 20 mg/dL   Creatinine, Ser 5.78 0.61 - 1.24 mg/dL   Calcium 7.7 (L) 8.9 - 10.3 mg/dL   Total Protein 6.5 6.5 - 8.1 g/dL   Albumin 2.1 (L) 3.5 - 5.0 g/dL   AST 74 (H) 15 - 41 U/L   ALT 28 0 - 44 U/L   Alkaline Phosphatase 324 (H) 38 - 126 U/L   Total Bilirubin 1.0 <1.2 mg/dL   GFR, Estimated >46 >96 mL/min   Anion gap 17 (H) 5 - 15  Ethanol   Collection Time: 07/28/23 11:35 AM  Result Value Ref Range   Alcohol, Ethyl (B) 180 (H) <10 mg/dL  CBC with Differential   Collection Time: 07/28/23 11:35 AM  Result Value Ref Range   WBC 12.7 (H) 4.0 - 10.5 K/uL   RBC 4.18 (L) 4.22 - 5.81 MIL/uL   Hemoglobin 13.3 13.0 - 17.0 g/dL   HCT 29.5 (L) 28.4 - 13.2 %   MCV 92.1 80.0 - 100.0 fL   MCH 31.8 26.0 - 34.0 pg   MCHC 34.5 30.0 - 36.0 g/dL   RDW 44.0 10.2 - 72.5 %   Platelets 326 150 - 400 K/uL   nRBC 0.0 0.0 - 0.2 %   Neutrophils Relative % 79 %   Neutro Abs 10.1 (H) 1.7 - 7.7 K/uL   Lymphocytes Relative 9 %   Lymphs Abs 1.1 0.7 - 4.0 K/uL   Monocytes Relative  11 %   Monocytes Absolute 1.4 (H) 0.1 - 1.0 K/uL   Eosinophils Relative 0 %   Eosinophils Absolute 0.0 0.0 - 0.5 K/uL   Basophils Relative 0 %   Basophils Absolute 0.0 0.0 - 0.1 K/uL   Immature Granulocytes 1 %  Abs Immature Granulocytes 0.06 0.00 - 0.07 K/uL  Magnesium   Collection Time: 07/28/23  4:29 PM  Result Value Ref Range   Magnesium 1.8 1.7 - 2.4 mg/dL  Ammonia   Collection Time: 07/28/23  4:29 PM  Result Value Ref Range   Ammonia 41 (H) 9 - 35 umol/L  Lipase, blood   Collection Time: 07/28/23  4:29 PM  Result Value Ref Range   Lipase 182 (H) 11 - 51 U/L  Brain natriuretic peptide   Collection Time: 07/28/23  4:29 PM  Result Value Ref Range   B Natriuretic Peptide 23.4 0.0 - 100.0 pg/mL  Protime-INR   Collection Time: 07/28/23  4:29 PM  Result Value Ref Range   Prothrombin Time 15.4 (H) 11.4 - 15.2 seconds   INR 1.2 0.8 - 1.2  I-Stat CG4 Lactic Acid   Collection Time: 07/28/23  5:10 PM  Result Value Ref Range   Lactic Acid, Venous 2.3 (HH) 0.5 - 1.9 mmol/L   Comment NOTIFIED PHYSICIAN    CT ABDOMEN PELVIS W CONTRAST Result Date: 07/28/2023 CLINICAL DATA:  Acute abdominal pain with abdominal distension, initial encounter EXAM: CT ABDOMEN AND PELVIS WITH CONTRAST TECHNIQUE: Multidetector CT imaging of the abdomen and pelvis was performed using the standard protocol following bolus administration of intravenous contrast. RADIATION DOSE REDUCTION: This exam was performed according to the departmental dose-optimization program which includes automated exposure control, adjustment of the mA and/or kV according to patient size and/or use of iterative reconstruction technique. CONTRAST:  75mL OMNIPAQUE IOHEXOL 350 MG/ML SOLN COMPARISON:  None Available. FINDINGS: Lower chest: Lung bases are free of acute infiltrate or sizable effusion. Hepatobiliary: Fatty infiltration of the liver is noted. Mild hepatomegaly is seen. Gallbladder is within normal limits. Pancreas: Pancreas is  well visualized. Very minimal peripancreatic inflammatory changes are noted likely representing some mild pancreatitis. Spleen: Normal in size without focal abnormality. Adrenals/Urinary Tract: Adrenal glands are within normal limits. Kidneys demonstrate a normal enhancement pattern bilaterally. No renal calculi or obstructive changes are seen. The bladder is partially distended. Stomach/Bowel: No obstructive or inflammatory changes of the colon are seen. The appendix is within normal limits. Small bowel and stomach are unremarkable. Vascular/Lymphatic: Aortic atherosclerosis. No enlarged abdominal or pelvic lymph nodes. Reproductive: Prostate is unremarkable. Other: Significant ascites is noted with abdominal distension consistent with the given clinical history. Musculoskeletal: No acute bony abnormality is noted. Rounded fluid attenuation density is noted within the right buttock inferiorly measuring up to 4.6 cm. This likely represents a sebaceous cyst. IMPRESSION: Changes of mild pancreatitis. Significant ascites is identified. Fatty infiltration with hepatomegaly. Electronically Signed   By: Alcide Clever M.D.   On: 07/28/2023 17:30        Achille Rich, PA-C 07/28/23 1610

## 2023-07-28 NOTE — ED Triage Notes (Signed)
Pt. Ststed, Im not feeling good. Im an alcoholic and Ive had bloating in my stomach for 2 weeks and swelling in both legs .

## 2023-07-28 NOTE — H&P (Signed)
History and Physical    Patient: Juan Crawford:[id number] DOB: 04-Oct-1980 DOA: 07/28/2023 DOS: the patient was seen and examined on 07/28/2023 PCP: Bryon Lions, PA-C  Patient coming from: Home  Chief Complaint:  Chief Complaint  Patient presents with   Bloated   Leg Swelling   Abdominal Pain   Alcohol Problem   HPI: Juan Crawford is a 42 y.o. male with medical history significant of alcohol use disorder and hypertension presenting with abdominal and leg swelling for the past 3 weeks.  He reports that he first noticed abdominal swelling about 3 weeks ago.  His abdominal swelling progressively became worse over the past couple of weeks and he began developing bilateral leg swelling as well.  He came to the emergency department as his abdominal and leg swelling was not going away on its own.  He does report history of alcohol use, drinks about 1/5 of liquor daily.  His last drink was around 4 AM this morning.  He denies any nausea, vomiting, yellowing of his skin, chest pain, palpitations, shortness of breath, abdominal pain, diarrhea, constipation, urinary changes.  He denies any history of delirium tremens.  ED course: Initial vital signs with tachycardia and elevated BP, but he is afebrile and saturating well on room air.  CBC showing leukocytosis but otherwise with stable hemoglobin and platelet count.  CMP showing hyponatremia, hypokalemia, elevated AST and alk phos, hypoalbuminemia, normal kidney function.  PT mildly elevated with normal INR.  Alcohol level elevated.  Lipase elevated at 182.  Lactate 2.1.  Ammonia 41.  BNP normal.  CT abdomen pelvis showing mild pancreatitis, fatty infiltration with hepatomegaly, and significant ascites.  Right upper quadrant ultrasound showing fatty liver and moderate ascites.  ED provider has reached out to IR who will perform paracentesis tomorrow.  ED provider also consulted GI, Dr. Meridee Score, who will evaluate patient tomorrow.  [facility]  hospitalist asked to evaluate patient for admission.   Review of Systems: As mentioned in the history of present illness. All other systems reviewed and are negative. Past Medical History:  Diagnosis Date   Hypertension    Nicotine dependence    Past Surgical History:  Procedure Laterality Date   CYST EXCISION Left 02/12/2019   Procedure: DEBRIDEMENT NECK WOUND AND PLACEMENT OF DRAIN;  Surgeon: Christia Reading, MD;  Location: [facility];  Service: ENT;  Laterality: Left;   FACIAL LACERATION REPAIR N/A 12/21/2018   Procedure: Facial Laceration Repair;  Surgeon: Christia Reading, MD;  Location: [facility] OR;  Service: ENT;  Laterality: N/A;  3cm wound on chin   HAND SURGERY  2001   MANDIBULAR HARDWARE REMOVAL Bilateral 02/12/2019   Procedure: MANDIBULAR HARDWARE REMOVAL;  Surgeon: Christia Reading, MD;  Location: [facility];  Service: ENT;  Laterality: Bilateral;   ORIF MANDIBULAR FRACTURE N/A 12/21/2018   Procedure: MAXILLO-MANDIBULAR FIXATION AND ORIF OF MANDIBLE FRACTURE;  Surgeon: Christia Reading, MD;  Location: [facility] OR;  Service: ENT;  Laterality: N/A;   Social History:  reports that he has been smoking e-cigarettes and cigarettes. He has a 20 pack-year smoking history. He has never used smokeless tobacco. He reports current alcohol use of about 6.0 standard drinks of alcohol per week. He reports current drug use. Frequency: 3.00 times per week. Drug: Marijuana.  No Known Allergies  Family History  Problem Relation Age of Onset   Diabetes Neg Hx     Prior to Admission medications   Medication Sig Start Date End Date Taking? Authorizing  Provider  amLODipine (NORVASC) 10 MG tablet Take 10 mg by mouth daily. 08/28/19   [provider]  benazepril (LOTENSIN) 10 MG tablet Take by mouth. 07/20/19   [provider]  chlordiazePOXIDE (LIBRIUM) 25 MG capsule 50mg  PO TID x 1D, then 25-50mg  PO BID X 1D, then 25-50mg  PO QD X 1D 06/06/22   Melene Plan, DO  chlorhexidine  (PERIDEX) 0.12 % solution Use as directed 15 mLs in the mouth or throat 2 (two) times daily. 01/11/19   Claiborne Rigg, NP  feeding supplement, ENSURE ENLIVE, (ENSURE ENLIVE) LIQD Take 237 mLs by mouth 3 (three) times daily between meals. 12/24/18   Vassie Loll, MD  ibuprofen (ADVIL) 100 MG/5ML suspension Take 20 mLs (400 mg total) by mouth every 8 (eight) hours as needed for fever or moderate pain. 01/07/19   Claiborne Rigg, NP  metoprolol tartrate (LOPRESSOR) 50 MG tablet Take 50 mg by mouth 2 (two) times daily. 09/07/19   [provider]  Multiple Vitamin (MULTIVITAMIN) LIQD Take 15 mLs by mouth daily. 12/25/18   Vassie Loll, MD  nicotine (NICODERM CQ) 14 mg/24hr patch Place 14 mg onto the skin daily.    [provider]    Physical Exam: Vitals:   07/28/23 1715 07/28/23 1807 07/28/23 1815 07/28/23 1816  BP: (!) 151/103 (!) 131/91 (!) 152/90   Pulse:  (!) 119 (!) 123   Resp: 18 20 19    Temp:    98.5 F (36.9 C)  SpO2:  98% 96%   Weight:      Height:       Physical Exam Constitutional:      Appearance: He is well-developed.     Comments: Middle aged male, laying in bed, NAD.  HENT:     Head: Normocephalic and atraumatic.     Mouth/Throat:     Mouth: Mucous membranes are dry.     Pharynx: Oropharynx is clear. No oropharyngeal exudate.     Comments: No sublingual jaundice noted. Poor dentition. Eyes:     Extraocular Movements: Extraocular movements intact.     Pupils: Pupils are equal, round, and reactive to light.     Comments: No scleral icterus noted.  Cardiovascular:     Rate and Rhythm: Regular rhythm. Tachycardia present.     Pulses: Normal pulses.     Heart sounds: Normal heart sounds. No murmur heard.    No friction rub. No gallop.  Pulmonary:     Effort: Pulmonary effort is normal.     Breath sounds: Normal breath sounds. No wheezing, rhonchi or rales.  Abdominal:     General: Abdomen is protuberant. Bowel sounds are decreased. There is  distension.     Palpations: There is fluid wave and hepatomegaly.     Tenderness: There is no abdominal tenderness. There is no guarding or rebound.     Comments: Ascites present. Dilated abdominal veins noted.   Musculoskeletal:     Cervical back: Normal range of motion.     Comments: 1+ pitting edema up to mid-shins bilaterally.  Skin:    General: Skin is warm and dry.     Coloration: Skin is not jaundiced.  Neurological:     General: No focal deficit present.     Mental Status: He is alert and oriented to person, place, and time.     Comments: No asterixis noted.  Psychiatric:        Mood and Affect: Mood normal.        Behavior:  Behavior normal.     Data Reviewed:  There are no new results to review at this time.  US Abdomen Limited RUQ (LIVER/GB) Result Date: 07/28/2023 CLINICAL DATA:  Elevated lipase EXAM: ULTRASOUND ABDOMEN LIMITED RIGHT UPPER QUADRANT COMPARISON:  None Available. FINDINGS: Gallbladder: No gallstones or wall thickening visualized. No sonographic Murphy sign noted by sonographer. Common bile duct: Diameter: Not well visualized Liver: Increase in echogenicity likely related to fatty infiltration. Portal vein is patent on color Doppler imaging with normal direction of blood flow towards the liver. Other: Moderate ascites is identified. IMPRESSION: Fatty liver. Moderate ascites. Electronically Signed   By: Alcide Clever M.D.   On: 07/28/2023 19:07   CT ABDOMEN PELVIS W CONTRAST Result Date: 07/28/2023 CLINICAL DATA:  Acute abdominal pain with abdominal distension, initial encounter EXAM: CT ABDOMEN AND PELVIS WITH CONTRAST TECHNIQUE: Multidetector CT imaging of the abdomen and pelvis was performed using the standard protocol following bolus administration of intravenous contrast. RADIATION DOSE REDUCTION: This exam was performed according to the departmental dose-optimization program which includes automated exposure control, adjustment of the mA and/or kV according  to patient size and/or use of iterative reconstruction technique. CONTRAST:  75mL OMNIPAQUE IOHEXOL 350 MG/ML SOLN COMPARISON:  None Available. FINDINGS: Lower chest: Lung bases are free of acute infiltrate or sizable effusion. Hepatobiliary: Fatty infiltration of the liver is noted. Mild hepatomegaly is seen. Gallbladder is within normal limits. Pancreas: Pancreas is well visualized. Very minimal peripancreatic inflammatory changes are noted likely representing some mild pancreatitis. Spleen: Normal in size without focal abnormality. Adrenals/Urinary Tract: Adrenal glands are within normal limits. Kidneys demonstrate a normal enhancement pattern bilaterally. No renal calculi or obstructive changes are seen. The bladder is partially distended. Stomach/Bowel: No obstructive or inflammatory changes of the colon are seen. The appendix is within normal limits. Small bowel and stomach are unremarkable. Vascular/Lymphatic: Aortic atherosclerosis. No enlarged abdominal or pelvic lymph nodes. Reproductive: Prostate is unremarkable. Other: Significant ascites is noted with abdominal distension consistent with the given clinical history. Musculoskeletal: No acute bony abnormality is noted. Rounded fluid attenuation density is noted within the right buttock inferiorly measuring up to 4.6 cm. This likely represents a sebaceous cyst. IMPRESSION: Changes of mild pancreatitis. Significant ascites is identified. Fatty infiltration with hepatomegaly. Electronically Signed   By: Alcide Clever M.D.   On: 07/28/2023 17:30     Assessment and Plan: No notes have been filed under this hospital service. Service: Hospitalist  New-onset Ascites Elevated liver enzymes Alcohol use disorder Alcoholic fatty liver Patient presenting with new onset ascites that is progressively worsened over the past 3 weeks. Labs showing elevated AST, elevated alk phos, hypoalbuminemia, elevated PT. He does have a normal bilirubin and normal PLT count.   CT imaging and ultrasound showing fatty liver, likely alcoholic given history, and moderate ascites.  Given this is in the setting of long-term alcohol use, suspicion for liver etiology.  CT did also revealed mild pancreatitis, though would be unusual for this to be the cause given swelling has been present for at least 3 weeks.  Despite a mildly elevated lactate and mild leukocytosis, patient does not have any abdominal pain and remains afebrile.  ED provider discussed with Dr. Meridee Score, GI, who recommended holding off on antibiotics until diagnostic paracentesis unless patient develops abdominal pain or becomes febrile.  Also recommended to hold off on Lasix given hypokalemia.  GI recommended removing about 4 L of fluid via IR paracentesis and providing 25 g of albumin after paracentesis. -GI consulted,  appreciate assistance -IR guided paracentesis tomorrow, appreciate IR assistance -Will likely need to provide albumin after paracentesis -Follow-up fluid cell count, culture, total protein, albumin, LDH, lipase, amylase obtained from paracentesis -Will avoid any additional IV fluids given significant ascites -Follow-up blood cultures x 2, f/u repeat lactate -Lidocaine patch for pain related to amount of abdominal swelling  -SCDs for DVT prophylaxis, holding Lovenox in anticipation for paracentesis tomorrow -Initiate empiric antibiotics should patient develop abdominal pain or becomes febrile -Holding off on Lasix given hypokalemia  Alcohol use disorder Last drink at 4 AM today.  No history of delirium tremens.  Placed patient on CIWA with as needed Ativan in anticipation for possible withdrawals.  Patient is interested in alcohol cessation.  Discussed providing substance use resources at discharge. -CIWA with as needed Ativan -Thiamine, folate, multivitamin -TOC consult for alcohol cessation  Mild pancreatitis Noted on CT imaging.  He denies any epigastric pain.  Mild pancreatitis likely  associated with underlying alcohol use.  Right upper quadrant ultrasound did not reveal any gallstones.  He does have an appetite, discussed initiating clear liquid diet for early nutrition.  He is afebrile. -trend lipase and CMP -Clear liquid diet, can advance as tolerated tomorrow -Avoiding IV fluid rehydration given significant ascites -Alcohol cessation as noted above -He appears stable at this time, will avoid initiating antibiotics unless if clinical situation changes  Hypokalemia K 2.8 on arrival. Mg 1.8. Given PO and IV potassium supplementation for repletion.  -repeat CMP tonight  HTN Sinus tachycardia Blood pressure elevated in ED (140s-150s/100s). He remains tachycardic to 120s. EKG showing sinus tachycardia. Likely 2/2 intravascular volume depletion and third-spacing, also from mild pancreatitis.  -resume home norvasc -holding lopressor until after paracentesis given significant ascites -holding home benazepril given ascites   Advance Care Planning:   Code Status: Full Code   Consults: GI, IR  Family Communication: updated family at bedside  Severity of Illness: The appropriate patient status for this patient is OBSERVATION. Observation status is judged to be reasonable and necessary in order to provide the required intensity of service to ensure the patient's safety. The patient's presenting symptoms, physical exam findings, and initial radiographic and laboratory data in the context of their medical condition is felt to place them at decreased risk for further clinical deterioration. Furthermore, it is anticipated that the patient will be medically stable for discharge from the hospital within 2 midnights of admission.   Author: Briscoe Burns, MD 07/28/2023 7:15 PM  For on call review [email or web address].

## 2023-07-29 ENCOUNTER — Encounter (HOSPITAL_COMMUNITY): Payer: Self-pay | Admitting: Student

## 2023-07-29 ENCOUNTER — Observation Stay (HOSPITAL_COMMUNITY): Payer: 59

## 2023-07-29 DIAGNOSIS — R160 Hepatomegaly, not elsewhere classified: Secondary | ICD-10-CM

## 2023-07-29 DIAGNOSIS — K700 Alcoholic fatty liver: Secondary | ICD-10-CM | POA: Diagnosis present

## 2023-07-29 DIAGNOSIS — K852 Alcohol induced acute pancreatitis without necrosis or infection: Secondary | ICD-10-CM | POA: Diagnosis present

## 2023-07-29 DIAGNOSIS — F1010 Alcohol abuse, uncomplicated: Secondary | ICD-10-CM | POA: Diagnosis present

## 2023-07-29 DIAGNOSIS — D509 Iron deficiency anemia, unspecified: Secondary | ICD-10-CM | POA: Diagnosis present

## 2023-07-29 DIAGNOSIS — E8809 Other disorders of plasma-protein metabolism, not elsewhere classified: Secondary | ICD-10-CM | POA: Diagnosis present

## 2023-07-29 DIAGNOSIS — K760 Fatty (change of) liver, not elsewhere classified: Secondary | ICD-10-CM

## 2023-07-29 DIAGNOSIS — K7031 Alcoholic cirrhosis of liver with ascites: Secondary | ICD-10-CM

## 2023-07-29 DIAGNOSIS — E872 Acidosis, unspecified: Secondary | ICD-10-CM | POA: Diagnosis present

## 2023-07-29 DIAGNOSIS — E869 Volume depletion, unspecified: Secondary | ICD-10-CM | POA: Diagnosis present

## 2023-07-29 DIAGNOSIS — E871 Hypo-osmolality and hyponatremia: Secondary | ICD-10-CM | POA: Diagnosis present

## 2023-07-29 DIAGNOSIS — J189 Pneumonia, unspecified organism: Secondary | ICD-10-CM | POA: Diagnosis present

## 2023-07-29 DIAGNOSIS — E876 Hypokalemia: Secondary | ICD-10-CM | POA: Diagnosis present

## 2023-07-29 DIAGNOSIS — E440 Moderate protein-calorie malnutrition: Secondary | ICD-10-CM | POA: Diagnosis not present

## 2023-07-29 DIAGNOSIS — K863 Pseudocyst of pancreas: Secondary | ICD-10-CM | POA: Diagnosis present

## 2023-07-29 DIAGNOSIS — R1013 Epigastric pain: Secondary | ICD-10-CM

## 2023-07-29 DIAGNOSIS — K7011 Alcoholic hepatitis with ascites: Secondary | ICD-10-CM | POA: Diagnosis present

## 2023-07-29 DIAGNOSIS — K859 Acute pancreatitis without necrosis or infection, unspecified: Secondary | ICD-10-CM | POA: Diagnosis not present

## 2023-07-29 DIAGNOSIS — F17210 Nicotine dependence, cigarettes, uncomplicated: Secondary | ICD-10-CM | POA: Diagnosis present

## 2023-07-29 DIAGNOSIS — R600 Localized edema: Secondary | ICD-10-CM | POA: Diagnosis present

## 2023-07-29 DIAGNOSIS — Z6826 Body mass index (BMI) 26.0-26.9, adult: Secondary | ICD-10-CM | POA: Diagnosis not present

## 2023-07-29 DIAGNOSIS — R791 Abnormal coagulation profile: Secondary | ICD-10-CM | POA: Diagnosis present

## 2023-07-29 DIAGNOSIS — I10 Essential (primary) hypertension: Secondary | ICD-10-CM

## 2023-07-29 DIAGNOSIS — M7989 Other specified soft tissue disorders: Secondary | ICD-10-CM | POA: Diagnosis present

## 2023-07-29 DIAGNOSIS — R112 Nausea with vomiting, unspecified: Secondary | ICD-10-CM

## 2023-07-29 DIAGNOSIS — R188 Other ascites: Secondary | ICD-10-CM | POA: Diagnosis present

## 2023-07-29 DIAGNOSIS — F17290 Nicotine dependence, other tobacco product, uncomplicated: Secondary | ICD-10-CM | POA: Diagnosis present

## 2023-07-29 DIAGNOSIS — E43 Unspecified severe protein-calorie malnutrition: Secondary | ICD-10-CM | POA: Diagnosis present

## 2023-07-29 DIAGNOSIS — F419 Anxiety disorder, unspecified: Secondary | ICD-10-CM | POA: Diagnosis present

## 2023-07-29 DIAGNOSIS — F1090 Alcohol use, unspecified, uncomplicated: Secondary | ICD-10-CM | POA: Diagnosis not present

## 2023-07-29 DIAGNOSIS — Z79899 Other long term (current) drug therapy: Secondary | ICD-10-CM | POA: Diagnosis not present

## 2023-07-29 HISTORY — PX: IR PARACENTESIS: IMG2679

## 2023-07-29 LAB — COMPREHENSIVE METABOLIC PANEL
ALT: 24 U/L (ref 0–44)
AST: 54 U/L — ABNORMAL HIGH (ref 15–41)
Albumin: 1.9 g/dL — ABNORMAL LOW (ref 3.5–5.0)
Alkaline Phosphatase: 326 U/L — ABNORMAL HIGH (ref 38–126)
Anion gap: 14 (ref 5–15)
BUN: 6 mg/dL (ref 6–20)
CO2: 27 mmol/L (ref 22–32)
Calcium: 7.5 mg/dL — ABNORMAL LOW (ref 8.9–10.3)
Chloride: 88 mmol/L — ABNORMAL LOW (ref 98–111)
Creatinine, Ser: 0.7 mg/dL (ref 0.61–1.24)
GFR, Estimated: >60 mL/min (ref >60)
Glucose, Bld: 169 mg/dL — ABNORMAL HIGH (ref 70–99)
Potassium: 3.3 mmol/L — ABNORMAL LOW (ref 3.5–5.1)
Sodium: 129 mmol/L — ABNORMAL LOW (ref 135–145)
Total Bilirubin: 1 mg/dL (ref <1.2)
Total Protein: 5.5 g/dL — ABNORMAL LOW (ref 6.5–8.1)

## 2023-07-29 LAB — CBC
HCT: 34.1 % — ABNORMAL LOW (ref 39.0–52.0)
Hemoglobin: 11.7 g/dL — ABNORMAL LOW (ref 13.0–17.0)
MCH: 31 pg (ref 26.0–34.0)
MCHC: 34.3 g/dL (ref 30.0–36.0)
MCV: 90.2 fL (ref 80.0–100.0)
Platelets: 273 10*3/uL (ref 150–400)
RBC: 3.78 MIL/uL — ABNORMAL LOW (ref 4.22–5.81)
RDW: 14.5 % (ref 11.5–15.5)
WBC: 12.8 10*3/uL — ABNORMAL HIGH (ref 4.0–10.5)
nRBC: 0 % (ref 0.0–0.2)

## 2023-07-29 LAB — HEPATITIS PANEL, ACUTE
HCV Ab: NONREACTIVE
Hep A IgM: NONREACTIVE
Hep B C IgM: NONREACTIVE
Hepatitis B Surface Ag: NONREACTIVE

## 2023-07-29 LAB — GRAM STAIN

## 2023-07-29 LAB — BODY FLUID CELL COUNT WITH DIFFERENTIAL
Eos, Fluid: 0 %
Lymphs, Fluid: 4 %
Monocyte-Macrophage-Serous Fluid: 78 % (ref 50–90)
Neutrophil Count, Fluid: 18 % (ref 0–25)
Total Nucleated Cell Count, Fluid: 222 uL (ref 0–1000)

## 2023-07-29 LAB — PROTEIN, PLEURAL OR PERITONEAL FLUID: Total protein, fluid: <3 g/dL

## 2023-07-29 LAB — ALBUMIN, PLEURAL OR PERITONEAL FLUID: Albumin, Fluid: <1.5 g/dL

## 2023-07-29 LAB — IRON: Iron: 26 ug/dL — ABNORMAL LOW (ref 45–182)

## 2023-07-29 LAB — RAPID URINE DRUG SCREEN, HOSP PERFORMED
Amphetamines: NOT DETECTED
Barbiturates: NOT DETECTED
Benzodiazepines: NOT DETECTED
Cocaine: NOT DETECTED
Opiates: NOT DETECTED
Tetrahydrocannabinol: POSITIVE — AB

## 2023-07-29 LAB — LACTATE DEHYDROGENASE, PLEURAL OR PERITONEAL FLUID: LD, Fluid: 308 U/L — ABNORMAL HIGH (ref 3–23)

## 2023-07-29 LAB — APTT: aPTT: 32 s (ref 24–36)

## 2023-07-29 LAB — PROTIME-INR
INR: 1.3 — ABNORMAL HIGH (ref 0.8–1.2)
Prothrombin Time: 16.2 s — ABNORMAL HIGH (ref 11.4–15.2)

## 2023-07-29 LAB — TSH: TSH: 1.681 u[IU]/mL (ref 0.350–4.500)

## 2023-07-29 LAB — AMYLASE, PLEURAL OR PERITONEAL FLUID: Amylase, Fluid: 3367 U/L

## 2023-07-29 LAB — HEPATITIS B CORE ANTIBODY, TOTAL: Hep B Core Total Ab: NONREACTIVE

## 2023-07-29 LAB — FERRITIN: Ferritin: 1766 ng/mL — ABNORMAL HIGH (ref 24–336)

## 2023-07-29 MED ORDER — ATENOLOL 25 MG PO TABS
12.5000 mg | ORAL_TABLET | Freq: Two times a day (BID) | ORAL | Status: DC
  Administered 2023-07-29 (×2): 12.5 mg via ORAL
  Filled 2023-07-29 (×2): qty 1

## 2023-07-29 MED ORDER — LIDOCAINE HCL 1 % IJ SOLN
INTRAMUSCULAR | Status: AC
  Filled 2023-07-29: qty 20

## 2023-07-29 MED ORDER — POTASSIUM CHLORIDE CRYS ER 20 MEQ PO TBCR
40.0000 meq | EXTENDED_RELEASE_TABLET | ORAL | Status: AC
  Administered 2023-07-29 (×2): 40 meq via ORAL
  Filled 2023-07-29 (×2): qty 2

## 2023-07-29 NOTE — Plan of Care (Signed)
  Problem: Clinical Measurements: Goal: Ability to maintain clinical measurements within normal limits will improve Outcome: Progressing Goal: Will remain free from infection Outcome: Progressing   Problem: Activity: Goal: Risk for activity intolerance will decrease Outcome: Progressing   Problem: Nutrition: Goal: Adequate nutrition will be maintained Outcome: Progressing   Problem: Pain Management: Goal: General experience of comfort will improve Outcome: Progressing   Problem: Safety: Goal: Ability to remain free from injury will improve Outcome: Progressing   Problem: Skin Integrity: Goal: Risk for impaired skin integrity will decrease Outcome: Progressing

## 2023-07-29 NOTE — Assessment & Plan Note (Addendum)
07-29-2023 continue with CIWA protocol. Pt admits to drinking 1/5 liquor per day. 07-30-2023 2 prn ativans given in the last 24 hours.

## 2023-07-29 NOTE — Assessment & Plan Note (Addendum)
07-29-2023 s/p 4 liter paracentesis today. Ascites fluid does not meet criteria for SBP.  No abx are indicated at this time. 07-30-2023 GI continues workup for his ascites. May need to re-image in a few days to see if he needs another paracentesis. Discussed with pt that paracentesis would be repeated only if he develops difficulty with breathing or early satiety.

## 2023-07-29 NOTE — Assessment & Plan Note (Addendum)
07-29-2023 replete with po kcl 07-30-2023 resolved. K of 3.6

## 2023-07-29 NOTE — Progress Notes (Signed)
   07/28/23 2034  Assess: MEWS Score  Temp 98.9 F (37.2 C)  BP (!) 152/92  MAP (mmHg) 109  Pulse Rate (!) 133  Resp 16  SpO2 94 %  O2 Device Room Air  Assess: MEWS Score  MEWS Temp 0  MEWS Systolic 0  MEWS Pulse 3  MEWS RR 0  MEWS LOC 0  MEWS Score 3  MEWS Score Color Yellow  Assess: if the MEWS score is Yellow or Red  Were vital signs accurate and taken at a resting state? Yes  Does the patient meet 2 or more of the SIRS criteria? No  MEWS guidelines implemented  Yes, yellow (previously yellow from ED)  Treat  MEWS Interventions Considered administering scheduled or prn medications/treatments as ordered  Take Vital Signs  Increase Vital Sign Frequency  Yellow: Q2hr x1, continue Q4hrs until patient remains green for 12hrs  Escalate  MEWS: Escalate Yellow: Discuss with charge nurse and consider notifying provider and/or RRT  Notify: Charge Nurse/RN  Name of Charge Nurse/RN Notified [facility]  Provider Notification  Provider Name/Title J. Garner Nash NP  Date Provider Notified 07/28/23  Method of Notification Page (secured chat)  Notification Reason Change in status  Provider response No new orders  Assess: SIRS CRITERIA  SIRS Temperature  0  SIRS Respirations  0  SIRS Pulse 1  SIRS WBC 0  SIRS Score Sum  1

## 2023-07-29 NOTE — Subjective & Objective (Addendum)
Pt seen and examined this AM.  Stable overnight. States he feels like his stomach is getting distended again. Hyponatremia a little worse today. Pt not eating much.  Pt started on low dose nicotine patch. Wants high dose nicotine patch. Tolerating low fat diet yesterday.

## 2023-07-29 NOTE — Plan of Care (Signed)
  Problem: Education: Goal: Knowledge of General Education information will improve Description: Including pain rating scale, medication(s)/side effects and non-pharmacologic comfort measures Outcome: Progressing   Problem: Health Behavior/Discharge Planning: Goal: Ability to manage health-related needs will improve Outcome: Progressing   Problem: Clinical Measurements: Goal: Will remain free from infection Outcome: Progressing   Problem: Activity: Goal: Risk for activity intolerance will decrease Outcome: Progressing   Problem: Coping: Goal: Level of anxiety will decrease Outcome: Progressing   

## 2023-07-29 NOTE — Assessment & Plan Note (Addendum)
07-29-2023 start atenolol 12.5 mg bid. 07-30-2023 BP still elevated. Will increase atenolol to 25 mg bid.

## 2023-07-29 NOTE — Assessment & Plan Note (Addendum)
07-29-2023 only mild elevation of lipase. Stable. 07-30-2023 stable lipase.

## 2023-07-29 NOTE — Assessment & Plan Note (Addendum)
07-29-2023 likely due to alcohol intake and very little solute intake. Asymptomatic at this point. Follow with daily BMP. 07-30-2023 Na down to 127. Will check serum osm, urine osm, urine electrolytes. My intuition says that his hyponatremia is from low solute intake rather than SIADH. TSH is normal at 1.68. repeat BMP in AM.

## 2023-07-29 NOTE — ED Notes (Signed)
Pt endorses epigastric pain. Denies SOB.

## 2023-07-29 NOTE — Consult Note (Addendum)
Referring Provider: Dr. Carollee Herter  Primary Care Physician:  Rosemary Holms Primary Gastroenterologist:  Gentry Fitz   Reason for Consultation: Elevated LFTs, abdominal pain and ascites  HPI: Juan Crawford is a 42 y.o. male with a past medical history of hypertension, elevated LFTs since 2020 and alcohol use disorder.  He developed abdominal bloat and leg swelling which progressively worsened over the past 2 weeks therefore he presented to the ED 07/28/2023 for further evaluation.  History of alcohol use disorder.  He drinks a fifth of liquor daily.  Labs in the ED showed a WBC count of 12.7.  Hemoglobin 13.3.  Hematocrit 38.5.  Platelet 326.  Sodium 130.  Potassium 2.8.  Glucose 169.  BUN < 5.  Creatinine 0.69.  Calcium 7.7.  Total bili 1.0.  Alk phos 324.  AST 74.  ALT 28.  Ethyl alcohol 180.  Blood cultures in process.  Urinalysis ordered, specimen not yet collected.  INR 1.2.  Ammonia 41.  BNP 23.4.  Lipase 176. MDF 10.7.  Lactic acid 2.3.  HIV in process.  CTAP showed mild hepatomegaly with hepatic steatosis, mild pancreatitis and significant ascites.  RUQ sonogram showed hepatic steatosis and moderate ascites with a patent portal vein.  Labs today: WBC 12.8.  Hemoglobin 11.7.  Platelet 273.  Sodium 129.  Potassium 3.3.  BUN 6.  Creatinine 0.70.  Total bili 1.0.  Alk phos 326.  AST 54.  ALT 24.  INR 1.3.  MELD 3.0: 18 at 07/29/2023  6:06 AM MELD-Na: 9 at 07/29/2023  6:06 AM Calculated from: Serum Creatinine: 0.7 mg/dL (Using min of 1 mg/dL) at 13/24/4010  2:72 AM Serum Sodium: 129 mmol/L at 07/29/2023  6:06 AM Total Bilirubin: 1 mg/dL at 53/66/4403  4:74 AM Serum Albumin: 1.9 g/dL at 25/95/6387  5:64 AM INR(ratio): 1.3 at 07/29/2023  6:06 AM Age at listing (hypothetical): 42 years Sex: Male at 07/29/2023  6:06 AM  He underwent a paracentesis earlier this morning, 4L of peritoneal fluid removed. No evidence of SBP.  Peritoneal fluid Gram stain with WBCs seen without other  organisms.  Cannot calculate SAAG with peritoneal albumin level < than 1.5. Peritoneal protein less than 3.0.  Peritoneal amylase level significantly elevated at 3,367. Peritoneal fluid culture and cytology pending.  He endorses drinking heavily since his mid 30s with increased alcohol consumption following a fall which resulted in a fractured jaw and he subsequently underwent 2 surgeries in 2020.  He drinks up to one fifth of whiskey daily.  Last alcohol intake was on Sunday 07/28/2023 at 4 AM.  He developed swelling to his abdomen with epigastric discomfort and associated leg swelling which started 3 weeks ago.  He has intermittent nausea and vomits up partially digested food or nonbloody bilious emesis once weekly for at least the past year.  No heartburn or dysphagia.  No lower abdominal pain.  He passes 2-6 nonbloody loose stools daily, sometimes he skips a day without a bowel movement.  No black or bloody stools.  He denies having any history of liver disease but was previously informed his liver enzymes were elevated likely due to alcohol use.  No prior history of pancreatitis.  No known family history of liver disease.  Father with history of esophageal cancer.  He has lost 20 pounds over the past 2 months then noted weight gain over the past 3 weeks associated with his abdominal and leg swelling.  No fevers or night sweats.  No NSAID use.  No  recent antibiotics.  He is on Amlodipine for hypertension since 2020 but intermittently goes on and off this medication.   Past Medical History:  Diagnosis Date   Hypertension    Nicotine dependence     Past Surgical History:  Procedure Laterality Date   CYST EXCISION Left 02/12/2019   Procedure: DEBRIDEMENT NECK WOUND AND PLACEMENT OF DRAIN;  Surgeon: Christia Reading, MD;  Location: [facility];  Service: ENT;  Laterality: Left;   FACIAL LACERATION REPAIR N/A 12/21/2018   Procedure: Facial Laceration Repair;  Surgeon: Christia Reading, MD;   Location: [facility] OR;  Service: ENT;  Laterality: N/A;  3cm wound on chin   HAND SURGERY  2001   IR PARACENTESIS  07/29/2023   MANDIBULAR HARDWARE REMOVAL Bilateral 02/12/2019   Procedure: MANDIBULAR HARDWARE REMOVAL;  Surgeon: Christia Reading, MD;  Location: [facility];  Service: ENT;  Laterality: Bilateral;   ORIF MANDIBULAR FRACTURE N/A 12/21/2018   Procedure: MAXILLO-MANDIBULAR FIXATION AND ORIF OF MANDIBLE FRACTURE;  Surgeon: Christia Reading, MD;  Location: [facility];  Service: ENT;  Laterality: N/A;    Prior to Admission medications   Medication Sig Start Date End Date Taking? Authorizing Provider  amLODipine (NORVASC) 10 MG tablet Take 10 mg by mouth daily. 08/28/19  Yes [provider]    Current Facility-Administered Medications  Medication Dose Route Frequency Provider Last Rate Last Admin   amLODipine (NORVASC) tablet 10 mg  10 mg Oral Daily Briscoe Burns, MD   10 mg at 07/29/23 1035   folic acid (FOLVITE) tablet 1 mg  1 mg Oral Daily Achille Rich, PA-C   1 mg at 07/29/23 1035   lidocaine (LIDODERM) 5 % 1 patch  1 patch Transdermal Q24H Briscoe Burns, MD   1 patch at 07/28/23 2146   LORazepam (ATIVAN) tablet 1-4 mg  1-4 mg Oral Q1H PRN Achille Rich, PA-C       Or   LORazepam (ATIVAN) injection 1-4 mg  1-4 mg Intravenous Q1H PRN Achille Rich, PA-C       multivitamin with minerals tablet 1 tablet  1 tablet Oral Daily Achille Rich, PA-C   1 tablet at 07/29/23 1035   nicotine (NICODERM CQ - dosed in mg/24 hr) patch 7 mg  7 mg Transdermal Daily Luiz Iron, NP   7 mg at 07/29/23 1037   thiamine (VITAMIN B1) tablet 100 mg  100 mg Oral Daily Achille Rich, PA-C   100 mg at 07/29/23 1035   Or   thiamine (VITAMIN B1) injection 100 mg  100 mg Intravenous Daily Achille Rich, PA-C        Allergies as of 07/28/2023   (No Known Allergies)    Family History  Problem Relation Age of Onset   Diabetes Neg Hx     Social History   Socioeconomic History    Marital status: Single    Spouse name: Not on file   Number of children: Not on file   Years of education: Not on file   Highest education level: Not on file  Occupational History   Not on file  Tobacco Use   Smoking status: Every Day    Current packs/day: 1.00    Average packs/day: 1 pack/day for 20.0 years (20.0 ttl pk-yrs)    Types: E-cigarettes, Cigarettes   Smokeless tobacco: Never  Vaping Use   Vaping status: Never Used  Substance and Sexual Activity   Alcohol use: Yes    Alcohol/week: 6.0 standard drinks of  alcohol    Types: 6 Shots of liquor per week    Comment: heavily   Drug use: Yes    Frequency: 3.0 times per week    Types: Marijuana    Comment: last smoked 02/11/2019   Sexual activity: Not Currently  Other Topics Concern   Not on file  Social History Narrative   Not on file   Social Drivers of Health   Financial Resource Strain: Not on file  Food Insecurity: No Food Insecurity (07/28/2023)   Hunger Vital Sign    Worried About Running Out of Food in the Last Year: Never true    Ran Out of Food in the Last Year: Never true  Transportation Needs: No Transportation Needs (07/28/2023)   PRAPARE - Administrator, Civil Service (Medical): No    Lack of Transportation (Non-Medical): No  Physical Activity: Not on file  Stress: Not on file  Social Connections: Unknown (12/12/2021)   Received from [facility]   Social Network    Social Network: Not on file  Intimate Partner Violence: Not At Risk (07/28/2023)   Humiliation, Afraid, Rape, and Kick questionnaire    Fear of Current or Ex-Partner: No    Emotionally Abused: No    Physically Abused: No    Sexually Abused: No    Review of Systems: Gen: See HPI. CV: Denies chest pain, palpitations or edema. Resp: Denies cough, shortness of breath of hemoptysis.  GI: See HPI.   GU : Denies urinary burning, blood in urine, increased urinary frequency or incontinence. MS: Denies joint pain,  muscles aches or weakness. Derm: Denies rash, itchiness, skin lesions or unhealing ulcers. Psych: + Anxiety. . Heme: Denies easy bruising, bleeding. Neuro:  Denies headaches, dizziness or paresthesias. Endo:  Denies any problems with DM, thyroid or adrenal function.  Physical Exam: Vital signs in last 24 hours: Temp:  [97.9 F (36.6 C)-99.1 F (37.3 C)] 98.9 F (37.2 C) (12/30 0750) Pulse Rate:  [118-141] 118 (12/30 0750) Resp:  [15-21] 16 (12/30 0750) BP: (131-155)/(80-109) 148/103 (12/30 0831) SpO2:  [94 %-98 %] 97 % (12/30 0750) Weight:  [89.7 kg-90.7 kg] 89.8 kg (12/30 0500)   General:  Alert 42 year old male in no acute distress. Head:  Normocephalic and atraumatic. Eyes:  No scleral icterus. Conjunctiva pink. Ears:  Normal auditory acuity. Nose:  No deformity, discharge or lesions. Mouth: Poor dentition.  No ulcers or lesions.  Neck:  Supple. No lymphadenopathy or thyromegaly.  Lungs: Breath sounds clear, decreased in the left lobes.  No wheezes, rhonchi or crackles.  Heart: Regular rate and rhythm, no murmurs. Abdomen: Mildly distended with residual ascites, nontender.  No hepatosplenomegaly.  No palpable mass.  Small umbilical hernia present.  No bruit.  Positive bowel sounds all 4 quadrants. Rectal: Deferred. Musculoskeletal:  Symmetrical without gross deformities.  Pulses:  Normal pulses noted. Extremities: 1+ bilateral lower extremity edema. Neurologic:  Alert and  oriented x 4. No focal deficits. Hands tremulous without asterixis. Skin:  Intact without significant lesions or rashes. Psych:  Alert and cooperative. Normal mood and affect.  Intake/Output from previous day: 12/29 0701 - 12/30 0700 In: 245 [IV Piggyback:245] Out: -  Intake/Output this shift: Total I/O In: 480 [P.O.:480] Out: -   Lab Results: Recent Labs    07/28/23 1135 07/29/23 0606  WBC 12.7* 12.8*  HGB 13.3 11.7*  HCT 38.5* 34.1*  PLT 326 273   BMET Recent Labs    07/28/23 1135  07/28/23 2058 07/29/23  0606  NA 130* 129* 129*  K 2.8* 3.1* 3.3*  CL 86* 89* 88*  CO2 27 26 27   GLUCOSE 169* 103* 169*  BUN <5* 6 6  CREATININE 0.69 0.72 0.70  CALCIUM 7.7* 7.4* 7.5*   LFT Recent Labs    07/29/23 0606  PROT 5.5*  ALBUMIN 1.9*  AST 54*  ALT 24  ALKPHOS 326*  BILITOT 1.0   PT/INR Recent Labs    07/28/23 1629 07/29/23 0606  LABPROT 15.4* 16.2*  INR 1.2 1.3*   Hepatitis Panel No results for input(s): "HEPBSAG", "HCVAB", "HEPAIGM", "HEPBIGM" in the last 72 hours.    Studies/Results: IR Paracentesis Result Date: 07/29/2023 INDICATION: Abdominal distention. New onset ascites. Request for diagnostic and therapeutic paracentesis up to 4 L max. EXAM: ULTRASOUND GUIDED RIGHT LOWER QUADRANT PARACENTESIS MEDICATIONS: 1% plain lidocaine, 5 mL COMPLICATIONS: None immediate. PROCEDURE: Informed written consent was obtained from the patient after a discussion of the risks, benefits and alternatives to treatment. A timeout was performed prior to the initiation of the procedure. Initial ultrasound scanning demonstrates a large amount of ascites within the right lower abdominal quadrant. The right lower abdomen was prepped and draped in the usual sterile fashion. 1% lidocaine was used for local anesthesia. Following this, a 19 gauge, 7-cm, Yueh catheter was introduced. An ultrasound image was saved for documentation purposes. The paracentesis was performed. The catheter was removed and a dressing was applied. The patient tolerated the procedure well without immediate post procedural complication. FINDINGS: A total of approximately 4 L of hazy, rust-colored fluid was removed. Samples were sent to the laboratory as requested by the clinical team. IMPRESSION: Successful ultrasound-guided paracentesis yielding 4 liters of peritoneal fluid. Procedure performed by Brayton El PA-C supervised by Dr. Irish Lack Electronically Signed   By: Irish Lack M.D.   On: 07/29/2023 09:20    US Abdomen Limited RUQ (LIVER/GB) Result Date: 07/28/2023 CLINICAL DATA:  Elevated lipase EXAM: ULTRASOUND ABDOMEN LIMITED RIGHT UPPER QUADRANT COMPARISON:  None Available. FINDINGS: Gallbladder: No gallstones or wall thickening visualized. No sonographic Murphy sign noted by sonographer. Common bile duct: Diameter: Not well visualized Liver: Increase in echogenicity likely related to fatty infiltration. Portal vein is patent on color Doppler imaging with normal direction of blood flow towards the liver. Other: Moderate ascites is identified. IMPRESSION: Fatty liver. Moderate ascites. Electronically Signed   By: Alcide Clever M.D.   On: 07/28/2023 19:07   CT ABDOMEN PELVIS W CONTRAST Result Date: 07/28/2023 CLINICAL DATA:  Acute abdominal pain with abdominal distension, initial encounter EXAM: CT ABDOMEN AND PELVIS WITH CONTRAST TECHNIQUE: Multidetector CT imaging of the abdomen and pelvis was performed using the standard protocol following bolus administration of intravenous contrast. RADIATION DOSE REDUCTION: This exam was performed according to the departmental dose-optimization program which includes automated exposure control, adjustment of the mA and/or kV according to patient size and/or use of iterative reconstruction technique. CONTRAST:  75mL OMNIPAQUE IOHEXOL 350 MG/ML SOLN COMPARISON:  None Available. FINDINGS: Lower chest: Lung bases are free of acute infiltrate or sizable effusion. Hepatobiliary: Fatty infiltration of the liver is noted. Mild hepatomegaly is seen. Gallbladder is within normal limits. Pancreas: Pancreas is well visualized. Very minimal peripancreatic inflammatory changes are noted likely representing some mild pancreatitis. Spleen: Normal in size without focal abnormality. Adrenals/Urinary Tract: Adrenal glands are within normal limits. Kidneys demonstrate a normal enhancement pattern bilaterally. No renal calculi or obstructive changes are seen. The bladder is partially  distended. Stomach/Bowel: No obstructive or inflammatory changes of  the colon are seen. The appendix is within normal limits. Small bowel and stomach are unremarkable. Vascular/Lymphatic: Aortic atherosclerosis. No enlarged abdominal or pelvic lymph nodes. Reproductive: Prostate is unremarkable. Other: Significant ascites is noted with abdominal distension consistent with the given clinical history. Musculoskeletal: No acute bony abnormality is noted. Rounded fluid attenuation density is noted within the right buttock inferiorly measuring up to 4.6 cm. This likely represents a sebaceous cyst. IMPRESSION: Changes of mild pancreatitis. Significant ascites is identified. Fatty infiltration with hepatomegaly. Electronically Signed   By: Alcide Clever M.D.   On: 07/28/2023 17:30    IMPRESSION/PLAN:  42 year old male with a history of alcohol use disorder presents with epigastric pain, abdominal and lower extremity swelling x 3 weeks.  Elevated alk phos, AST and ALT levels with normal total bili.  AST to ALT ratio consistent with chronic alcohol use. Normal PT/INR. CTAP showed mild hepatomegaly with hepatic steatosis without cirrhosis, mild pancreatitis and significant ascites. 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 Admission MDF 10.7, does not fit criteria for Prednisolone. Normal renal function.  No overt signs of hepatic encephalopathy. -Acute hepatitis panel, hep B core total antibody, ANA, SMA, AMA, IgG, ceruloplasmin, iron, ferritin and alpha-1 antitrypsin level -Urine drug screen -Hepatic panel, BMP and INR in am -Await peritoneal fluid culture and cytology results -Patient counseled complete alcohol abstinence imperative, inpatient versus outpatient alcohol rehab encouraged -Consider ECHO to rule out right-sided heart failure -CIWA protocol in process -Continue multivitamin, thiamine and folate daily -Await further  recommendations per Dr. Adela Lank  Nausea, vomiting and epigastric pain. On/off vomiting x 1 year. Epigastric pain x 3 weeks.  -Clear liquid diet -Ondansetron 4 mg p.o. or IV every 6 hours as needed -Eventual EGD -Pantoprazole 40 mg p.o. daily  Leukocytosis, ? secondary to developing hepatitis/pancreatitis.  Urinalysis with reflex ordered, specimen not yet collected.   Arnaldo Natal  07/29/2023, 12:35 PM

## 2023-07-29 NOTE — Progress Notes (Signed)
PROGRESS NOTE    Juan Crawford  ION:[id number] DOB: [birth year] DOA: 07/28/2023 PCP: Bryon Lions, PA-C  Subjective: Pt seen and examined this AM. Just back from his paracentesis. Abd is less distended.   Hospital Course: Ascites -bloating past 3 weeks -edema in bilateral legs -no stomach pain - held off on abx  Alcohol use disorder -1/5 liquor a day, last drink 4p today  GI consulted   HTN  Tachycardic Elevated lactate  Hypokalemia  Mild pancreatitis?   HPI: Juan Crawford is a 42 y.o. male with medical history significant of alcohol use disorder and hypertension presenting with abdominal and leg swelling for the past 3 weeks.   He reports that he first noticed abdominal swelling about 3 weeks ago.  His abdominal swelling progressively became worse over the past couple of weeks and he began developing bilateral leg swelling as well.  He came to the emergency department as his abdominal and leg swelling was not going away on its own.  He does report history of alcohol use, drinks about 1/5 of liquor daily.  His last drink was around 4 AM this morning.  He denies any nausea, vomiting, yellowing of his skin, chest pain, palpitations, shortness of breath, abdominal pain, diarrhea, constipation, urinary changes.  He denies any history of delirium tremens.   ED course: Initial vital signs with tachycardia and elevated BP, but he is afebrile and saturating well on room air.  CBC showing leukocytosis but otherwise with stable hemoglobin and platelet count.  CMP showing hyponatremia, hypokalemia, elevated AST and alk phos, hypoalbuminemia, normal kidney function.  PT mildly elevated with normal INR.  Alcohol level elevated.  Lipase elevated at 182.  Lactate 2.1.  Ammonia 41.  BNP normal.  CT abdomen pelvis showing mild pancreatitis, fatty infiltration with hepatomegaly, and significant ascites.  Right upper quadrant ultrasound showing fatty liver and moderate ascites.  ED  provider has reached out to IR who will perform paracentesis tomorrow.  ED provider also consulted GI, Dr. Meridee Score, who will evaluate patient tomorrow.  [facility] hospitalist asked to evaluate patient for admission.    Significant Events: Admitted 07/28/2023 for new onset ascites   Significant Labs: Lipase elevated at 182. Lactate 2.1. Ammonia 41, ETOH 180  Significant Imaging Studies: CT abdomen pelvis showing mild pancreatitis, fatty infiltration with hepatomegaly, and significant ascites.  Right upper quadrant ultrasound showing fatty liver and moderate ascites.   Antibiotic Therapy: Anti-infectives (From admission, onward)    None       Procedures: 07-29-2023 Paracentesis for 4 liters  Consultants: GI    Assessment and Plan: * Ascites 07-29-2023 s/p 4 liter paracentesis today. Ascites fluid does not meet criteria for SBP.  No abx are indicated at this time.  Alcohol-induced acute pancreatitis without infection or necrosis 07-29-2023 only mild elevation of lipase. Stable.  Alcohol use disorder 07-29-2023 continue with CIWA protocol. Pt admits to drinking 1/5 liquor per day.  Essential hypertension 07-29-2023 start atenolol 12.5 mg bid.  Hypokalemia 07-29-2023 replete with po kcl  Hyponatremia 07-29-2023 likely due to alcohol intake and very little solute intake. Asymptomatic at this point. Follow with daily BMP.   DVT prophylaxis: Place and maintain sequential compression device Start: 07/28/23 2017     Code Status: Full Code Family Communication: pt is decisional Disposition Plan: return home Reason for continuing need for hospitalization: awaiting further recs from GI.  Objective: Vitals:   07/29/23 0602 07/29/23 0750 07/29/23 0808 07/29/23 0831  BP: (!) 155/109 (!) 147/93 Marland Kitchen)  135/99 (!) 148/103  Pulse: (!) 119 (!) 118    Resp: 16 16    Temp: 97.9 F (36.6 C) 98.9 F (37.2 C)    TempSrc: Oral Oral    SpO2: 96% 97%    Weight:      Height:         Intake/Output Summary (Last 24 hours) at 07/29/2023 1415 Last data filed at 07/29/2023 0930 Gross per 24 hour  Intake 724.99 ml  Output --  Net 724.99 ml   Filed Weights   07/28/23 1118 07/28/23 2145 07/29/23 0500  Weight: 90.7 kg 89.7 kg 89.8 kg    Examination:  Physical Exam Vitals and nursing note reviewed.  Constitutional:      General: He is not in acute distress.    Appearance: He is not toxic-appearing.     Comments: Chronically ill appearing  HENT:     Head: Normocephalic and atraumatic.     Nose: Nose normal.  Cardiovascular:     Rate and Rhythm: Normal rate and regular rhythm.  Pulmonary:     Effort: Pulmonary effort is normal.  Abdominal:     General: Bowel sounds are normal. There is distension.     Palpations: Abdomen is soft.     Tenderness: There is no abdominal tenderness.  Musculoskeletal:     Right lower leg: 1+ Pitting Edema present.     Left lower leg: 1+ Pitting Edema present.  Skin:    General: Skin is warm and dry.     Capillary Refill: Capillary refill takes less than 2 seconds.  Neurological:     General: No focal deficit present.     Mental Status: He is alert and oriented to person, place, and time.     Comments: No hand tremors     Data Reviewed: I have personally reviewed following labs and imaging studies  CBC: Recent Labs  Lab 07/28/23 1135 07/29/23 0606  WBC 12.7* 12.8*  NEUTROABS 10.1*  --   HGB 13.3 11.7*  HCT 38.5* 34.1*  MCV 92.1 90.2  PLT 326 273   Basic Metabolic Panel: Recent Labs  Lab 07/28/23 1135 07/28/23 1629 07/28/23 2058 07/29/23 0606  NA 130*  --  129* 129*  K 2.8*  --  3.1* 3.3*  CL 86*  --  89* 88*  CO2 27  --  26 27  GLUCOSE 169*  --  103* 169*  BUN <5*  --  6 6  CREATININE 0.69  --  0.72 0.70  CALCIUM 7.7*  --  7.4* 7.5*  MG  --  1.8  --   --    GFR: Estimated Creatinine Clearance: 137.3 mL/min (by C-G formula based on SCr of 0.7 mg/dL). Liver Function Tests: Recent Labs  Lab  07/28/23 1135 07/28/23 2058 07/29/23 0606  AST 74* 62* 54*  ALT 28 25 24   ALKPHOS 324* 330* 326*  BILITOT 1.0 1.2* 1.0  PROT 6.5 5.8* 5.5*  ALBUMIN 2.1* 1.9* 1.9*   Recent Labs  Lab 07/28/23 1629  LIPASE 176*  182*   Recent Labs  Lab 07/28/23 1629  AMMONIA 41*   Coagulation Profile: Recent Labs  Lab 07/28/23 1629 07/29/23 0606  INR 1.2 1.3*   BNP (last 3 results) Recent Labs    07/28/23 1629  BNP 23.4   Sepsis Labs: Recent Labs  Lab 07/28/23 1710  LATICACIDVEN 2.3*    Recent Results (from the past 240 hours)  Gram stain     Status: None  Collection Time: 07/29/23  8:15 AM   Specimen: Peritoneal Washings  Result Value Ref Range Status   Specimen Description PERITONEAL  Final   Special Requests NONE  Final   Gram Stain   Final    WBC SEEN NO ORGANISMS SEEN CYTOSPIN SMEAR Performed at [facility], [address], [place], [place] [address]    Report Status 07/29/2023 FINAL  Final     Radiology Studies: IR Paracentesis Result Date: 07/29/2023 INDICATION: Abdominal distention. New onset ascites. Request for diagnostic and therapeutic paracentesis up to 4 L max. EXAM: ULTRASOUND GUIDED RIGHT LOWER QUADRANT PARACENTESIS MEDICATIONS: 1% plain lidocaine, 5 mL COMPLICATIONS: None immediate. PROCEDURE: Informed written consent was obtained from the patient after a discussion of the risks, benefits and alternatives to treatment. A timeout was performed prior to the initiation of the procedure. Initial ultrasound scanning demonstrates a large amount of ascites within the right lower abdominal quadrant. The right lower abdomen was prepped and draped in the usual sterile fashion. 1% lidocaine was used for local anesthesia. Following this, a 19 gauge, 7-cm, Yueh catheter was introduced. An ultrasound image was saved for documentation purposes. The paracentesis was performed. The catheter was removed and a dressing was applied. The patient tolerated the procedure  well without immediate post procedural complication. FINDINGS: A total of approximately 4 L of hazy, rust-colored fluid was removed. Samples were sent to the laboratory as requested by the clinical team. IMPRESSION: Successful ultrasound-guided paracentesis yielding 4 liters of peritoneal fluid. Procedure performed by Brayton El PA-C supervised by Dr. Irish Lack Electronically Signed   By: Irish Lack M.D.   On: 07/29/2023 09:20   US Abdomen Limited RUQ (LIVER/GB) Result Date: 07/28/2023 CLINICAL DATA:  Elevated lipase EXAM: ULTRASOUND ABDOMEN LIMITED RIGHT UPPER QUADRANT COMPARISON:  None Available. FINDINGS: Gallbladder: No gallstones or wall thickening visualized. No sonographic Murphy sign noted by sonographer. Common bile duct: Diameter: Not well visualized Liver: Increase in echogenicity likely related to fatty infiltration. Portal vein is patent on color Doppler imaging with normal direction of blood flow towards the liver. Other: Moderate ascites is identified. IMPRESSION: Fatty liver. Moderate ascites. Electronically Signed   By: Alcide Clever M.D.   On: 07/28/2023 19:07   CT ABDOMEN PELVIS W CONTRAST Result Date: 07/28/2023 CLINICAL DATA:  Acute abdominal pain with abdominal distension, initial encounter EXAM: CT ABDOMEN AND PELVIS WITH CONTRAST TECHNIQUE: Multidetector CT imaging of the abdomen and pelvis was performed using the standard protocol following bolus administration of intravenous contrast. RADIATION DOSE REDUCTION: This exam was performed according to the departmental dose-optimization program which includes automated exposure control, adjustment of the mA and/or kV according to patient size and/or use of iterative reconstruction technique. CONTRAST:  75mL OMNIPAQUE IOHEXOL 350 MG/ML SOLN COMPARISON:  None Available. FINDINGS: Lower chest: Lung bases are free of acute infiltrate or sizable effusion. Hepatobiliary: Fatty infiltration of the liver is noted. Mild hepatomegaly is  seen. Gallbladder is within normal limits. Pancreas: Pancreas is well visualized. Very minimal peripancreatic inflammatory changes are noted likely representing some mild pancreatitis. Spleen: Normal in size without focal abnormality. Adrenals/Urinary Tract: Adrenal glands are within normal limits. Kidneys demonstrate a normal enhancement pattern bilaterally. No renal calculi or obstructive changes are seen. The bladder is partially distended. Stomach/Bowel: No obstructive or inflammatory changes of the colon are seen. The appendix is within normal limits. Small bowel and stomach are unremarkable. Vascular/Lymphatic: Aortic atherosclerosis. No enlarged abdominal or pelvic lymph nodes. Reproductive: Prostate is unremarkable. Other: Significant ascites is  noted with abdominal distension consistent with the given clinical history. Musculoskeletal: No acute bony abnormality is noted. Rounded fluid attenuation density is noted within the right buttock inferiorly measuring up to 4.6 cm. This likely represents a sebaceous cyst. IMPRESSION: Changes of mild pancreatitis. Significant ascites is identified. Fatty infiltration with hepatomegaly. Electronically Signed   By: Alcide Clever M.D.   On: 07/28/2023 17:30    Scheduled Meds:  amLODipine  10 mg Oral Daily   atenolol  12.5 mg Oral BID   folic acid  1 mg Oral Daily   lidocaine  1 patch Transdermal Q24H   multivitamin with minerals  1 tablet Oral Daily   nicotine  7 mg Transdermal Daily   potassium chloride  40 mEq Oral Q4H   thiamine  100 mg Oral Daily   Or   thiamine  100 mg Intravenous Daily   Continuous Infusions:   LOS: 0 days   Time spent: 40 minutes  Carollee Herter, DO  [facility]  07/29/2023, 2:15 PM

## 2023-07-29 NOTE — Plan of Care (Signed)
  Problem: Education: Goal: Knowledge of General Education information will improve Description: Including pain rating scale, medication(s)/side effects and non-pharmacologic comfort measures Outcome: Progressing   Problem: Activity: Goal: Risk for activity intolerance will decrease Outcome: Progressing   Problem: Pain Management: Goal: General experience of comfort will improve Outcome: Progressing   Problem: Safety: Goal: Ability to remain free from injury will improve Outcome: Progressing

## 2023-07-30 ENCOUNTER — Inpatient Hospital Stay (HOSPITAL_COMMUNITY): Payer: 59

## 2023-07-30 ENCOUNTER — Encounter (HOSPITAL_COMMUNITY): Payer: Self-pay | Admitting: Internal Medicine

## 2023-07-30 DIAGNOSIS — K859 Acute pancreatitis without necrosis or infection, unspecified: Secondary | ICD-10-CM | POA: Diagnosis not present

## 2023-07-30 DIAGNOSIS — K760 Fatty (change of) liver, not elsewhere classified: Secondary | ICD-10-CM | POA: Diagnosis not present

## 2023-07-30 DIAGNOSIS — I10 Essential (primary) hypertension: Secondary | ICD-10-CM

## 2023-07-30 DIAGNOSIS — F1090 Alcohol use, unspecified, uncomplicated: Secondary | ICD-10-CM | POA: Diagnosis not present

## 2023-07-30 DIAGNOSIS — K7031 Alcoholic cirrhosis of liver with ascites: Secondary | ICD-10-CM | POA: Diagnosis not present

## 2023-07-30 DIAGNOSIS — K852 Alcohol induced acute pancreatitis without necrosis or infection: Secondary | ICD-10-CM | POA: Diagnosis not present

## 2023-07-30 DIAGNOSIS — R160 Hepatomegaly, not elsewhere classified: Secondary | ICD-10-CM | POA: Diagnosis not present

## 2023-07-30 DIAGNOSIS — F1010 Alcohol abuse, uncomplicated: Secondary | ICD-10-CM | POA: Diagnosis not present

## 2023-07-30 DIAGNOSIS — D509 Iron deficiency anemia, unspecified: Secondary | ICD-10-CM

## 2023-07-30 LAB — CBC
HCT: 31.7 % — ABNORMAL LOW (ref 39.0–52.0)
Hemoglobin: 10.5 g/dL — ABNORMAL LOW (ref 13.0–17.0)
MCH: 30.7 pg (ref 26.0–34.0)
MCHC: 33.1 g/dL (ref 30.0–36.0)
MCV: 92.7 fL (ref 80.0–100.0)
Platelets: 296 10*3/uL (ref 150–400)
RBC: 3.42 MIL/uL — ABNORMAL LOW (ref 4.22–5.81)
RDW: 14.3 % (ref 11.5–15.5)
WBC: 11.6 10*3/uL — ABNORMAL HIGH (ref 4.0–10.5)
nRBC: 0 % (ref 0.0–0.2)

## 2023-07-30 LAB — ECHOCARDIOGRAM COMPLETE
Area-P 1/2: 4.6 cm2
Calc EF: 69.2 %
Height: 73 in
S' Lateral: 2.5 cm
Single Plane A2C EF: 76.4 %
Single Plane A4C EF: 60.6 %
Weight: 2982.4 [oz_av]

## 2023-07-30 LAB — COMPREHENSIVE METABOLIC PANEL
ALT: 20 U/L (ref 0–44)
AST: 38 U/L (ref 15–41)
Albumin: 1.5 g/dL — ABNORMAL LOW (ref 3.5–5.0)
Alkaline Phosphatase: 238 U/L — ABNORMAL HIGH (ref 38–126)
Anion gap: 8 (ref 5–15)
BUN: 5 mg/dL — ABNORMAL LOW (ref 6–20)
CO2: 27 mmol/L (ref 22–32)
Calcium: 7.4 mg/dL — ABNORMAL LOW (ref 8.9–10.3)
Chloride: 92 mmol/L — ABNORMAL LOW (ref 98–111)
Creatinine, Ser: 0.71 mg/dL (ref 0.61–1.24)
GFR, Estimated: >60 mL/min (ref >60)
Glucose, Bld: 128 mg/dL — ABNORMAL HIGH (ref 70–99)
Potassium: 3.6 mmol/L (ref 3.5–5.1)
Sodium: 127 mmol/L — ABNORMAL LOW (ref 135–145)
Total Bilirubin: 1.1 mg/dL (ref 0.0–1.2)
Total Protein: 4.9 g/dL — ABNORMAL LOW (ref 6.5–8.1)

## 2023-07-30 LAB — OSMOLALITY: Osmolality: 265 mosm/kg — ABNORMAL LOW (ref 275–295)

## 2023-07-30 LAB — GLUCOSE, CAPILLARY: Glucose-Capillary: 120 mg/dL — ABNORMAL HIGH (ref 70–99)

## 2023-07-30 LAB — LIPASE, BLOOD: Lipase: 164 U/L — ABNORMAL HIGH (ref 11–51)

## 2023-07-30 LAB — HIV ANTIBODY (ROUTINE TESTING W REFLEX): HIV Screen 4th Generation wRfx: NONREACTIVE

## 2023-07-30 MED ORDER — ACETAMINOPHEN 325 MG PO TABS
650.0000 mg | ORAL_TABLET | Freq: Four times a day (QID) | ORAL | Status: DC | PRN
  Administered 2023-08-02: 650 mg via ORAL
  Filled 2023-07-30 (×2): qty 2

## 2023-07-30 MED ORDER — ATENOLOL 25 MG PO TABS
25.0000 mg | ORAL_TABLET | Freq: Two times a day (BID) | ORAL | Status: DC
  Administered 2023-07-30 – 2023-08-01 (×5): 25 mg via ORAL
  Filled 2023-07-30 (×5): qty 1

## 2023-07-30 MED ORDER — PERFLUTREN LIPID MICROSPHERE
1.0000 mL | INTRAVENOUS | Status: AC | PRN
  Administered 2023-07-30: 1 mL via INTRAVENOUS

## 2023-07-30 MED ORDER — NICOTINE 21 MG/24HR TD PT24
21.0000 mg | MEDICATED_PATCH | Freq: Every day | TRANSDERMAL | Status: DC
  Administered 2023-07-30 – 2023-08-05 (×7): 21 mg via TRANSDERMAL
  Filled 2023-07-30 (×7): qty 1

## 2023-07-30 MED ORDER — ACETAMINOPHEN 650 MG RE SUPP: 650.0000 mg | Freq: Four times a day (QID) | RECTAL | Status: DC | PRN

## 2023-07-30 NOTE — Progress Notes (Signed)
 PROGRESS NOTE    Juan Crawford  FMW:[id number] DOB: 05-13-1981 DOA: 07/28/2023 PCP: Valma Lannie LABOR, PA-C  Subjective: Pt seen and examined this AM.  Stable overnight. States he feels like his stomach is getting distended again. Hyponatremia a little worse today. Pt not eating much.  Pt started on low dose nicotine  patch. Wants high dose nicotine  patch. Tolerating low fat diet yesterday.   Hospital Course: HPI: Juan Crawford is a 42 y.o. male with medical history significant of alcohol use disorder and hypertension presenting with abdominal and leg swelling for the past 3 weeks.   He reports that he first noticed abdominal swelling about 3 weeks ago.  His abdominal swelling progressively became worse over the past couple of weeks and he began developing bilateral leg swelling as well.  He came to the emergency department as his abdominal and leg swelling was not going away on its own.  He does report history of alcohol use, drinks about 1/5 of liquor daily.  His last drink was around 4 AM this morning.  He denies any nausea, vomiting, yellowing of his skin, chest pain, palpitations, shortness of breath, abdominal pain, diarrhea, constipation, urinary changes.  He denies any history of delirium tremens.   ED course: Initial vital signs with tachycardia and elevated BP, but he is afebrile and saturating well on room air.  CBC showing leukocytosis but otherwise with stable hemoglobin and platelet count.  CMP showing hyponatremia, hypokalemia, elevated AST and alk phos, hypoalbuminemia, normal kidney function.  PT mildly elevated with normal INR.  Alcohol level elevated.  Lipase elevated at 182.  Lactate 2.1.  Ammonia 41.  BNP normal.  CT abdomen pelvis showing mild pancreatitis, fatty infiltration with hepatomegaly, and significant ascites.  Right upper quadrant ultrasound showing fatty liver and moderate ascites.  ED provider has reached out to IR who will perform paracentesis tomorrow.   ED provider also consulted GI, Dr. Wilhelmenia, who will evaluate patient tomorrow.  [facility] hospitalist asked to evaluate patient for admission.    Significant Events: Admitted 07/28/2023 for new onset ascites   Significant Labs: Lipase elevated at 182. Lactate 2.1. Ammonia 41, ETOH 180  Significant Imaging Studies: CT abdomen pelvis showing mild pancreatitis, fatty infiltration with hepatomegaly, and significant ascites.  Right upper quadrant ultrasound showing fatty liver and moderate ascites.   Antibiotic Therapy: Anti-infectives (From admission, onward)    None       Procedures: 07-29-2023 Paracentesis for 4 liters  Consultants: GI    Assessment and Plan: * Ascites 07-29-2023 s/p 4 liter paracentesis today. Ascites fluid does not meet criteria for SBP.  No abx are indicated at this time. 07-30-2023 GI continues workup for his ascites. May need to re-image in a few days to see if he needs another paracentesis. Discussed with pt that paracentesis would be repeated only if he develops difficulty with breathing or early satiety.  Alcohol-induced acute pancreatitis without infection or necrosis 07-29-2023 only mild elevation of lipase. Stable. 07-30-2023 stable lipase.  Alcohol use disorder 07-29-2023 continue with CIWA protocol. Pt admits to drinking 1/5 liquor per day. 07-30-2023 2 prn ativans given in the last 24 hours.  Essential hypertension 07-29-2023 start atenolol  12.5 mg bid. 07-30-2023 BP still elevated. Will increase atenolol  to 25 mg bid.  Hypokalemia 07-29-2023 replete with po kcl 07-30-2023 resolved. K of 3.6  Hyponatremia 07-29-2023 likely due to alcohol intake and very little solute intake. Asymptomatic at this point. Follow with daily BMP. 07-30-2023 Na down to 127. Will check  serum osm, urine osm, urine electrolytes. My intuition says that his hyponatremia is from low solute intake rather than SIADH. TSH is normal at 1.68. repeat BMP in  AM.    DVT prophylaxis: Place and maintain sequential compression device Start: 07/28/23 2017    Code Status: Full Code Family Communication: no family at bedside. Pt is decisional Disposition Plan: return home Reason for continuing need for hospitalization: continued workup for cirrhosis/ascites  Objective: Vitals:   07/30/23 0357 07/30/23 0500 07/30/23 0551 07/30/23 0744  BP: 124/83  (!) 123/94 132/88  Pulse: (!) 103  (!) 109 (!) 109  Resp: 16   18  Temp: 99 F (37.2 C)   98.8 F (37.1 C)  TempSrc: Oral   Oral  SpO2: 96%  98% 96%  Weight:  84.6 kg    Height:        Intake/Output Summary (Last 24 hours) at 07/30/2023 1248 Last data filed at 07/30/2023 0900 Gross per 24 hour  Intake 1040 ml  Output --  Net 1040 ml   Filed Weights   07/28/23 2145 07/29/23 0500 07/30/23 0500  Weight: 89.7 kg 89.8 kg 84.6 kg    Examination:  Physical Exam Vitals and nursing note reviewed.  Constitutional:      General: He is not in acute distress.    Appearance: He is not toxic-appearing or diaphoretic.  HENT:     Head: Normocephalic and atraumatic.     Nose: Nose normal.  Eyes:     General: No scleral icterus. Cardiovascular:     Rate and Rhythm: Regular rhythm. Tachycardia present.  Pulmonary:     Effort: Pulmonary effort is normal.     Breath sounds: Normal breath sounds.  Abdominal:     General: There is distension.     Palpations: Abdomen is soft.     Tenderness: There is no abdominal tenderness. There is no guarding or rebound.  Musculoskeletal:     Right lower leg: No edema.     Left lower leg: No edema.  Skin:    General: Skin is warm and dry.     Capillary Refill: Capillary refill takes less than 2 seconds.  Neurological:     General: No focal deficit present.     Mental Status: He is alert and oriented to person, place, and time.     Data Reviewed: I have personally reviewed following labs and imaging studies  CBC: Recent Labs  Lab 07/28/23 1135  07/29/23 0606 07/30/23 0344  WBC 12.7* 12.8* 11.6*  NEUTROABS 10.1*  --   --   HGB 13.3 11.7* 10.5*  HCT 38.5* 34.1* 31.7*  MCV 92.1 90.2 92.7  PLT 326 273 296   Basic Metabolic Panel: Recent Labs  Lab 07/28/23 1135 07/28/23 1629 07/28/23 2058 07/29/23 0606 07/30/23 0344  NA 130*  --  129* 129* 127*  K 2.8*  --  3.1* 3.3* 3.6  CL 86*  --  89* 88* 92*  CO2 27  --  26 27 27   GLUCOSE 169*  --  103* 169* 128*  BUN <5*  --  6 6 5*  CREATININE 0.69  --  0.72 0.70 0.71  CALCIUM 7.7*  --  7.4* 7.5* 7.4*  MG  --  1.8  --   --   --    GFR: Estimated Creatinine Clearance: 137.3 mL/min (by C-G formula based on SCr of 0.71 mg/dL). Liver Function Tests: Recent Labs  Lab 07/28/23 1135 07/28/23 2058 07/29/23 0606 07/30/23 0344  AST  74* 62* 54* 38  ALT 28 25 24 20   ALKPHOS 324* 330* 326* 238*  BILITOT 1.0 1.2* 1.0 1.1  PROT 6.5 5.8* 5.5* 4.9*  ALBUMIN  2.1* 1.9* 1.9* 1.5*   Recent Labs  Lab 07/28/23 1629 07/30/23 0344  LIPASE 176*  182* 164*   Recent Labs  Lab 07/28/23 1629  AMMONIA 41*   Coagulation Profile: Recent Labs  Lab 07/28/23 1629 07/29/23 0606  INR 1.2 1.3*   BNP (last 3 results) Recent Labs    07/28/23 1629  BNP 23.4   CBG: Recent Labs  Lab 07/30/23 0746  GLUCAP 120*   Thyroid Function Tests: Recent Labs    07/29/23 0603  TSH 1.681   Anemia Panel: Recent Labs    07/29/23 1318  FERRITIN 1,766*  IRON 26*   Sepsis Labs: Recent Labs  Lab 07/28/23 1710  LATICACIDVEN 2.3*    Recent Results (from the past 240 hours)  Blood culture (routine x 2)     Status: None (Preliminary result)   Collection Time: 07/28/23  3:25 PM   Specimen: BLOOD  Result Value Ref Range Status   Specimen Description BLOOD SITE NOT SPECIFIED  Final   Special Requests   Final    BOTTLES DRAWN AEROBIC AND ANAEROBIC Blood Culture results may not be optimal due to an inadequate volume of blood received in culture bottles   Culture   Final    NO GROWTH 2  DAYS Performed at [facility], [address], [place], [place] [address]    Report Status PENDING  Incomplete  Blood culture (routine x 2)     Status: None (Preliminary result)   Collection Time: 07/28/23  8:58 PM   Specimen: BLOOD RIGHT HAND  Result Value Ref Range Status   Specimen Description BLOOD RIGHT HAND  Final   Special Requests   Final    BOTTLES DRAWN AEROBIC AND ANAEROBIC Blood Culture results may not be optimal due to an excessive volume of blood received in culture bottles   Culture   Final    NO GROWTH 2 DAYS Performed at [facility], [address], [place], [place] [address]    Report Status PENDING  Incomplete  Culture, body fluid w Gram Stain-bottle     Status: None (Preliminary result)   Collection Time: 07/29/23  8:15 AM   Specimen: Peritoneal Washings  Result Value Ref Range Status   Specimen Description PERITONEAL  Final   Special Requests NONE  Final   Culture   Final    NO GROWTH 1 DAY Performed at [facility], [address] [place], [place] [address]    Report Status PENDING  Incomplete  Gram stain     Status: None   Collection Time: 07/29/23  8:15 AM   Specimen: Peritoneal Washings  Result Value Ref Range Status   Specimen Description PERITONEAL  Final   Special Requests NONE  Final   Gram Stain   Final    WBC SEEN NO ORGANISMS SEEN CYTOSPIN SMEAR Performed at [facility], [address] [place], [place] [address]    Report Status 07/29/2023 FINAL  Final     Radiology Studies: ECHOCARDIOGRAM COMPLETE Result Date: 07/30/2023    ECHOCARDIOGRAM REPORT   Patient Name:   NIHAAL FRIESEN Date of Exam: 07/30/2023 Medical Rec #:  [id number]         Height:       73.0 in Accession #:    [id number]  Weight:       186.4 lb Date of Birth:  03-03-1981         BSA:          2.088 m Patient Age:    42 years          BP:           123/94 mmHg Patient Gender: M                 HR:           103 bpm. Exam Location:   Inpatient Procedure: 2D Echo, Cardiac Doppler, Color Doppler and Intracardiac            Opacification Agent Indications:    Ascites  History:        Patient has no prior history of Echocardiogram examinations.                 Arrythmias:Tachycardia; Risk Factors:Hypertension.  Sonographer:    Lanell Maduro Referring Phys: Kayelyn Lemon IMPRESSIONS  1. Left ventricular ejection fraction, by estimation, is 60 to 65%. The left ventricle has normal function. The left ventricle has no regional wall motion abnormalities. Left ventricular diastolic parameters were normal.  2. Right ventricular systolic function is normal. The right ventricular size is normal.  3. The mitral valve is normal in structure. No evidence of mitral valve regurgitation. No evidence of mitral stenosis.  4. The aortic valve is normal in structure. Aortic valve regurgitation is not visualized. No aortic stenosis is present.  5. The inferior vena cava is normal in size with greater than 50% respiratory variability, suggesting right atrial pressure of 3 mmHg. FINDINGS  Left Ventricle: Left ventricular ejection fraction, by estimation, is 60 to 65%. The left ventricle has normal function. The left ventricle has no regional wall motion abnormalities. Definity  contrast agent was given IV to delineate the left ventricular  endocardial borders. The left ventricular internal cavity size was normal in size. There is no left ventricular hypertrophy. Left ventricular diastolic parameters were normal. Right Ventricle: The right ventricular size is normal. No increase in right ventricular wall thickness. Right ventricular systolic function is normal. Left Atrium: Left atrial size was normal in size. Right Atrium: Right atrial size was normal in size. Pericardium: There is no evidence of pericardial effusion. Mitral Valve: The mitral valve is normal in structure. No evidence of mitral valve regurgitation. No evidence of mitral valve stenosis. Tricuspid Valve: The  tricuspid valve is normal in structure. Tricuspid valve regurgitation is not demonstrated. No evidence of tricuspid stenosis. Aortic Valve: The aortic valve is normal in structure. Aortic valve regurgitation is not visualized. No aortic stenosis is present. Pulmonic Valve: The pulmonic valve was normal in structure. Pulmonic valve regurgitation is not visualized. No evidence of pulmonic stenosis. Aorta: The aortic root is normal in size and structure. Venous: The inferior vena cava is normal in size with greater than 50% respiratory variability, suggesting right atrial pressure of 3 mmHg. IAS/Shunts: No atrial level shunt detected by color flow Doppler.  LEFT VENTRICLE PLAX 2D LVIDd:         4.60 cm     Diastology LVIDs:         2.50 cm     LV e' medial:    9.36 cm/s LV PW:         1.00 cm     LV E/e' medial:  7.3 LV IVS:        1.00 cm  LV e' lateral:   11.20 cm/s LVOT diam:     2.40 cm     LV E/e' lateral: 6.1 LV SV:         59 LV SV Index:   28 LVOT Area:     4.52 cm  LV Volumes (MOD) LV vol d, MOD A2C: 65.3 ml LV vol d, MOD A4C: 78.4 ml LV vol s, MOD A2C: 15.4 ml LV vol s, MOD A4C: 30.9 ml LV SV MOD A2C:     49.9 ml LV SV MOD A4C:     78.4 ml LV SV MOD BP:      49.6 ml RIGHT VENTRICLE RV Basal diam:  2.80 cm RV S prime:     11.70 cm/s TAPSE (M-mode): 2.4 cm LEFT ATRIUM             Index        RIGHT ATRIUM           Index LA diam:        3.40 cm 1.63 cm/m   RA Area:     10.20 cm LA Vol (A2C):   20.1 ml 9.63 ml/m   RA Volume:   19.00 ml  9.10 ml/m LA Vol (A4C):   33.6 ml 16.09 ml/m LA Biplane Vol: 28.1 ml 13.46 ml/m  AORTIC VALVE LVOT Vmax:   88.60 cm/s LVOT Vmean:  59.000 cm/s LVOT VTI:    0.130 m  AORTA Ao Root diam: 2.70 cm Ao Asc diam:  3.30 cm MITRAL VALVE MV Area (PHT): 4.60 cm    SHUNTS MV Decel Time: 165 msec    Systemic VTI:  0.13 m MV E velocity: 68.70 cm/s  Systemic Diam: 2.40 cm MV A velocity: 99.00 cm/s MV E/A ratio:  0.69 Aditya Sabharwal Electronically signed by Ria Commander  Signature Date/Time: 07/30/2023/12:30:56 PM    Final    IR Paracentesis Result Date: 07/29/2023 INDICATION: Abdominal distention. New onset ascites. Request for diagnostic and therapeutic paracentesis up to 4 L max. EXAM: ULTRASOUND GUIDED RIGHT LOWER QUADRANT PARACENTESIS MEDICATIONS: 1% plain lidocaine , 5 mL COMPLICATIONS: None immediate. PROCEDURE: Informed written consent was obtained from the patient after a discussion of the risks, benefits and alternatives to treatment. A timeout was performed prior to the initiation of the procedure. Initial ultrasound scanning demonstrates a large amount of ascites within the right lower abdominal quadrant. The right lower abdomen was prepped and draped in the usual sterile fashion. 1% lidocaine  was used for local anesthesia. Following this, a 19 gauge, 7-cm, Yueh catheter was introduced. An ultrasound image was saved for documentation purposes. The paracentesis was performed. The catheter was removed and a dressing was applied. The patient tolerated the procedure well without immediate post procedural complication. FINDINGS: A total of approximately 4 L of hazy, rust-colored fluid was removed. Samples were sent to the laboratory as requested by the clinical team. IMPRESSION: Successful ultrasound-guided paracentesis yielding 4 liters of peritoneal fluid. Procedure performed by Franky Rusk PA-C supervised by Dr. Marcey Moan Electronically Signed   By: Marcey Moan M.D.   On: 07/29/2023 09:20   US  Abdomen Limited RUQ (LIVER/GB) Result Date: 07/28/2023 CLINICAL DATA:  Elevated lipase EXAM: ULTRASOUND ABDOMEN LIMITED RIGHT UPPER QUADRANT COMPARISON:  None Available. FINDINGS: Gallbladder: No gallstones or wall thickening visualized. No sonographic Murphy sign noted by sonographer. Common bile duct: Diameter: Not well visualized Liver: Increase in echogenicity likely related to fatty infiltration. Portal vein is patent on color Doppler imaging with normal  direction of blood  flow towards the liver. Other: Moderate ascites is identified. IMPRESSION: Fatty liver. Moderate ascites. Electronically Signed   By: Oneil Devonshire M.D.   On: 07/28/2023 19:07   CT ABDOMEN PELVIS W CONTRAST Result Date: 07/28/2023 CLINICAL DATA:  Acute abdominal pain with abdominal distension, initial encounter EXAM: CT ABDOMEN AND PELVIS WITH CONTRAST TECHNIQUE: Multidetector CT imaging of the abdomen and pelvis was performed using the standard protocol following bolus administration of intravenous contrast. RADIATION DOSE REDUCTION: This exam was performed according to the departmental dose-optimization program which includes automated exposure control, adjustment of the mA and/or kV according to patient size and/or use of iterative reconstruction technique. CONTRAST:  75mL OMNIPAQUE  IOHEXOL  350 MG/ML SOLN COMPARISON:  None Available. FINDINGS: Lower chest: Lung bases are free of acute infiltrate or sizable effusion. Hepatobiliary: Fatty infiltration of the liver is noted. Mild hepatomegaly is seen. Gallbladder is within normal limits. Pancreas: Pancreas is well visualized. Very minimal peripancreatic inflammatory changes are noted likely representing some mild pancreatitis. Spleen: Normal in size without focal abnormality. Adrenals/Urinary Tract: Adrenal glands are within normal limits. Kidneys demonstrate a normal enhancement pattern bilaterally. No renal calculi or obstructive changes are seen. The bladder is partially distended. Stomach/Bowel: No obstructive or inflammatory changes of the colon are seen. The appendix is within normal limits. Small bowel and stomach are unremarkable. Vascular/Lymphatic: Aortic atherosclerosis. No enlarged abdominal or pelvic lymph nodes. Reproductive: Prostate is unremarkable. Other: Significant ascites is noted with abdominal distension consistent with the given clinical history. Musculoskeletal: No acute bony abnormality is noted. Rounded fluid  attenuation density is noted within the right buttock inferiorly measuring up to 4.6 cm. This likely represents a sebaceous cyst. IMPRESSION: Changes of mild pancreatitis. Significant ascites is identified. Fatty infiltration with hepatomegaly. Electronically Signed   By: Oneil Devonshire M.D.   On: 07/28/2023 17:30    Scheduled Meds:  atenolol   25 mg Oral BID   folic acid   1 mg Oral Daily   lidocaine   1 patch Transdermal Q24H   multivitamin with minerals  1 tablet Oral Daily   nicotine   21 mg Transdermal Daily   thiamine   100 mg Oral Daily   Or   thiamine   100 mg Intravenous Daily   Continuous Infusions:   LOS: 1 day   Time spent: 40 minutes  Camellia Door, DO  [facility]  07/30/2023, 12:48 PM

## 2023-07-30 NOTE — TOC Initial Note (Signed)
 Transition of Care [facility]) - Initial/Assessment Note    Patient Details  Name: Juan Crawford MRN: [id number] Date of Birth: 02-15-1981  [facility]) CM/SW Contact:    Luann SHAUNNA Cumming, LCSW Phone Number: 07/30/2023, 2:10 PM  Clinical Narrative:                  CSW met with pt to discuss alcohol use treatment resources. CSW provided resource list. Pt lives home alone and works full time. CSW discussed different levels of care; OP vs IOP vs Residential. Pt not interested in residential as he does not want to miss any work. CSW discusses OP and IOP options; pt is interested in these options and plans to contact them himself. He is also open to resources for local AA support meetings and [employer]; [employer] provided to pt.   Expected Discharge Plan: Home/Self Care Barriers to Discharge: Continued Medical Work up   Patient Goals and CMS Choice Patient states their goals for this hospitalization and ongoing recovery are:: Maintain abstinance from alcohol          Expected Discharge Plan and Services       Living arrangements for the past 2 months: Single Family Home                                      Prior Living Arrangements/Services Living arrangements for the past 2 months: Single Family Home Lives with:: Self Patient language and need for interpreter reviewed:: Yes        Need for Family Participation in Patient Care: No (Comment) Care giver support system in place?: No (comment)   Criminal Activity/Legal Involvement Pertinent to Current Situation/Hospitalization: No - Comment as needed  Activities of Daily Living   ADL Screening (condition at time of admission) Independently performs ADLs?: Yes (appropriate for developmental age) Is the patient deaf or have difficulty hearing?: No Does the patient have difficulty seeing, even when wearing glasses/contacts?: No Does the patient have difficulty concentrating, remembering, or  making decisions?: No  Permission Sought/Granted                  Emotional Assessment Appearance:: Appears stated age Attitude/Demeanor/Rapport: Engaged Affect (typically observed): Accepting, Pleasant Orientation: : Oriented to Place, Oriented to Self, Oriented to  Time, Oriented to Situation Alcohol / Substance Use: Not Applicable Psych Involvement: No (comment)  Admission diagnosis:  Other ascites [R18.8] Ascites [R18.8] Patient Active Problem List   Diagnosis Date Noted   Hyponatremia 07/29/2023   Hypokalemia 07/29/2023   Alcohol abuse 07/29/2023   Ascites 07/28/2023   Alcohol use disorder 07/28/2023   Alcohol-induced acute pancreatitis without infection or necrosis 07/28/2023   Essential hypertension 01/15/2019   Tobacco abuse    PCP:  Valma Lannie LABOR, PA-C Pharmacy:  No Pharmacies Listed    Social Drivers of Health (SDOH) Social History: SDOH Screenings   Food Insecurity: No Food Insecurity (07/28/2023)  Housing: Low Risk  (07/28/2023)  Transportation Needs: No Transportation Needs (07/28/2023)  Utilities: Not At Risk (07/28/2023)  Depression (PHQ2-9): Low Risk  (07/21/2019)  Social Connections: Unknown (12/12/2021)   Received from [facility]  Tobacco Use: High Risk (07/29/2023)   SDOH Interventions:     Readmission Risk Interventions     No data to display

## 2023-07-30 NOTE — Progress Notes (Addendum)
 [facility] Progress Note  CC:  Elevated LFTs, abdominal pain and ascites   Subjective: Bedside ECHO just completed. He endorses having some nausea with decreased appetite without vomiting.  He has mild upper abdominal discomfort, no significant abdominal pain.  He passed 4 nonbloody brown loose stools earlier today.  No bloody or black stools.  He feels his abdomen is getting more swollen today.  He talked to his boss on the phone, conversation was stressful and he became anxious then requested a dose of lorazepam . He feels less anxious at this time.  He is interested in pursuing alcohol rehab.    Objective:  Vital signs in last 24 hours: Temp:  [98.8 F (37.1 C)-99.5 F (37.5 C)] 98.8 F (37.1 C) (12/31 0744) Pulse Rate:  [100-114] 109 (12/31 0744) Resp:  [16-18] 18 (12/31 0744) BP: (118-132)/(83-94) 132/88 (12/31 0744) SpO2:  [96 %-100 %] 96 % (12/31 0744) Weight:  [84.6 kg] 84.6 kg (12/31 0500) Last BM Date : 07/29/23 General: 42 year old male chronically ill-appearing in no acute distress. Eyes: No scleral icterus. Heart: Regular rate and rhythm, no murmurs. Pulm: Breath sounds clear throughout. Abdomen: Abdomen is distended with moderate ascites, abdomen is not tense.  RLQ paracentesis site dressing intact. Extremities: No significant lower extremity edema. Neurologic:  Alert and  oriented x 4. Grossly normal neurologically. Hands very mildly tremulous without asterixis. Psych:  Alert and cooperative. Normal mood and affect.  Intake/Output from previous day: 12/30 0701 - 12/31 0700 In: 1280 [P.O.:1280] Out: -  Intake/Output this shift: Total I/O In: 240 [P.O.:240] Out: -   Lab Results: Recent Labs    07/28/23 1135 07/29/23 0606 07/30/23 0344  WBC 12.7* 12.8* 11.6*  HGB 13.3 11.7* 10.5*  HCT 38.5* 34.1* 31.7*  PLT 326 273 296   BMET Recent Labs    07/28/23 2058 07/29/23 0606 07/30/23 0344  NA 129* 129* 127*  K 3.1* 3.3* 3.6  CL 89* 88*  92*  CO2 26 27 27   GLUCOSE 103* 169* 128*  BUN 6 6 5*  CREATININE 0.72 0.70 0.71  CALCIUM 7.4* 7.5* 7.4*   LFT Recent Labs    07/30/23 0344  PROT 4.9*  ALBUMIN  1.5*  AST 38  ALT 20  ALKPHOS 238*  BILITOT 1.1   PT/INR Recent Labs    07/28/23 1629 07/29/23 0606  LABPROT 15.4* 16.2*  INR 1.2 1.3*   Hepatitis Panel Recent Labs    07/29/23 1318  HEPBSAG NON REACTIVE  HCVAB NON REACTIVE  HEPAIGM NON REACTIVE  HEPBIGM NON REACTIVE    IR Paracentesis Result Date: 07/29/2023 INDICATION: Abdominal distention. New onset ascites. Request for diagnostic and therapeutic paracentesis up to 4 L max. EXAM: ULTRASOUND GUIDED RIGHT LOWER QUADRANT PARACENTESIS MEDICATIONS: 1% plain lidocaine , 5 mL COMPLICATIONS: None immediate. PROCEDURE: Informed written consent was obtained from the patient after a discussion of the risks, benefits and alternatives to treatment. A timeout was performed prior to the initiation of the procedure. Initial ultrasound scanning demonstrates a large amount of ascites within the right lower abdominal quadrant. The right lower abdomen was prepped and draped in the usual sterile fashion. 1% lidocaine  was used for local anesthesia. Following this, a 19 gauge, 7-cm, Yueh catheter was introduced. An ultrasound image was saved for documentation purposes. The paracentesis was performed. The catheter was removed and a dressing was applied. The patient tolerated the procedure well without immediate post procedural complication. FINDINGS: A total of approximately 4 L of hazy, rust-colored fluid was removed.  Samples were sent to the laboratory as requested by the clinical team. IMPRESSION: Successful ultrasound-guided paracentesis yielding 4 liters of peritoneal fluid. Procedure performed by Franky Rusk PA-C supervised by Dr. Marcey Moan Electronically Signed   By: Marcey Moan M.D.   On: 07/29/2023 09:20   US  Abdomen Limited RUQ (LIVER/GB) Result Date:  07/28/2023 CLINICAL DATA:  Elevated lipase EXAM: ULTRASOUND ABDOMEN LIMITED RIGHT UPPER QUADRANT COMPARISON:  None Available. FINDINGS: Gallbladder: No gallstones or wall thickening visualized. No sonographic Murphy sign noted by sonographer. Common bile duct: Diameter: Not well visualized Liver: Increase in echogenicity likely related to fatty infiltration. Portal vein is patent on color Doppler imaging with normal direction of blood flow towards the liver. Other: Moderate ascites is identified. IMPRESSION: Fatty liver. Moderate ascites. Electronically Signed   By: Oneil Devonshire M.D.   On: 07/28/2023 19:07   CT ABDOMEN PELVIS W CONTRAST Result Date: 07/28/2023 CLINICAL DATA:  Acute abdominal pain with abdominal distension, initial encounter EXAM: CT ABDOMEN AND PELVIS WITH CONTRAST TECHNIQUE: Multidetector CT imaging of the abdomen and pelvis was performed using the standard protocol following bolus administration of intravenous contrast. RADIATION DOSE REDUCTION: This exam was performed according to the departmental dose-optimization program which includes automated exposure control, adjustment of the mA and/or kV according to patient size and/or use of iterative reconstruction technique. CONTRAST:  75mL OMNIPAQUE  IOHEXOL  350 MG/ML SOLN COMPARISON:  None Available. FINDINGS: Lower chest: Lung bases are free of acute infiltrate or sizable effusion. Hepatobiliary: Fatty infiltration of the liver is noted. Mild hepatomegaly is seen. Gallbladder is within normal limits. Pancreas: Pancreas is well visualized. Very minimal peripancreatic inflammatory changes are noted likely representing some mild pancreatitis. Spleen: Normal in size without focal abnormality. Adrenals/Urinary Tract: Adrenal glands are within normal limits. Kidneys demonstrate a normal enhancement pattern bilaterally. No renal calculi or obstructive changes are seen. The bladder is partially distended. Stomach/Bowel: No obstructive or inflammatory  changes of the colon are seen. The appendix is within normal limits. Small bowel and stomach are unremarkable. Vascular/Lymphatic: Aortic atherosclerosis. No enlarged abdominal or pelvic lymph nodes. Reproductive: Prostate is unremarkable. Other: Significant ascites is noted with abdominal distension consistent with the given clinical history. Musculoskeletal: No acute bony abnormality is noted. Rounded fluid attenuation density is noted within the right buttock inferiorly measuring up to 4.6 cm. This likely represents a sebaceous cyst. IMPRESSION: Changes of mild pancreatitis. Significant ascites is identified. Fatty infiltration with hepatomegaly. Electronically Signed   By: Oneil Devonshire M.D.   On: 07/28/2023 17:30   Patient Profile:  Juan Crawford is a 42 y.o. male with a past medical history of hypertension, elevated LFTs since 2020 and alcohol use disorder admitted to the hospital 07/29/2023 with epigastric pain, abdominal swelling and lower extremity edema.   Assessment / Plan:  42 year old male with a history of alcohol use disorder presents with epigastric pain, abdominal swelling and lower extremity edema x 3 weeks. Elevated alk phos, AST and ALT levels with normal total bili. Today Alk phos 326 -> 238 with normal T. Bili, AST/ALT levels. ANA, SMA, IgG, AMA, alpha 1 antitrypsin and ceruloplasmin levels pending.  Negative acute hepatitis panel.  Iron 26.  Ferritin 1766. INR 1.2 -> 1.3. Lipase 182 -> 176 -> 164. CTAP showed mild hepatomegaly with hepatic steatosis without cirrhosis, mild pancreatitis and significant ascites. S/P paracentesis 12/30, 4L of peritoneal fluid removed, no SBP. Peritoneal fluid amylase 3,367. Peritoneal fluid lipase and cytology pending. 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 Admission MDF 10.7, does not fit criteria for Prednisolone. Normal renal function with hyponatremia. Not on diuretics.  ECHO today showed LVEF 60 - 65% and normal RV function. No overt signs of hepatic encephalopathy.  Last alcohol intake was 4 AM 12/29.  -PT/INR, hepatic panel in am -Na+ 127, defer diuretic recommendations to Dr. Leigh.  -Await peritoneal fluid lipase and cytology levels  -He will likely require a paracentesis in the next day or two, please administer IV albumin  following paracentesis  -Consider abdominal MRI/MRCP to further assess the pancreas and PD -Heart healthy diet as tolerated -Patient counseled complete alcohol abstinence imperative, inpatient versus outpatient alcohol rehab encouraged -CIWA protocol in process -Continue multivitamin, thiamine  and folate daily   Nausea, vomiting and epigastric pain. On/off vomiting x 1 year. Epigastric pain x 3 weeks. CTAP showed evidence of mild pancreatitis.  -Ondansetron  4 mg p.o. or IV every 6 hours as needed -Defer endoscopic recommendations to Dr. Leigh  -Pantoprazole 40 mg p.o. daily  Iron deficiency anemia, IV fluids/dilutional component may be contributing to Hg 11.7 -> 10.5. Iron 26. Elevated ferritin level, likely reactive. No overt GI bleeding.  -Defer endoscopic recommendations to Dr. Leigh    Leukocytosis, ? secondary to  pancreatitis.  Urinalysis results pending. WBC 12.8 -> 11.6.   Hyponatremia. NA+ 127.  Urine osmolality, sodium and potassium ordered, specimen not yet collected.  Hypocalcemia.  Calcium level 7.4 Management per the hospitalist      Principal Problem:   Ascites Active Problems:   Alcohol use disorder   Alcohol-induced acute pancreatitis without infection or necrosis   Hyponatremia   Hypokalemia   Essential hypertension   Alcohol abuse     LOS: 1 day   Makinsley Schiavi M Kennedy-Smith  07/30/2023, 1:01 pm

## 2023-07-31 ENCOUNTER — Inpatient Hospital Stay (HOSPITAL_COMMUNITY): Payer: 59

## 2023-07-31 DIAGNOSIS — K852 Alcohol induced acute pancreatitis without necrosis or infection: Secondary | ICD-10-CM | POA: Diagnosis not present

## 2023-07-31 DIAGNOSIS — K7031 Alcoholic cirrhosis of liver with ascites: Secondary | ICD-10-CM

## 2023-07-31 DIAGNOSIS — Z720 Tobacco use: Secondary | ICD-10-CM

## 2023-07-31 DIAGNOSIS — I10 Essential (primary) hypertension: Secondary | ICD-10-CM | POA: Diagnosis not present

## 2023-07-31 DIAGNOSIS — F1090 Alcohol use, unspecified, uncomplicated: Secondary | ICD-10-CM | POA: Diagnosis not present

## 2023-07-31 LAB — CBC
HCT: 33.5 % — ABNORMAL LOW (ref 39.0–52.0)
Hemoglobin: 11.2 g/dL — ABNORMAL LOW (ref 13.0–17.0)
MCH: 31 pg (ref 26.0–34.0)
MCHC: 33.4 g/dL (ref 30.0–36.0)
MCV: 92.8 fL (ref 80.0–100.0)
Platelets: 361 10*3/uL (ref 150–400)
RBC: 3.61 MIL/uL — ABNORMAL LOW (ref 4.22–5.81)
RDW: 14.4 % (ref 11.5–15.5)
WBC: 9.9 10*3/uL (ref 4.0–10.5)
nRBC: 0 % (ref 0.0–0.2)

## 2023-07-31 LAB — COMPREHENSIVE METABOLIC PANEL
ALT: 22 U/L (ref 0–44)
AST: 45 U/L — ABNORMAL HIGH (ref 15–41)
Albumin: 1.6 g/dL — ABNORMAL LOW (ref 3.5–5.0)
Alkaline Phosphatase: 235 U/L — ABNORMAL HIGH (ref 38–126)
Anion gap: 10 (ref 5–15)
BUN: 8 mg/dL (ref 6–20)
CO2: 27 mmol/L (ref 22–32)
Calcium: 7.5 mg/dL — ABNORMAL LOW (ref 8.9–10.3)
Chloride: 96 mmol/L — ABNORMAL LOW (ref 98–111)
Creatinine, Ser: 0.76 mg/dL (ref 0.61–1.24)
GFR, Estimated: >60 mL/min (ref >60)
Glucose, Bld: 114 mg/dL — ABNORMAL HIGH (ref 70–99)
Potassium: 3.4 mmol/L — ABNORMAL LOW (ref 3.5–5.1)
Sodium: 133 mmol/L — ABNORMAL LOW (ref 135–145)
Total Bilirubin: 1.1 mg/dL (ref 0.0–1.2)
Total Protein: 5.1 g/dL — ABNORMAL LOW (ref 6.5–8.1)

## 2023-07-31 LAB — ALPHA-1-ANTITRYPSIN: A-1 Antitrypsin, Ser: 276 mg/dL — ABNORMAL HIGH (ref 101–187)

## 2023-07-31 LAB — PROTIME-INR
INR: 1.4 — ABNORMAL HIGH (ref 0.8–1.2)
Prothrombin Time: 17.2 s — ABNORMAL HIGH (ref 11.4–15.2)

## 2023-07-31 LAB — CERULOPLASMIN: Ceruloplasmin: 21.7 mg/dL (ref 16.0–31.0)

## 2023-07-31 LAB — IGG: IgG (Immunoglobin G), Serum: 991 mg/dL (ref 603–1613)

## 2023-07-31 MED ORDER — POTASSIUM CHLORIDE CRYS ER 20 MEQ PO TBCR
40.0000 meq | EXTENDED_RELEASE_TABLET | ORAL | Status: AC
  Administered 2023-07-31 (×2): 40 meq via ORAL
  Filled 2023-07-31 (×2): qty 2

## 2023-07-31 MED ORDER — OXYCODONE HCL 5 MG PO TABS
5.0000 mg | ORAL_TABLET | Freq: Four times a day (QID) | ORAL | Status: DC | PRN
  Administered 2023-07-31 – 2023-08-05 (×13): 5 mg via ORAL
  Filled 2023-07-31 (×13): qty 1

## 2023-07-31 MED ORDER — ALBUMIN HUMAN 25 % IV SOLN
0.0000 g | Freq: Once | INTRAVENOUS | Status: DC
  Filled 2023-07-31: qty 400

## 2023-07-31 MED ORDER — GADOBUTROL 1 MMOL/ML IV SOLN
8.0000 mL | Freq: Once | INTRAVENOUS | Status: AC | PRN
  Administered 2023-07-31: 8 mL via INTRAVENOUS

## 2023-07-31 NOTE — Progress Notes (Signed)
   07/31/23 1544  Assess: MEWS Score  Temp 99.9 F (37.7 C)  BP 121/85  MAP (mmHg) 95  Pulse Rate (!) 115  Resp 16  SpO2 99 %  O2 Device Room Air  Assess: MEWS Score  MEWS Temp 0  MEWS Systolic 0  MEWS Pulse 2  MEWS RR 0  MEWS LOC 0  MEWS Score 2  MEWS Score Color Yellow  Assess: if the MEWS score is Yellow or Red  Were vital signs accurate and taken at a resting state? Yes  Does the patient meet 2 or more of the SIRS criteria? No  MEWS guidelines implemented  No, previously yellow, continue vital signs every 4 hours  Notify: Charge Nurse/RN  Name of Charge Nurse/RN Notified Kate Ada RN  Provider Notification  Provider Name/Title Camellia Door DO  Date Provider Notified 07/31/23  Time Provider Notified 1624  Method of Notification Page  Notification Reason Other (Comment) (HR elevated)  Date of Provider Response 07/31/23  Time of Provider Response 1630  Assess: SIRS CRITERIA  SIRS Temperature  0  SIRS Respirations  0  SIRS Pulse 1  SIRS WBC 0  SIRS Score Sum  1   Pt is resting in bed, eating on mango ice. HR- 115-117 at rest. Order for oxycodone , will give once pharmacy has verified the order. Will continue with plan of care.

## 2023-07-31 NOTE — Plan of Care (Signed)
  Problem: Clinical Measurements: Goal: Ability to maintain clinical measurements within normal limits will improve Outcome: Progressing Goal: Respiratory complications will improve Outcome: Progressing   Problem: Activity: Goal: Risk for activity intolerance will decrease Outcome: Progressing   Problem: Nutrition: Goal: Adequate nutrition will be maintained Outcome: Progressing

## 2023-07-31 NOTE — Assessment & Plan Note (Addendum)
 07-31-2023 his cirrhosis workup thus far has been unrevealing. 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  Defer to GI if he needs EGD to evaluate for portal gastropathy. Defer to GI if pt needs SBP prophylaxis, aldactone . Pt already on atenolol  for his HTN. Can change over to coreg/nadolol/lopressor /propranolol if desired by GI. 08-01-2023 still somewhat tachy. BP has some room to increase atenolol . Will increase to 37.5 mg bid. 08-02-2023 HR right at 100. On atenolol  37.5 mg bid. No further room in BP to increase. Now on lasix  and aldcatone as well. Monitor BP.  08-05-2023 stable on atenolol  37.5 mg bid, lasix  60 mg bid, aldactone  100 mg qday. Could change atenolol  to Toprol -XL 75 mg daily at discharge to ease pill burden.

## 2023-07-31 NOTE — Progress Notes (Signed)
 [facility] Subjective: Patient complains of some abdominal discomfort and a sleepless night as he was taken for an MRI at about 3 AM.  The MRI revealed findings compatible with acute pancreatitis without any evidence of pancreatic necrosis with 2 fluid collections in the head and tail of the pancreas the largest 1 in the distal tail measuring 1.7 x 1.1 cm suspicious for pseudocyst along with large volume ascites in the abdomen partially loculated with overlying enhancement of the peritoneal reflections.  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 Mild wall thickening was visualized in the small bowel loops. There is no evidence of ductal dilation the pancreas he has had a few loose BMs this morning.  He has had mild nausea without vomiting.  Objective: Vital signs in last 24 hours: Temp:  [98 F (36.7 C)-99.4 F (37.4 C)] 98.9 F (37.2 C) (01/01 0811) Pulse Rate:  [97-118] 108 (01/01 0600) Resp:  [16-19] 16 (01/01 0811) BP: (117-137)/(77-96) 137/89 (01/01 0811) SpO2:  [97 %-99 %] 99 % (01/01 0811) Weight:  [87.3 kg] 87.3 kg (01/01 0500) Last BM Date : 07/29/23  Intake/Output from previous day: 12/31 0701 - 01/01 0700 In: 1560 [P.O.:1560] Out: -  Intake/Output this shift: No intake/output data recorded.  General appearance: Chronically ill-appearing but alert, cooperative, appears older than stated age, cachectic, distracted, icteric, mild distress, and pale Resp: clear to auscultation bilaterally Cardio: regular rate and rhythm, S1, S2 normal, no murmur, click, rub or gallop GI: soft, distended secondary to ascites with fluid thrill. non-tender; bowel sounds normal; no masses,  no organomegaly  Lab Results: Recent Labs    07/29/23 0606 07/30/23 0344 07/31/23 0810  WBC 12.8* 11.6* 9.9  HGB 11.7* 10.5* 11.2*   HCT 34.1* 31.7* 33.5*  PLT 273 296 361   BMET Recent Labs    07/29/23 0606 07/30/23 0344 07/31/23 0810  NA 129* 127* 133*  K 3.3* 3.6 3.4*  CL 88* 92* 96*  CO2 27 27 27   GLUCOSE 169* 128* 114*  BUN 6 5* 8  CREATININE 0.70 0.71 0.76  CALCIUM 7.5* 7.4* 7.5*   LFT Recent Labs    07/31/23 0810  PROT 5.1*  ALBUMIN  1.6*  AST 45*  ALT 22  ALKPHOS 235*  BILITOT 1.1   PT/INR Recent Labs    07/28/23 1629 07/29/23 0606  LABPROT 15.4* 16.2*  INR 1.2 1.3*   Hepatitis Panel Recent Labs    07/29/23 1318  HEPBSAG NON REACTIVE  HCVAB NON REACTIVE  HEPAIGM NON REACTIVE  HEPBIGM NON REACTIVE   Studies/Results: MR ABDOMEN MRCP W WO CONTAST Result Date: 07/31/2023 CLINICAL DATA:  Pancreatitis. EXAM: MRI ABDOMEN WITHOUT AND WITH CONTRAST (INCLUDING MRCP) TECHNIQUE: Multiplanar multisequence MR imaging of the abdomen was performed both before and after the administration of intravenous contrast. Heavily T2-weighted images of the biliary and pancreatic ducts were obtained, and three-dimensional MRCP images were rendered by post processing. CONTRAST:  8mL GADAVIST  GADOBUTROL  1 MMOL/ML IV SOLN COMPARISON:  CT 07/28/2023 FINDINGS: Lower chest: No acute findings. Hepatobiliary: Marked diffuse hepatic steatosis. There is squaring of the caudate lobe and widening of the falciform ligament. Contour of the liver appears slightly irregular and nodular. No focal enhancing liver abnormality. Mild pericholecystic fluid noted, nonspecific in the setting of ascites. No gallstones identified. The proximal common bile duct measures up to 8 mm, image 28/4. No signs of  choledocholithiasis. Pancreas: No main duct dilatation. Diffuse low attenuation edema involving the body and tail of pancreas with surrounding soft tissue stranding. Cystic lesion within the head of pancreas appears uniformly T2 hyperintense measuring 0.9 x 0.6 cm, image 24/5. Adjacent to the tail of pancreas there is a small fluid collection  measuring 1.7 x 1.1 cm, image 80/5. No signs pancreatic necrosis. No main duct dilatation or discrete mass. Spleen:  Within normal limits in size and appearance. Adrenals/Urinary Tract: Normal adrenal glands. Kidneys are unremarkable. Stomach/Bowel: Stomach appears within normal limits. No pathologic dilatation of the visualized bowel loops. Mild increase caliber scratch set mild wall thickening involving the visualized small bowel loops noted with wall thickness measuring up to 6 mm. Vascular/Lymphatic: Normal caliber abdominal aorta. The portal vein, portal venous confluence and SMV the are patent. 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 No abdominal adenopathy. Other: Large volume of ascites is identified within the abdomen. This ascites appears at least partially loculated with overlying enhancement of the peritoneal reflections, image 105/1304. Musculoskeletal: No suspicious bone lesions identified. IMPRESSION: 1. Imaging findings compatible with acute pancreatitis. No signs of pancreatic necrosis. 2. There are 2 fluid collections associated with the head and tail of pancreas. The largest is adjacent to the distal tail measuring 1.7 x 1.1 cm. Findings are favored to represent small pseudocysts. 3. Large volume of ascites is identified within the abdomen. This ascites appears at least partially loculated with overlying enhancement of the peritoneal reflections. Correlate for any clinical signs or symptoms of peritonitis. 4. Marked diffuse hepatic steatosis. There is squaring of the caudate lobe and widening of the falciform ligament. Contour of the liver appears slightly irregular and nodular. Findings are concerning for cirrhosis. 5. 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 6. Mild wall thickening involving the visualized small bowel loops noted with  wall thickness measuring up to 6 mm. In the setting of cirrhosis and ascites is a nonspecific finding. Electronically Signed   By: Waddell Calk M.D.   On: 07/31/2023 09:23   MR 3D Recon At Scanner Result Date: 07/31/2023 CLINICAL DATA:  Pancreatitis. EXAM: MRI ABDOMEN WITHOUT AND WITH CONTRAST (INCLUDING MRCP) TECHNIQUE: Multiplanar multisequence MR imaging of the abdomen was performed both before and after the administration of intravenous contrast. Heavily T2-weighted images of the biliary and pancreatic ducts were obtained, and three-dimensional MRCP images were rendered by post processing. CONTRAST:  8mL GADAVIST  GADOBUTROL  1 MMOL/ML IV SOLN COMPARISON:  CT 07/28/2023 FINDINGS: Lower chest: No acute findings. Hepatobiliary: Marked diffuse hepatic steatosis. There is squaring of the caudate lobe and widening of the falciform ligament. Contour of the liver appears slightly irregular and nodular. No focal enhancing liver abnormality. Mild pericholecystic fluid noted, nonspecific in the setting of ascites. No gallstones identified. The proximal common bile duct measures up to 8 mm, image 28/4. No signs of choledocholithiasis. Pancreas: No main duct dilatation. Diffuse low attenuation edema involving the body and tail of pancreas with surrounding soft tissue stranding. Cystic lesion within the head of pancreas appears uniformly T2 hyperintense measuring 0.9 x 0.6 cm, image 24/5. Adjacent to the tail of pancreas there is a small fluid collection measuring 1.7 x 1.1 cm, image 80/5. No signs pancreatic necrosis. No main duct dilatation or discrete mass. Spleen:  Within normal limits in size and appearance. Adrenals/Urinary Tract: Normal adrenal glands. Kidneys are unremarkable. Stomach/Bowel: Stomach appears  within normal limits. No pathologic dilatation of the visualized bowel loops. Mild increase caliber scratch set mild wall thickening involving the visualized small bowel loops noted with wall thickness measuring  up to 6 mm. Vascular/Lymphatic: Normal caliber abdominal aorta. The portal vein, portal venous confluence and SMV the are patent. 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 No abdominal adenopathy. Other: Large volume of ascites is identified within the abdomen. This ascites appears at least partially loculated with overlying enhancement of the peritoneal reflections, image 105/1304. Musculoskeletal: No suspicious bone lesions identified. IMPRESSION: 1. Imaging findings compatible with acute pancreatitis. No signs of pancreatic necrosis. 2. There are 2 fluid collections associated with the head and tail of pancreas. The largest is adjacent to the distal tail measuring 1.7 x 1.1 cm. Findings are favored to represent small pseudocysts. 3. Large volume of ascites is identified within the abdomen. This ascites appears at least partially loculated with overlying enhancement of the peritoneal reflections. Correlate for any clinical signs or symptoms of peritonitis. 4. Marked diffuse hepatic steatosis. There is squaring of the caudate lobe and widening of the falciform ligament. Contour of the liver appears slightly irregular and nodular. Findings are concerning for cirrhosis. 5. 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 6. Mild wall thickening involving the visualized small bowel loops noted with wall thickness measuring up to 6 mm. In the setting of cirrhosis and ascites is a nonspecific finding. Electronically Signed   By: Waddell Calk M.D.   On: 07/31/2023 09:23   ECHOCARDIOGRAM COMPLETE Result Date: 07/30/2023    ECHOCARDIOGRAM REPORT   Patient Name:   ARVINE CLAYBURN Date of Exam: 07/30/2023 Medical Rec #:  [id number]         Height:       73.0 in Accession #:    [id number]        Weight:       186.4 lb Date of Birth:  23-Aug-1980         BSA:           2.088 m Patient Age:    42 years          BP:           123/94 mmHg Patient Gender: M                 HR:           103 bpm. Exam Location:  Inpatient Procedure: 2D Echo, Cardiac Doppler, Color Doppler and Intracardiac            Opacification Agent Indications:    Ascites  History:        Patient has no prior history of Echocardiogram examinations.                 Arrythmias:Tachycardia; Risk Factors:Hypertension.  Sonographer:    Lanell Maduro Referring Phys: ERIC CHEN IMPRESSIONS  1. Left ventricular ejection fraction, by estimation, is 60 to 65%. The left ventricle has normal function. The left ventricle has no regional wall motion abnormalities. Left ventricular diastolic parameters were normal.  2. Right ventricular systolic function is normal. The right ventricular size is normal.  3. The mitral valve is normal in structure. No evidence of mitral valve regurgitation. No evidence of mitral stenosis.  4. The aortic valve is normal in structure. Aortic valve regurgitation is not visualized. No aortic stenosis is present.  5. The inferior vena cava is normal in size with greater than 50% respiratory variability, suggesting right atrial pressure of 3 mmHg. FINDINGS  Left Ventricle: Left ventricular ejection fraction, by estimation, is 60 to 65%. The left ventricle has normal function. The left ventricle has no regional wall motion abnormalities. Definity  contrast agent was given IV to delineate the left ventricular  endocardial borders. The left ventricular internal cavity size was normal in size. There is no left ventricular hypertrophy. Left ventricular diastolic parameters were normal. Right Ventricle: The right ventricular size is normal. No increase in right ventricular wall thickness. Right ventricular systolic function is normal. Left Atrium: Left atrial size was normal in size. Right Atrium: Right atrial size was normal in size. Pericardium: There is no evidence of pericardial effusion. Mitral Valve:  The mitral valve is normal in structure. No evidence of mitral valve regurgitation. No evidence of mitral valve stenosis. Tricuspid Valve: The tricuspid valve is normal in structure. Tricuspid valve regurgitation is not demonstrated. No evidence of tricuspid stenosis. Aortic Valve: The aortic valve is normal in structure. Aortic valve regurgitation is not visualized. No aortic stenosis is present. Pulmonic Valve: The pulmonic valve was normal in structure. Pulmonic valve regurgitation is not visualized. No evidence of pulmonic stenosis. Aorta: The aortic root is normal in size and structure. Venous: The inferior vena cava is normal in size with greater than 50% respiratory variability, suggesting right atrial pressure of 3 mmHg. IAS/Shunts: No atrial level shunt detected by color flow Doppler.  LEFT VENTRICLE PLAX 2D LVIDd:         4.60 cm     Diastology LVIDs:         2.50 cm     LV e' medial:    9.36 cm/s LV PW:         1.00 cm     LV E/e' medial:  7.3 LV IVS:        1.00 cm     LV e' lateral:   11.20 cm/s LVOT diam:     2.40 cm     LV E/e' lateral: 6.1 LV SV:         59 LV SV Index:   28 LVOT Area:     4.52 cm  LV Volumes (MOD) LV vol d, MOD A2C: 65.3 ml LV vol d, MOD A4C: 78.4 ml LV vol s, MOD A2C: 15.4 ml LV vol s, MOD A4C: 30.9 ml LV SV MOD A2C:     49.9 ml LV SV MOD A4C:     78.4 ml LV SV MOD BP:      49.6 ml RIGHT VENTRICLE RV Basal diam:  2.80 cm RV S prime:     11.70 cm/s TAPSE (M-mode): 2.4 cm LEFT ATRIUM             Index        RIGHT ATRIUM           Index LA diam:        3.40 cm 1.63 cm/m   RA Area:     10.20 cm LA Vol (A2C):   20.1 ml 9.63 ml/m   RA Volume:   19.00 ml  9.10 ml/m LA Vol (A4C):   33.6 ml 16.09 ml/m LA Biplane Vol: 28.1 ml 13.46 ml/m  AORTIC VALVE LVOT Vmax:   88.60 cm/s LVOT Vmean:  59.000 cm/s LVOT VTI:    0.130 m  AORTA Ao Root diam: 2.70 cm Ao Asc diam:  3.30 cm MITRAL VALVE  MV Area (PHT): 4.60 cm    SHUNTS MV Decel Time: 165 msec    Systemic VTI:  0.13 m MV E velocity: 68.70  cm/s  Systemic Diam: 2.40 cm MV A velocity: 99.00 cm/s MV E/A ratio:  0.69 Aditya Sabharwal Electronically signed by Ria Commander Signature Date/Time: 07/30/2023/12:30:56 PM    Final     Medications: I have reviewed the patient's current medications. Prior to Admission:  Medications Prior to Admission  Medication Sig Dispense Refill Last Dose/Taking   amLODipine  (NORVASC ) 10 MG tablet Take 10 mg by mouth daily.   07/27/2023   Scheduled:  atenolol   25 mg Oral BID   folic acid   1 mg Oral Daily   lidocaine   1 patch Transdermal Q24H   multivitamin with minerals  1 tablet Oral Daily   nicotine   21 mg Transdermal Daily   potassium chloride   40 mEq Oral Q4H   thiamine   100 mg Oral Daily   Or   thiamine   100 mg Intravenous Daily   Continuous: PRN:acetaminophen  **OR** acetaminophen , LORazepam  **OR** LORazepam   Assessment/Plan: 1) Alcoholic pancreatitis with pseudocyst and ascites-[ascitic lipase is still pending], hyponatremia and severe hypoalbuminemia-patient's had 4 L of ascitic fluid removed on 07/29/2023 and is awaiting another tap in a couple of days. 2) Acute alcoholic hepatitis. 3)  iron deficiency anemia with no evidence of GI bleeding.  Patient will need further evaluation with an EGD to rule out varices once he is medically stable 4) HTN.  LOS: 2 days   Renaye Sous 07/31/2023, 9:58 AM

## 2023-07-31 NOTE — Assessment & Plan Note (Signed)
 07-31-2023 continue with nicotine patch.

## 2023-07-31 NOTE — Progress Notes (Signed)
 PROGRESS NOTE    Juan Crawford  FMW:[id number] DOB: 08-06-1980 DOA: 07/28/2023 PCP: Valma Lannie LABOR, PA-C  Subjective: Pt seen and examined this AM.  Stable overnight.  Had MRCP last night. Abd distension is worse today.  Urine electrolytes never collected yesterday. Na up to 133 today. From 127 yesterday. Coincides with increased solute intake.  Bedside abd U/S shows moderate ascites again.   Hospital Course: HPI: Juan Crawford is a 43 y.o. male with medical history significant of alcohol use disorder and hypertension presenting with abdominal and leg swelling for the past 3 weeks.   He reports that he first noticed abdominal swelling about 3 weeks ago.  His abdominal swelling progressively became worse over the past couple of weeks and he began developing bilateral leg swelling as well.  He came to the emergency department as his abdominal and leg swelling was not going away on its own.  He does report history of alcohol use, drinks about 1/5 of liquor daily.  His last drink was around 4 AM this morning.  He denies any nausea, vomiting, yellowing of his skin, chest pain, palpitations, shortness of breath, abdominal pain, diarrhea, constipation, urinary changes.  He denies any history of delirium tremens.   ED course: Initial vital signs with tachycardia and elevated BP, but he is afebrile and saturating well on room air.  CBC showing leukocytosis but otherwise with stable hemoglobin and platelet count.  CMP showing hyponatremia, hypokalemia, elevated AST and alk phos, hypoalbuminemia, normal kidney function.  PT mildly elevated with normal INR.  Alcohol level elevated.  Lipase elevated at 182.  Lactate 2.1.  Ammonia 41.  BNP normal.  CT abdomen pelvis showing mild pancreatitis, fatty infiltration with hepatomegaly, and significant ascites.  Right upper quadrant ultrasound showing fatty liver and moderate ascites.  ED provider has reached out to IR who will perform paracentesis  tomorrow.  ED provider also consulted GI, Dr. Wilhelmenia, who will evaluate patient tomorrow.  [facility] hospitalist asked to evaluate patient for admission.    Significant Events: Admitted 07/28/2023 for new onset ascites   Significant Labs: Lipase elevated at 182. Lactate 2.1. Ammonia 41, ETOH 180  Significant Imaging Studies: CT abdomen pelvis showing mild pancreatitis, fatty infiltration with hepatomegaly, and significant ascites.  Right upper quadrant ultrasound showing fatty liver and moderate ascites.   Antibiotic Therapy: Anti-infectives (From admission, onward)    None       Procedures: 07-29-2023 Paracentesis for 4 liters  Consultants: GI    Assessment and Plan: * Ascites 07-29-2023 s/p 4 liter paracentesis today. Ascites fluid does not meet criteria for SBP.  No abx are indicated at this time. 07-30-2023 GI continues workup for his ascites. May need to re-image in a few days to see if he needs another paracentesis. Discussed with pt that paracentesis would be repeated only if he develops difficulty with breathing or early satiety. 07-31-2023 pt with moderate amount of ascites again. Pt states abd distension is getting more uncomfortable again. Will ask IR for another large volume paracentesis tomorrow.  Alcoholic cirrhosis of liver with ascites (HCC) 07-31-2023 his cirrhosis workup thus far has been unrevealing. 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  Defer to GI if he needs EGD to evaluate for portal gastropathy. Defer to GI if pt needs SBP prophylaxis, aldactone . Pt already on atenolol  for his HTN. Can change over to coreg/nadolol/lopressor /propranolol if desired by GI.  Alcohol-induced acute pancreatitis without infection or  necrosis 07-29-2023 only mild elevation of lipase. Stable. 07-30-2023 stable lipase. 07-31-2023 MRCP shows some cyst in his pancreas.  Alcohol use disorder 07-29-2023 continue with  CIWA protocol. Pt admits to drinking 1/5 liquor per day. 07-30-2023 2 prn ativans given in the last 24 hours. 07-31-2023 2 prn ativans given since I last saw patient yesterday. Can stop CIWA soon.  Essential hypertension 07-29-2023 start atenolol  12.5 mg bid. 07-30-2023 BP still elevated. Will increase atenolol  to 25 mg bid. 07-31-2023 continue with current dose of atenolol  25 mg bid.  Hypokalemia 07-29-2023 replete with po kcl 07-30-2023 resolved. K of 3.6 07-31-2023 give more po kcl.  Hyponatremia 07-29-2023 likely due to alcohol intake and very little solute intake. Asymptomatic at this point. Follow with daily BMP. 07-30-2023 Na down to 127. Will check serum osm, urine osm, urine electrolytes. My intuition says that his hyponatremia is from low solute intake rather than SIADH. TSH is normal at 1.68. repeat BMP in AM. 07-31-2023 unfortunately, his urine electrolytes not colleted when his sodium was low. Now his Na is up to 133. He is eating better last 24 hours. All this points to low solute intake as cause of his hyponatremia and not volume overload or SIADH  Tobacco abuse 07-31-2023 continue with nicotine  patch.       DVT prophylaxis: Place and maintain sequential compression device Start: 07/28/23 2017    Code Status: Full Code Family Communication: no family at bedside Disposition Plan: return home Reason for continuing need for hospitalization: awaiting further input by GI. Plan for paracentesis tomorrow.  Objective: Vitals:   07/31/23 0343 07/31/23 0500 07/31/23 0600 07/31/23 0811  BP: (!) 121/96  (!) 127/91 137/89  Pulse: (!) 117  (!) 108   Resp: 18  19 16   Temp: 98.5 F (36.9 C)  98 F (36.7 C) 98.9 F (37.2 C)  TempSrc: Oral  Oral Oral  SpO2: 97%  97% 99%  Weight:  87.3 kg    Height:        Intake/Output Summary (Last 24 hours) at 07/31/2023 1012 Last data filed at 07/31/2023 0300 Gross per 24 hour  Intake 1320 ml  Output --  Net 1320 ml   Filed  Weights   07/29/23 0500 07/30/23 0500 07/31/23 0500  Weight: 89.8 kg 84.6 kg 87.3 kg    Examination:  Physical Exam Vitals and nursing note reviewed.  Constitutional:      General: He is not in acute distress.    Appearance: He is not toxic-appearing or diaphoretic.  HENT:     Head: Normocephalic and atraumatic.     Nose: Nose normal.  Eyes:     General: No scleral icterus. Cardiovascular:     Rate and Rhythm: Normal rate and regular rhythm.  Pulmonary:     Effort: Pulmonary effort is normal.     Breath sounds: Normal breath sounds.  Abdominal:     General: Bowel sounds are normal. There is distension.     Tenderness: There is no abdominal tenderness.     Comments: Bedside abd u/s shows ascites in both RLQ and LLQ that would be amenable to paracentesis.  Neurological:     Mental Status: He is alert.     Data Reviewed: I have personally reviewed following labs and imaging studies  CBC: Recent Labs  Lab 07/28/23 1135 07/29/23 0606 07/30/23 0344 07/31/23 0810  WBC 12.7* 12.8* 11.6* 9.9  NEUTROABS 10.1*  --   --   --   HGB 13.3 11.7* 10.5* 11.2*  HCT 38.5* 34.1* 31.7* 33.5*  MCV 92.1 90.2 92.7 92.8  PLT 326 273 296 361   Basic Metabolic Panel: Recent Labs  Lab 07/28/23 1135 07/28/23 1629 07/28/23 2058 07/29/23 0606 07/30/23 0344 07/31/23 0810  NA 130*  --  129* 129* 127* 133*  K 2.8*  --  3.1* 3.3* 3.6 3.4*  CL 86*  --  89* 88* 92* 96*  CO2 27  --  26 27 27 27   GLUCOSE 169*  --  103* 169* 128* 114*  BUN <5*  --  6 6 5* 8  CREATININE 0.69  --  0.72 0.70 0.71 0.76  CALCIUM 7.7*  --  7.4* 7.5* 7.4* 7.5*  MG  --  1.8  --   --   --   --    GFR: Estimated Creatinine Clearance: 137.3 mL/min (by C-G formula based on SCr of 0.76 mg/dL). Liver Function Tests: Recent Labs  Lab 07/28/23 1135 07/28/23 2058 07/29/23 0606 07/30/23 0344 07/31/23 0810  AST 74* 62* 54* 38 45*  ALT 28 25 24 20 22   ALKPHOS 324* 330* 326* 238* 235*  BILITOT 1.0 1.2* 1.0 1.1 1.1   PROT 6.5 5.8* 5.5* 4.9* 5.1*  ALBUMIN  2.1* 1.9* 1.9* 1.5* 1.6*   Recent Labs  Lab 07/28/23 1629 07/30/23 0344  LIPASE 176*  182* 164*   Recent Labs  Lab 07/28/23 1629  AMMONIA 41*   Coagulation Profile: Recent Labs  Lab 07/28/23 1629 07/29/23 0606 07/31/23 0810  INR 1.2 1.3* 1.4*   BNP (last 3 results) Recent Labs    07/28/23 1629  BNP 23.4   CBG: Recent Labs  Lab 07/30/23 0746  GLUCAP 120*   Thyroid Function Tests: Recent Labs    07/29/23 0603  TSH 1.681   Anemia Panel: Recent Labs    07/29/23 1318  FERRITIN 1,766*  IRON 26*   Sepsis Labs: Recent Labs  Lab 07/28/23 1710  LATICACIDVEN 2.3*    Recent Results (from the past 240 hours)  Blood culture (routine x 2)     Status: None (Preliminary result)   Collection Time: 07/28/23  3:25 PM   Specimen: BLOOD  Result Value Ref Range Status   Specimen Description BLOOD SITE NOT SPECIFIED  Final   Special Requests   Final    BOTTLES DRAWN AEROBIC AND ANAEROBIC Blood Culture results may not be optimal due to an inadequate volume of blood received in culture bottles   Culture   Final    NO GROWTH 3 DAYS Performed at [facility], [address] [place], [place] [address]    Report Status PENDING  Incomplete  Blood culture (routine x 2)     Status: None (Preliminary result)   Collection Time: 07/28/23  8:58 PM   Specimen: BLOOD RIGHT HAND  Result Value Ref Range Status   Specimen Description BLOOD RIGHT HAND  Final   Special Requests   Final    BOTTLES DRAWN AEROBIC AND ANAEROBIC Blood Culture results may not be optimal due to an excessive volume of blood received in culture bottles   Culture   Final    NO GROWTH 3 DAYS Performed at [facility], [address], [place], [place] [address]    Report Status PENDING  Incomplete  Culture, body fluid w Gram Stain-bottle     Status: None (Preliminary result)   Collection Time: 07/29/23  8:15 AM   Specimen: Peritoneal Washings  Result  Value Ref Range Status   Specimen Description  PERITONEAL  Final   Special Requests NONE  Final   Culture   Final    NO GROWTH 2 DAYS Performed at [facility], [address], [place], [place] [address]    Report Status PENDING  Incomplete  Gram stain     Status: None   Collection Time: 07/29/23  8:15 AM   Specimen: Peritoneal Washings  Result Value Ref Range Status   Specimen Description PERITONEAL  Final   Special Requests NONE  Final   Gram Stain   Final    WBC SEEN NO ORGANISMS SEEN CYTOSPIN SMEAR Performed at [facility], [address], [place], [place] [address]    Report Status 07/29/2023 FINAL  Final     Radiology Studies: MR ABDOMEN MRCP W WO CONTAST Result Date: 07/31/2023 CLINICAL DATA:  Pancreatitis. EXAM: MRI ABDOMEN WITHOUT AND WITH CONTRAST (INCLUDING MRCP) TECHNIQUE: Multiplanar multisequence MR imaging of the abdomen was performed both before and after the administration of intravenous contrast. Heavily T2-weighted images of the biliary and pancreatic ducts were obtained, and three-dimensional MRCP images were rendered by post processing. CONTRAST:  8mL GADAVIST  GADOBUTROL  1 MMOL/ML IV SOLN COMPARISON:  CT 07/28/2023 FINDINGS: Lower chest: No acute findings. Hepatobiliary: Marked diffuse hepatic steatosis. There is squaring of the caudate lobe and widening of the falciform ligament. Contour of the liver appears slightly irregular and nodular. No focal enhancing liver abnormality. Mild pericholecystic fluid noted, nonspecific in the setting of ascites. No gallstones identified. The proximal common bile duct measures up to 8 mm, image 28/4. No signs of choledocholithiasis. Pancreas: No main duct dilatation. Diffuse low attenuation edema involving the body and tail of pancreas with surrounding soft tissue stranding. Cystic lesion within the head of pancreas appears uniformly T2 hyperintense measuring 0.9 x 0.6 cm, image 24/5. Adjacent to the tail of pancreas there  is a small fluid collection measuring 1.7 x 1.1 cm, image 80/5. No signs pancreatic necrosis. No main duct dilatation or discrete mass. Spleen:  Within normal limits in size and appearance. Adrenals/Urinary Tract: Normal adrenal glands. Kidneys are unremarkable. Stomach/Bowel: Stomach appears within normal limits. No pathologic dilatation of the visualized bowel loops. Mild increase caliber scratch set mild wall thickening involving the visualized small bowel loops noted with wall thickness measuring up to 6 mm. Vascular/Lymphatic: Normal caliber abdominal aorta. The portal vein, portal venous confluence and SMV the are patent. 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 No abdominal adenopathy. Other: Large volume of ascites is identified within the abdomen. This ascites appears at least partially loculated with overlying enhancement of the peritoneal reflections, image 105/1304. Musculoskeletal: No suspicious bone lesions identified. IMPRESSION: 1. Imaging findings compatible with acute pancreatitis. No signs of pancreatic necrosis. 2. There are 2 fluid collections associated with the head and tail of pancreas. The largest is adjacent to the distal tail measuring 1.7 x 1.1 cm. Findings are favored to represent small pseudocysts. 3. Large volume of ascites is identified within the abdomen. This ascites appears at least partially loculated with overlying enhancement of the peritoneal reflections. Correlate for any clinical signs or symptoms of peritonitis. 4. Marked diffuse hepatic steatosis. There is squaring of the caudate lobe and widening of the falciform ligament. Contour of the liver appears slightly irregular and nodular. Findings are concerning for cirrhosis. 5. 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 6. Mild wall thickening involving  the visualized  small bowel loops noted with wall thickness measuring up to 6 mm. In the setting of cirrhosis and ascites is a nonspecific finding. Electronically Signed   By: Waddell Calk M.D.   On: 07/31/2023 09:23   MR 3D Recon At Scanner Result Date: 07/31/2023 CLINICAL DATA:  Pancreatitis. EXAM: MRI ABDOMEN WITHOUT AND WITH CONTRAST (INCLUDING MRCP) TECHNIQUE: Multiplanar multisequence MR imaging of the abdomen was performed both before and after the administration of intravenous contrast. Heavily T2-weighted images of the biliary and pancreatic ducts were obtained, and three-dimensional MRCP images were rendered by post processing. CONTRAST:  8mL GADAVIST  GADOBUTROL  1 MMOL/ML IV SOLN COMPARISON:  CT 07/28/2023 FINDINGS: Lower chest: No acute findings. Hepatobiliary: Marked diffuse hepatic steatosis. There is squaring of the caudate lobe and widening of the falciform ligament. Contour of the liver appears slightly irregular and nodular. No focal enhancing liver abnormality. Mild pericholecystic fluid noted, nonspecific in the setting of ascites. No gallstones identified. The proximal common bile duct measures up to 8 mm, image 28/4. No signs of choledocholithiasis. Pancreas: No main duct dilatation. Diffuse low attenuation edema involving the body and tail of pancreas with surrounding soft tissue stranding. Cystic lesion within the head of pancreas appears uniformly T2 hyperintense measuring 0.9 x 0.6 cm, image 24/5. Adjacent to the tail of pancreas there is a small fluid collection measuring 1.7 x 1.1 cm, image 80/5. No signs pancreatic necrosis. No main duct dilatation or discrete mass. Spleen:  Within normal limits in size and appearance. Adrenals/Urinary Tract: Normal adrenal glands. Kidneys are unremarkable. Stomach/Bowel: Stomach appears within normal limits. No pathologic dilatation of the visualized bowel loops. Mild increase caliber scratch set mild wall thickening involving the visualized small bowel loops noted  with wall thickness measuring up to 6 mm. Vascular/Lymphatic: Normal caliber abdominal aorta. The portal vein, portal venous confluence and SMV the are patent. 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 No abdominal adenopathy. Other: Large volume of ascites is identified within the abdomen. This ascites appears at least partially loculated with overlying enhancement of the peritoneal reflections, image 105/1304. Musculoskeletal: No suspicious bone lesions identified. IMPRESSION: 1. Imaging findings compatible with acute pancreatitis. No signs of pancreatic necrosis. 2. There are 2 fluid collections associated with the head and tail of pancreas. The largest is adjacent to the distal tail measuring 1.7 x 1.1 cm. Findings are favored to represent small pseudocysts. 3. Large volume of ascites is identified within the abdomen. This ascites appears at least partially loculated with overlying enhancement of the peritoneal reflections. Correlate for any clinical signs or symptoms of peritonitis. 4. Marked diffuse hepatic steatosis. There is squaring of the caudate lobe and widening of the falciform ligament. Contour of the liver appears slightly irregular and nodular. Findings are concerning for cirrhosis. 5. 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 6. Mild wall thickening involving the visualized small bowel loops noted with wall thickness measuring up to 6 mm. In the setting of cirrhosis and ascites is a nonspecific finding. Electronically Signed   By: Waddell Calk M.D.   On: 07/31/2023 09:23   ECHOCARDIOGRAM COMPLETE Result Date: 07/30/2023    ECHOCARDIOGRAM REPORT   Patient Name:   ORLEN LEEDY Date of Exam: 07/30/2023 Medical Rec #:  [id number]         Height:       73.0 in Accession #:  [id number]        Weight:       186.4 lb Date of Birth:   [birth year]         BSA:          2.088 m Patient Age:    42 years          BP:           123/94 mmHg Patient Gender: M                 HR:           103 bpm. Exam Location:  Inpatient Procedure: 2D Echo, Cardiac Doppler, Color Doppler and Intracardiac            Opacification Agent Indications:    Ascites  History:        Patient has no prior history of Echocardiogram examinations.                 Arrythmias:Tachycardia; Risk Factors:Hypertension.  Sonographer:    Lanell Maduro Referring Phys: Valrie Jia IMPRESSIONS  1. Left ventricular ejection fraction, by estimation, is 60 to 65%. The left ventricle has normal function. The left ventricle has no regional wall motion abnormalities. Left ventricular diastolic parameters were normal.  2. Right ventricular systolic function is normal. The right ventricular size is normal.  3. The mitral valve is normal in structure. No evidence of mitral valve regurgitation. No evidence of mitral stenosis.  4. The aortic valve is normal in structure. Aortic valve regurgitation is not visualized. No aortic stenosis is present.  5. The inferior vena cava is normal in size with greater than 50% respiratory variability, suggesting right atrial pressure of 3 mmHg. FINDINGS  Left Ventricle: Left ventricular ejection fraction, by estimation, is 60 to 65%. The left ventricle has normal function. The left ventricle has no regional wall motion abnormalities. Definity  contrast agent was given IV to delineate the left ventricular  endocardial borders. The left ventricular internal cavity size was normal in size. There is no left ventricular hypertrophy. Left ventricular diastolic parameters were normal. Right Ventricle: The right ventricular size is normal. No increase in right ventricular wall thickness. Right ventricular systolic function is normal. Left Atrium: Left atrial size was normal in size. Right Atrium: Right atrial size was normal in size. Pericardium: There is no evidence of  pericardial effusion. Mitral Valve: The mitral valve is normal in structure. No evidence of mitral valve regurgitation. No evidence of mitral valve stenosis. Tricuspid Valve: The tricuspid valve is normal in structure. Tricuspid valve regurgitation is not demonstrated. No evidence of tricuspid stenosis. Aortic Valve: The aortic valve is normal in structure. Aortic valve regurgitation is not visualized. No aortic stenosis is present. Pulmonic Valve: The pulmonic valve was normal in structure. Pulmonic valve regurgitation is not visualized. No evidence of pulmonic stenosis. Aorta: The aortic root is normal in size and structure. Venous: The inferior vena cava is normal in size with greater than 50% respiratory variability, suggesting right atrial pressure of 3 mmHg. IAS/Shunts: No atrial level shunt detected by color flow Doppler.  LEFT VENTRICLE PLAX 2D LVIDd:         4.60 cm     Diastology LVIDs:         2.50 cm     LV e' medial:    9.36 cm/s LV PW:         1.00 cm     LV E/e' medial:  7.3 LV IVS:  1.00 cm     LV e' lateral:   11.20 cm/s LVOT diam:     2.40 cm     LV E/e' lateral: 6.1 LV SV:         59 LV SV Index:   28 LVOT Area:     4.52 cm  LV Volumes (MOD) LV vol d, MOD A2C: 65.3 ml LV vol d, MOD A4C: 78.4 ml LV vol s, MOD A2C: 15.4 ml LV vol s, MOD A4C: 30.9 ml LV SV MOD A2C:     49.9 ml LV SV MOD A4C:     78.4 ml LV SV MOD BP:      49.6 ml RIGHT VENTRICLE RV Basal diam:  2.80 cm RV S prime:     11.70 cm/s TAPSE (M-mode): 2.4 cm LEFT ATRIUM             Index        RIGHT ATRIUM           Index LA diam:        3.40 cm 1.63 cm/m   RA Area:     10.20 cm LA Vol (A2C):   20.1 ml 9.63 ml/m   RA Volume:   19.00 ml  9.10 ml/m LA Vol (A4C):   33.6 ml 16.09 ml/m LA Biplane Vol: 28.1 ml 13.46 ml/m  AORTIC VALVE LVOT Vmax:   88.60 cm/s LVOT Vmean:  59.000 cm/s LVOT VTI:    0.130 m  AORTA Ao Root diam: 2.70 cm Ao Asc diam:  3.30 cm MITRAL VALVE MV Area (PHT): 4.60 cm    SHUNTS MV Decel Time: 165 msec     Systemic VTI:  0.13 m MV E velocity: 68.70 cm/s  Systemic Diam: 2.40 cm MV A velocity: 99.00 cm/s MV E/A ratio:  0.69 Aditya Sabharwal Electronically signed by Ria Commander Signature Date/Time: 07/30/2023/12:30:56 PM    Final     Scheduled Meds:  atenolol   25 mg Oral BID   folic acid   1 mg Oral Daily   lidocaine   1 patch Transdermal Q24H   multivitamin with minerals  1 tablet Oral Daily   nicotine   21 mg Transdermal Daily   potassium chloride   40 mEq Oral Q4H   thiamine   100 mg Oral Daily   Or   thiamine   100 mg Intravenous Daily   Continuous Infusions:   LOS: 2 days   Time spent: 40 minutes  Camellia Door, DO  [facility]  07/31/2023, 10:12 AM

## 2023-07-31 NOTE — Progress Notes (Signed)
   07/31/23 1544  Assess: MEWS Score  Temp 99.9 F (37.7 C)  BP 121/85  MAP (mmHg) 95  Pulse Rate (!) 115  Resp 16  SpO2 99 %  O2 Device Room Air  Assess: MEWS Score  MEWS Temp 0  MEWS Systolic 0  MEWS Pulse 2  MEWS RR 0  MEWS LOC 0  MEWS Score 2  MEWS Score Color Yellow  Assess: if the MEWS score is Yellow or Red  Were vital signs accurate and taken at a resting state? Yes  Does the patient meet 2 or more of the SIRS criteria? No  MEWS guidelines implemented  No, previously yellow, continue vital signs every 4 hours  Notify: Charge Nurse/RN  Name of Charge Nurse/RN Notified Kate Ada RN  Provider Notification  Provider Name/Title Camellia Door DO  Date Provider Notified 07/31/23  Time Provider Notified 1624  Method of Notification Page  Notification Reason Other (Comment) (HR elevated)  Provider response See new orders  Date of Provider Response 07/31/23  Time of Provider Response 1630  Assess: SIRS CRITERIA  SIRS Temperature  0  SIRS Respirations  0  SIRS Pulse 1  SIRS WBC 0  SIRS Score Sum  1

## 2023-08-01 ENCOUNTER — Inpatient Hospital Stay (HOSPITAL_COMMUNITY): Payer: 59

## 2023-08-01 DIAGNOSIS — E43 Unspecified severe protein-calorie malnutrition: Secondary | ICD-10-CM | POA: Insufficient documentation

## 2023-08-01 DIAGNOSIS — K852 Alcohol induced acute pancreatitis without necrosis or infection: Secondary | ICD-10-CM | POA: Diagnosis not present

## 2023-08-01 DIAGNOSIS — R188 Other ascites: Secondary | ICD-10-CM | POA: Diagnosis not present

## 2023-08-01 DIAGNOSIS — E440 Moderate protein-calorie malnutrition: Secondary | ICD-10-CM | POA: Insufficient documentation

## 2023-08-01 DIAGNOSIS — F1010 Alcohol abuse, uncomplicated: Secondary | ICD-10-CM | POA: Diagnosis not present

## 2023-08-01 DIAGNOSIS — I10 Essential (primary) hypertension: Secondary | ICD-10-CM | POA: Diagnosis not present

## 2023-08-01 DIAGNOSIS — K7031 Alcoholic cirrhosis of liver with ascites: Secondary | ICD-10-CM | POA: Diagnosis not present

## 2023-08-01 DIAGNOSIS — F1090 Alcohol use, unspecified, uncomplicated: Secondary | ICD-10-CM | POA: Diagnosis not present

## 2023-08-01 DIAGNOSIS — K859 Acute pancreatitis without necrosis or infection, unspecified: Secondary | ICD-10-CM | POA: Diagnosis not present

## 2023-08-01 HISTORY — PX: IR PARACENTESIS: IMG2679

## 2023-08-01 LAB — COMPREHENSIVE METABOLIC PANEL
ALT: 23 U/L (ref 0–44)
AST: 52 U/L — ABNORMAL HIGH (ref 15–41)
Albumin: 1.5 g/dL — ABNORMAL LOW (ref 3.5–5.0)
Alkaline Phosphatase: 205 U/L — ABNORMAL HIGH (ref 38–126)
Anion gap: 9 (ref 5–15)
BUN: 8 mg/dL (ref 6–20)
CO2: 25 mmol/L (ref 22–32)
Calcium: 7.3 mg/dL — ABNORMAL LOW (ref 8.9–10.3)
Chloride: 95 mmol/L — ABNORMAL LOW (ref 98–111)
Creatinine, Ser: 0.78 mg/dL (ref 0.61–1.24)
GFR, Estimated: >60 mL/min (ref >60)
Glucose, Bld: 107 mg/dL — ABNORMAL HIGH (ref 70–99)
Potassium: 4 mmol/L (ref 3.5–5.1)
Sodium: 129 mmol/L — ABNORMAL LOW (ref 135–145)
Total Bilirubin: 0.9 mg/dL (ref 0.0–1.2)
Total Protein: 5.2 g/dL — ABNORMAL LOW (ref 6.5–8.1)

## 2023-08-01 LAB — ANA: Anti Nuclear Antibody (ANA): NEGATIVE

## 2023-08-01 LAB — ALBUMIN, PLEURAL OR PERITONEAL FLUID: Albumin, Fluid: <1.5 g/dL

## 2023-08-01 LAB — GRAM STAIN: Gram Stain: NONE SEEN

## 2023-08-01 LAB — BODY FLUID CELL COUNT WITH DIFFERENTIAL
Eos, Fluid: 0 %
Lymphs, Fluid: 1 %
Monocyte-Macrophage-Serous Fluid: 95 % — ABNORMAL HIGH (ref 50–90)
Neutrophil Count, Fluid: 4 % (ref 0–25)
Total Nucleated Cell Count, Fluid: 280 uL (ref 0–1000)

## 2023-08-01 LAB — ANTI-SMOOTH MUSCLE ANTIBODY, IGG: F-Actin IgG: 4 U (ref 0–19)

## 2023-08-01 LAB — GLUCOSE, CAPILLARY: Glucose-Capillary: 90 mg/dL (ref 70–99)

## 2023-08-01 LAB — MITOCHONDRIAL ANTIBODIES: Mitochondrial M2 Ab, IgG: <20 U (ref 0.0–20.0)

## 2023-08-01 MED ORDER — PANCRELIPASE (LIP-PROT-AMYL) 12000-38000 UNITS PO CPEP
36000.0000 [IU] | ORAL_CAPSULE | Freq: Three times a day (TID) | ORAL | Status: DC
  Administered 2023-08-01 – 2023-08-05 (×12): 36000 [IU] via ORAL
  Filled 2023-08-01 (×12): qty 3

## 2023-08-01 MED ORDER — SPIRONOLACTONE 25 MG PO TABS
50.0000 mg | ORAL_TABLET | Freq: Every day | ORAL | Status: DC
  Administered 2023-08-01 – 2023-08-02 (×2): 50 mg via ORAL
  Filled 2023-08-01 (×2): qty 2

## 2023-08-01 MED ORDER — FUROSEMIDE 20 MG PO TABS
20.0000 mg | ORAL_TABLET | Freq: Every day | ORAL | Status: DC
  Administered 2023-08-01 – 2023-08-02 (×2): 20 mg via ORAL
  Filled 2023-08-01 (×2): qty 1

## 2023-08-01 MED ORDER — OCTREOTIDE ACETATE 100 MCG/ML IJ SOLN
100.0000 ug | Freq: Three times a day (TID) | INTRAMUSCULAR | Status: DC
  Administered 2023-08-01: 100 ug via SUBCUTANEOUS
  Filled 2023-08-01 (×3): qty 1

## 2023-08-01 MED ORDER — ENSURE ENLIVE PO LIQD
237.0000 mL | Freq: Three times a day (TID) | ORAL | Status: DC
  Administered 2023-08-01 – 2023-08-05 (×12): 237 mL via ORAL

## 2023-08-01 MED ORDER — OCTREOTIDE ACETATE 100 MCG/ML IJ SOLN
100.0000 ug | Freq: Three times a day (TID) | INTRAMUSCULAR | Status: DC
  Administered 2023-08-02 – 2023-08-05 (×11): 100 ug via SUBCUTANEOUS
  Filled 2023-08-01 (×13): qty 1

## 2023-08-01 MED ORDER — PANCRELIPASE (LIP-PROT-AMYL) 12000-38000 UNITS PO CPEP
36000.0000 [IU] | ORAL_CAPSULE | Freq: Two times a day (BID) | ORAL | Status: DC
  Administered 2023-08-02 – 2023-08-05 (×7): 36000 [IU] via ORAL
  Filled 2023-08-01 (×7): qty 3

## 2023-08-01 MED ORDER — CHLORDIAZEPOXIDE HCL 5 MG PO CAPS
10.0000 mg | ORAL_CAPSULE | Freq: Three times a day (TID) | ORAL | Status: DC | PRN
  Administered 2023-08-01 – 2023-08-02 (×3): 10 mg via ORAL
  Filled 2023-08-01 (×3): qty 2

## 2023-08-01 MED ORDER — ATENOLOL 25 MG PO TABS
37.5000 mg | ORAL_TABLET | Freq: Two times a day (BID) | ORAL | Status: DC
  Administered 2023-08-01 – 2023-08-05 (×8): 37.5 mg via ORAL
  Filled 2023-08-01 (×8): qty 2

## 2023-08-01 MED ORDER — LIDOCAINE HCL 1 % IJ SOLN
INTRAMUSCULAR | Status: AC
  Filled 2023-08-01: qty 20

## 2023-08-01 NOTE — Assessment & Plan Note (Signed)
 08/01/2023 per RD note from today. "Severe Malnutrition related to chronic illness as evidenced by severe muscle depletion, energy intake < or equal to 75% for > or equal to 1 month, mild fat depletion. "

## 2023-08-01 NOTE — Procedures (Signed)
 PROCEDURE SUMMARY:  Successful US  guided paracentesis from left lateral abdomen.  Yielded 7.0 liters of brown/orange fluid.  No immediate complications.  Pt tolerated well.   Specimen was sent for labs.  EBL < 5mL  Solmon Selmer Ku PA-C 08/01/2023 12:11 PM

## 2023-08-01 NOTE — Progress Notes (Addendum)
 Initial Nutrition Assessment  DOCUMENTATION CODES:   Severe malnutrition in context of chronic illness  INTERVENTION:   - Ensure Enlive po TID, each supplement provides 350 kcal and 20 grams of protein. - Double proteins with meals  - Snacks - Continue Thiamine , MVI, and folic acid  daily  - Discussed eating smaller more frequent meals throughout the day to help with early satiety. Emphasized the importance of protein.   NUTRITION DIAGNOSIS:   Severe Malnutrition related to chronic illness as evidenced by severe muscle depletion, energy intake < or equal to 75% for > or equal to 1 month, mild fat depletion.   GOAL:   Patient will meet greater than or equal to 90% of their needs   MONITOR:   PO intake, Weight trends, Supplement acceptance, Labs, I & O's  REASON FOR ASSESSMENT:   Consult Assessment of nutrition requirement/status  ASSESSMENT:  43 y.o. male with medical history significant of alcohol use disorder and hypertension presenting with abdominal and leg swelling for the past 3 weeks prior to admission. Found to have alcoholic fatty liver, ascites, alcohol induced acute pancreatitis, hypokalemia, and hyponatremia.  12/29 - Clear liquids 12/30 - Paracentesis, 4 L removed, Heart healthy diet 1/02 - NPO   Pt reports having poor appetite/PO intake for a while now which has worsened in the last 3 weeks PTA. He typically would only eat 2 meals a day. His first meal would be lunch and he would have 1/2 sandwich.Dinner would be his biggest meal where he would usually have a pre made meal consisting of a meat, starch, and vegetable. He would occasionally snack on fruit. Does drink about 1/5 liquor daily.   In the last 3 weeks he states he has been getting full fast. He is unsure of his usual body weight but he knows he has been losing weight. Pt appeared frail on visit. He reports eating good yesterday. Per meals documented he has been eating 100%. Pt placed NPO last night and  this morning/afternoon for paracentesis. Pt expressed he was hungry. MD wanted patient on high protein/salt diet after procedure.   Pt agreeable to double proteins and Ensure Enlive TID. If patient has poor PO intake will add Pro[employer] for extra protein. Discussed with patient ways to help with early satiety. Wanted to try snacks.  Weight trending down.   Admit weight: 89.7 kg  Current weight: 85 kg    Average Meal Intake: 12/30-12/31: 100% intake x 5 recorded meals  Nutritionally Relevant Medications: Scheduled Meds:  atenolol   37.5 mg Oral BID   feeding supplement  237 mL Oral TID BM   folic acid   1 mg Oral Daily   lidocaine   1 patch Transdermal Q24H   multivitamin with minerals  1 tablet Oral Daily   nicotine   21 mg Transdermal Daily   thiamine   100 mg Oral Daily   Or   thiamine   100 mg Intravenous Daily   Continuous Infusions:  albumin  human     Labs Reviewed: Sodium 129, Chloride 95, Calcium 7.3,  CBG ranges from 90-120 mg/dL over the last 24 hours  NUTRITION - FOCUSED PHYSICAL EXAM:  Flowsheet Row Most Recent Value  Orbital Region Mild depletion  Upper Arm Region Severe depletion  Thoracic and Lumbar Region Unable to assess  [Fluid]  Buccal Region Mild depletion  Temple Region Moderate depletion  Clavicle Bone Region Severe depletion  Clavicle and Acromion Bone Region Severe depletion  Scapular Bone Region Severe depletion  Dorsal Hand Moderate depletion  Patellar  Region Moderate depletion  Anterior Thigh Region Moderate depletion  Posterior Calf Region Moderate depletion  Edema (RD Assessment) Mild  [Abdomen]  Hair Reviewed  Eyes Reviewed  Mouth Reviewed  [Some teeth missing]  Skin Reviewed  Nails Reviewed       Diet Order:   Diet Order             Diet regular Room service appropriate? Yes; Fluid consistency: Thin  Diet effective now                   EDUCATION NEEDS:   Education needs have been addressed  Skin:  Skin Assessment: Skin  Integrity Issues: Skin Integrity Issues:: Incisions Incisions: Lower right flank  Last BM:  07/2023  Height:   Ht Readings from Last 1 Encounters:  07/28/23 6' 1 (1.854 m)    Weight:   Wt Readings from Last 1 Encounters:  08/01/23 85 kg    Ideal Body Weight:  83.64 kg  BMI:  Body mass index is 24.72 kg/m.  Estimated Nutritional Needs:   Kcal:  2400-2600 kcal  Protein:  120-140 gm  Fluid:  >2L  Olivia Kenning, RD Registered Dietitian  See Amion for more information

## 2023-08-01 NOTE — Progress Notes (Addendum)
 PROGRESS NOTE    [name]  FMW:[id number] DOB: [birth year] DOA: 07/28/2023 PCP: Valma Lannie LABOR, PA-C  Subjective: Pt seen and examined this AM.  Awaiting paracentesis today GI said pt needs screening EGD to evaluate for varices   Hospital Course: HPI: Juan Crawford is a 43 y.o. male with medical history significant of alcohol use disorder and hypertension presenting with abdominal and leg swelling for the past 3 weeks.   He reports that he first noticed abdominal swelling about 3 weeks ago.  His abdominal swelling progressively became worse over the past couple of weeks and he began developing bilateral leg swelling as well.  He came to the emergency department as his abdominal and leg swelling was not going away on its own.  He does report history of alcohol use, drinks about 1/5 of liquor daily.  His last drink was around 4 AM this morning.  He denies any nausea, vomiting, yellowing of his skin, chest pain, palpitations, shortness of breath, abdominal pain, diarrhea, constipation, urinary changes.  He denies any history of delirium tremens.   ED course: Initial vital signs with tachycardia and elevated BP, but he is afebrile and saturating well on room air.  CBC showing leukocytosis but otherwise with stable hemoglobin and platelet count.  CMP showing hyponatremia, hypokalemia, elevated AST and alk phos, hypoalbuminemia, normal kidney function.  PT mildly elevated with normal INR.  Alcohol level elevated.  Lipase elevated at 182.  Lactate 2.1.  Ammonia 41.  BNP normal.  CT abdomen pelvis showing mild pancreatitis, fatty infiltration with hepatomegaly, and significant ascites.  Right upper quadrant ultrasound showing fatty liver and moderate ascites.  ED provider has reached out to IR who will perform paracentesis tomorrow.  ED provider also consulted GI, Dr. Wilhelmenia, who will evaluate patient tomorrow.  [facility] hospitalist asked to evaluate patient for admission.    Significant  Events: Admitted 07/28/2023 for new onset ascites   Significant Labs: Lipase elevated at 182. Lactate 2.1. Ammonia 41, ETOH 180  Significant Imaging Studies: CT abdomen pelvis showing mild pancreatitis, fatty infiltration with hepatomegaly, and significant ascites.  Right upper quadrant ultrasound showing fatty liver and moderate ascites.   Antibiotic Therapy: Anti-infectives (From admission, onward)    None       Procedures: 07-29-2023 Paracentesis for 4 liters  Consultants: GI    Assessment and Plan: * Ascites 07-29-2023 s/p 4 liter paracentesis today. Ascites fluid does not meet criteria for SBP.  No abx are indicated at this time. 07-30-2023 GI continues workup for his ascites. May need to re-image in a few days to see if he needs another paracentesis. Discussed with pt that paracentesis would be repeated only if he develops difficulty with breathing or early satiety. 07-31-2023 pt with moderate amount of ascites again. Pt states abd distension is getting more uncomfortable again. Will ask IR for another large volume paracentesis tomorrow. 08-01-2023 for repeat paracentesis today. Large volume. Remove as much fluid as possible. RD consult to increase protein intake in his diet. Prn IV albumin  ordered after paracentesis. Defer to GI service to start aldactone  as they see fit.  Alcoholic cirrhosis of liver with ascites (HCC) 07-31-2023 his cirrhosis workup thus far has been unrevealing. 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  Defer to GI if he needs EGD to evaluate for portal gastropathy. Defer to GI if pt needs SBP prophylaxis, aldactone . Pt already on atenolol  for his HTN. Can change over  to coreg/nadolol/lopressor /propranolol if desired by GI. 08-01-2023 still somewhat tachy. BP has some room to increase atenolol . Will increase to 37.5 mg bid.    Alcohol-induced acute pancreatitis without infection or  necrosis 07-29-2023 only mild elevation of lipase. Stable. 07-30-2023 stable lipase. 07-31-2023 MRCP shows some cyst in his pancreas.  Alcohol use disorder 07-29-2023 continue with CIWA protocol. Pt admits to drinking 1/5 liquor per day. 07-30-2023 2 prn ativans given in the last 24 hours. 07-31-2023 2 prn ativans given since I last saw patient yesterday. Can stop CIWA soon. 08-01-2023 off CIWA protocol. Outside window now for Dts.  Protein-calorie malnutrition, severe 08/01/2023 per RD note from today. Severe Malnutrition related to chronic illness as evidenced by severe muscle depletion, energy intake < or equal to 75% for > or equal to 1 month, mild fat depletion.   Essential hypertension 07-29-2023 start atenolol  12.5 mg bid. 07-30-2023 BP still elevated. Will increase atenolol  to 25 mg bid. 07-31-2023 continue with current dose of atenolol  25 mg bid. 08-01-2023 increase atenolol  to 37.5 mg bid to help with HR 100s.  Hypokalemia 07-29-2023 replete with po kcl 07-30-2023 resolved. K of 3.6 07-31-2023 give more po kcl. 08-01-2023 resolved with replacement.  Hyponatremia 07-29-2023 likely due to alcohol intake and very little solute intake. Asymptomatic at this point. Follow with daily BMP. 07-30-2023 Na down to 127. Will check serum osm, urine osm, urine electrolytes. My intuition says that his hyponatremia is from low solute intake rather than SIADH. TSH is normal at 1.68. repeat BMP in AM. 07-31-2023 unfortunately, his urine electrolytes not colleted when his sodium was low. Now his Na is up to 133. He is eating better last 24 hours. All this points to low solute intake as cause of his hyponatremia and not volume overload or SIADH 08-01-2023 down today due to NPO for paracentesis. Due to low solute intake.  Tobacco abuse 07-31-2023 continue with nicotine  patch.   DVT prophylaxis: Place and maintain sequential compression device Start: 07/28/23 2017    Code Status: Full  Code Family Communication: no family at bedside Disposition Plan: return home Reason for continuing need for hospitalization: continued evaluation of his cirrhosis with ascites. Going for paracentesis today.  Objective: Vitals:   08/01/23 1139 08/01/23 1217 08/01/23 1230 08/01/23 1451  BP: 103/67 114/80 116/71 123/69  Pulse: (!) 106  (!) 101 (!) 113  Resp: 18   18  Temp: 98.9 F (37.2 C)   98.1 F (36.7 C)  TempSrc: Oral     SpO2: 98%   97%  Weight:      Height:        Intake/Output Summary (Last 24 hours) at 08/01/2023 1659 Last data filed at 08/01/2023 0900 Gross per 24 hour  Intake 360 ml  Output --  Net 360 ml   Filed Weights   07/30/23 0500 07/31/23 0500 08/01/23 0445  Weight: 84.6 kg 87.3 kg 85 kg    Examination:  Physical Exam Vitals and nursing note reviewed.  Constitutional:      General: He is not in acute distress.    Appearance: He is not toxic-appearing.  HENT:     Head: Normocephalic and atraumatic.     Nose: Nose normal.  Cardiovascular:     Rate and Rhythm: Normal rate and regular rhythm.  Pulmonary:     Effort: Pulmonary effort is normal.     Breath sounds: Normal breath sounds.  Abdominal:     General: There is distension.     Palpations: Abdomen is  soft. There is fluid wave.     Tenderness: There is no abdominal tenderness. There is no guarding or rebound.  Musculoskeletal:     Right lower leg: 1+ Pitting Edema present.     Left lower leg: 1+ Pitting Edema present.  Skin:    General: Skin is warm and dry.     Capillary Refill: Capillary refill takes less than 2 seconds.  Neurological:     General: No focal deficit present.     Mental Status: He is alert and oriented to person, place, and time.     Data Reviewed: I have personally reviewed following labs and imaging studies  CBC: Recent Labs  Lab 07/28/23 1135 07/29/23 0606 07/30/23 0344 07/31/23 0810  WBC 12.7* 12.8* 11.6* 9.9  NEUTROABS 10.1*  --   --   --   HGB 13.3 11.7*  10.5* 11.2*  HCT 38.5* 34.1* 31.7* 33.5*  MCV 92.1 90.2 92.7 92.8  PLT 326 273 296 361   Basic Metabolic Panel: Recent Labs  Lab 07/28/23 1629 07/28/23 2058 07/29/23 0606 07/30/23 0344 07/31/23 0810 08/01/23 0248  NA  --  129* 129* 127* 133* 129*  K  --  3.1* 3.3* 3.6 3.4* 4.0  CL  --  89* 88* 92* 96* 95*  CO2  --  26 27 27 27 25   GLUCOSE  --  103* 169* 128* 114* 107*  BUN  --  6 6 5* 8 8  CREATININE  --  0.72 0.70 0.71 0.76 0.78  CALCIUM  --  7.4* 7.5* 7.4* 7.5* 7.3*  MG 1.8  --   --   --   --   --    GFR: Estimated Creatinine Clearance: 137.3 mL/min (by C-G formula based on SCr of 0.78 mg/dL). Liver Function Tests: Recent Labs  Lab 07/28/23 2058 07/29/23 0606 07/30/23 0344 07/31/23 0810 08/01/23 0248  AST 62* 54* 38 45* 52*  ALT 25 24 20 22 23   ALKPHOS 330* 326* 238* 235* 205*  BILITOT 1.2* 1.0 1.1 1.1 0.9  PROT 5.8* 5.5* 4.9* 5.1* 5.2*  ALBUMIN  1.9* 1.9* 1.5* 1.6* 1.5*   Recent Labs  Lab 07/28/23 1629 07/30/23 0344  LIPASE 176*  182* 164*   Recent Labs  Lab 07/28/23 1629  AMMONIA 41*   Coagulation Profile: Recent Labs  Lab 07/28/23 1629 07/29/23 0606 07/31/23 0810  INR 1.2 1.3* 1.4*    BNP (last 3 results) Recent Labs    07/28/23 1629  BNP 23.4   CBG: Recent Labs  Lab 07/30/23 0746 08/01/23 0827  GLUCAP 120* 90   Anemia Panel: No results for input(s): VITAMINB12, FOLATE, FERRITIN, TIBC, IRON, RETICCTPCT in the last 72 hours.  Sepsis Labs: Recent Labs  Lab 07/28/23 1710  LATICACIDVEN 2.3*    Recent Results (from the past 240 hours)  Blood culture (routine x 2)     Status: None (Preliminary result)   Collection Time: 07/28/23  3:25 PM   Specimen: BLOOD  Result Value Ref Range Status   Specimen Description BLOOD SITE NOT SPECIFIED  Final   Special Requests   Final    BOTTLES DRAWN AEROBIC AND ANAEROBIC Blood Culture results may not be optimal due to an inadequate volume of blood received in culture bottles    Culture   Final    NO GROWTH 4 DAYS Performed at [facility], [address] [place], [place] [address]    Report Status PENDING  Incomplete  Blood culture (routine x 2)  Status: None (Preliminary result)   Collection Time: 07/28/23  8:58 PM   Specimen: BLOOD RIGHT HAND  Result Value Ref Range Status   Specimen Description BLOOD RIGHT HAND  Final   Special Requests   Final    BOTTLES DRAWN AEROBIC AND ANAEROBIC Blood Culture results may not be optimal due to an excessive volume of blood received in culture bottles   Culture   Final    NO GROWTH 4 DAYS Performed at [facility], [address] [place], [place] [address]    Report Status PENDING  Incomplete  Culture, body fluid w Gram Stain-bottle     Status: None (Preliminary result)   Collection Time: 07/29/23  8:15 AM   Specimen: Peritoneal Washings  Result Value Ref Range Status   Specimen Description PERITONEAL  Final   Special Requests NONE  Final   Culture   Final    NO GROWTH 3 DAYS Performed at [facility], [address], [place], [place] [address]    Report Status PENDING  Incomplete  Gram stain     Status: None   Collection Time: 07/29/23  8:15 AM   Specimen: Peritoneal Washings  Result Value Ref Range Status   Specimen Description PERITONEAL  Final   Special Requests NONE  Final   Gram Stain   Final    WBC SEEN NO ORGANISMS SEEN CYTOSPIN SMEAR Performed at [facility], [address] [place], [place] [address]    Report Status 07/29/2023 FINAL  Final  Gram stain     Status: None   Collection Time: 08/01/23 12:49 PM   Specimen: Peritoneal Washings  Result Value Ref Range Status   Specimen Description PERITONEAL  Final   Special Requests NONE  Final   Gram Stain   Final    NO WBC SEEN NO ORGANISMS SEEN Performed at [facility], [address] [place], [place] [address]    Report Status 08/01/2023 FINAL  Final     Radiology Studies: IR Paracentesis Result Date:  08/01/2023 INDICATION: 43 year old male with abdominal distention, ascites. Request made for diagnostic and therapeutic paracentesis. EXAM: ULTRASOUND GUIDED DIAGNOSTIC AND THERAPEUTIC PARACENTESIS MEDICATIONS: 10 mL 1% lidocaine  COMPLICATIONS: None immediate. PROCEDURE: Informed written consent was obtained from the patient after a discussion of the risks, benefits and alternatives to treatment. A timeout was performed prior to the initiation of the procedure. Initial ultrasound scanning demonstrates a large amount of ascites within the left lateral abdomen. The left lateral abdomen was prepped and draped in the usual sterile fashion. 1% lidocaine  was used for local anesthesia. Following this, a 19 gauge, 7-cm, Yueh catheter was introduced. An ultrasound image was saved for documentation purposes. The paracentesis was performed. The catheter was removed and a dressing was applied. The patient tolerated the procedure well without immediate post procedural complication. FINDINGS: A total of approximately 7.0 liters of brown/orange fluid was removed. Samples were sent to the laboratory as requested by the clinical team. IMPRESSION: Successful ultrasound-guided paracentesis yielding 7.0 liters of peritoneal fluid. Performed by: Kacie Matthews PA-C Electronically Signed   By: Ester Sides M.D.   On: 08/01/2023 15:29   MR ABDOMEN MRCP W WO CONTAST Result Date: 07/31/2023 CLINICAL DATA:  Pancreatitis. EXAM: MRI ABDOMEN WITHOUT AND WITH CONTRAST (INCLUDING MRCP) TECHNIQUE: Multiplanar multisequence MR imaging of the abdomen was performed both before and after the administration of intravenous contrast. Heavily T2-weighted images of the biliary and pancreatic ducts were obtained, and  three-dimensional MRCP images were rendered by post processing. CONTRAST:  8mL GADAVIST  GADOBUTROL  1 MMOL/ML IV SOLN COMPARISON:  CT 07/28/2023 FINDINGS: Lower chest: No acute findings. Hepatobiliary: Marked diffuse hepatic steatosis. There  is squaring of the caudate lobe and widening of the falciform ligament. Contour of the liver appears slightly irregular and nodular. No focal enhancing liver abnormality. Mild pericholecystic fluid noted, nonspecific in the setting of ascites. No gallstones identified. The proximal common bile duct measures up to 8 mm, image 28/4. No signs of choledocholithiasis. Pancreas: No main duct dilatation. Diffuse low attenuation edema involving the body and tail of pancreas with surrounding soft tissue stranding. Cystic lesion within the head of pancreas appears uniformly T2 hyperintense measuring 0.9 x 0.6 cm, image 24/5. Adjacent to the tail of pancreas there is a small fluid collection measuring 1.7 x 1.1 cm, image 80/5. No signs pancreatic necrosis. No main duct dilatation or discrete mass. Spleen:  Within normal limits in size and appearance. Adrenals/Urinary Tract: Normal adrenal glands. Kidneys are unremarkable. Stomach/Bowel: Stomach appears within normal limits. No pathologic dilatation of the visualized bowel loops. Mild increase caliber scratch set mild wall thickening involving the visualized small bowel loops noted with wall thickness measuring up to 6 mm. Vascular/Lymphatic: Normal caliber abdominal aorta. The portal vein, portal venous confluence and SMV the are patent. 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 No abdominal adenopathy. Other: Large volume of ascites is identified within the abdomen. This ascites appears at least partially loculated with overlying enhancement of the peritoneal reflections, image 105/1304. Musculoskeletal: No suspicious bone lesions identified. IMPRESSION: 1. Imaging findings compatible with acute pancreatitis. No signs of pancreatic necrosis. 2. There are 2 fluid collections associated with the head and tail of pancreas. The largest is adjacent to the distal tail measuring 1.7 x 1.1 cm.  Findings are favored to represent small pseudocysts. 3. Large volume of ascites is identified within the abdomen. This ascites appears at least partially loculated with overlying enhancement of the peritoneal reflections. Correlate for any clinical signs or symptoms of peritonitis. 4. Marked diffuse hepatic steatosis. There is squaring of the caudate lobe and widening of the falciform ligament. Contour of the liver appears slightly irregular and nodular. Findings are concerning for cirrhosis. 5. 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 6. Mild wall thickening involving the visualized small bowel loops noted with wall thickness measuring up to 6 mm. In the setting of cirrhosis and ascites is a nonspecific finding. Electronically Signed   By: Waddell Calk M.D.   On: 07/31/2023 09:23   MR 3D Recon At Scanner Result Date: 07/31/2023 CLINICAL DATA:  Pancreatitis. EXAM: MRI ABDOMEN WITHOUT AND WITH CONTRAST (INCLUDING MRCP) TECHNIQUE: Multiplanar multisequence MR imaging of the abdomen was performed both before and after the administration of intravenous contrast. Heavily T2-weighted images of the biliary and pancreatic ducts were obtained, and three-dimensional MRCP images were rendered by post processing. CONTRAST:  8mL GADAVIST  GADOBUTROL  1 MMOL/ML IV SOLN COMPARISON:  CT 07/28/2023 FINDINGS: Lower chest: No acute findings. Hepatobiliary: Marked diffuse hepatic steatosis. There is squaring of the caudate lobe and widening of the falciform ligament. Contour of the liver appears slightly irregular and nodular. No focal enhancing liver abnormality. Mild pericholecystic fluid noted, nonspecific in the setting of ascites. No gallstones identified. The proximal common bile duct measures up to 8 mm, image 28/4. No signs of choledocholithiasis. Pancreas: No  main duct dilatation. Diffuse low attenuation edema involving the body and tail of pancreas  with surrounding soft tissue stranding. Cystic lesion within the head of pancreas appears uniformly T2 hyperintense measuring 0.9 x 0.6 cm, image 24/5. Adjacent to the tail of pancreas there is a small fluid collection measuring 1.7 x 1.1 cm, image 80/5. No signs pancreatic necrosis. No main duct dilatation or discrete mass. Spleen:  Within normal limits in size and appearance. Adrenals/Urinary Tract: Normal adrenal glands. Kidneys are unremarkable. Stomach/Bowel: Stomach appears within normal limits. No pathologic dilatation of the visualized bowel loops. Mild increase caliber scratch set mild wall thickening involving the visualized small bowel loops noted with wall thickness measuring up to 6 mm. Vascular/Lymphatic: Normal caliber abdominal aorta. The portal vein, portal venous confluence and SMV the are patent. 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 No abdominal adenopathy. Other: Large volume of ascites is identified within the abdomen. This ascites appears at least partially loculated with overlying enhancement of the peritoneal reflections, image 105/1304. Musculoskeletal: No suspicious bone lesions identified. IMPRESSION: 1. Imaging findings compatible with acute pancreatitis. No signs of pancreatic necrosis. 2. There are 2 fluid collections associated with the head and tail of pancreas. The largest is adjacent to the distal tail measuring 1.7 x 1.1 cm. Findings are favored to represent small pseudocysts. 3. Large volume of ascites is identified within the abdomen. This ascites appears at least partially loculated with overlying enhancement of the peritoneal reflections. Correlate for any clinical signs or symptoms of peritonitis. 4. Marked diffuse hepatic steatosis. There is squaring of the caudate lobe and widening of the falciform ligament. Contour of the liver appears slightly irregular and nodular. Findings are  concerning for cirrhosis. 5. 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 6. Mild wall thickening involving the visualized small bowel loops noted with wall thickness measuring up to 6 mm. In the setting of cirrhosis and ascites is a nonspecific finding. Electronically Signed   By: Waddell Calk M.D.   On: 07/31/2023 09:23    Scheduled Meds:  atenolol   37.5 mg Oral BID   feeding supplement  237 mL Oral TID BM   folic acid   1 mg Oral Daily   furosemide   20 mg Oral Daily   lidocaine   1 patch Transdermal Q24H   lipase/protease/amylase  36,000 Units Oral TID WC   [START ON 08/02/2023] lipase/protease/amylase  36,000 Units Oral BID BM   multivitamin with minerals  1 tablet Oral Daily   nicotine   21 mg Transdermal Daily   octreotide   100 mcg Subcutaneous Q8H   spironolactone   50 mg Oral Daily   thiamine   100 mg Oral Daily   Or   thiamine   100 mg Intravenous Daily   Continuous Infusions:  albumin  human 60 mL/hr at 08/01/23 1403     LOS: 3 days   Time spent: 40 minutes  Camellia Door, DO  [facility]  08/01/2023, 4:59 PM

## 2023-08-01 NOTE — Progress Notes (Signed)
 Progress Note   Subjective  Patient had paracentesis today. Tolerating diet, hungry. It not having much pain. Feels better following paracentesis. Multiple family members in room.   Objective   Vital signs in last 24 hours: Temp:  [98.1 F (36.7 C)-99 F (37.2 C)] 98.1 F (36.7 C) (01/02 1451) Pulse Rate:  [101-113] 113 (01/02 1451) Resp:  [17-20] 18 (01/02 1451) BP: (103-123)/(67-88) 123/69 (01/02 1451) SpO2:  [89 %-100 %] 97 % (01/02 1451) Weight:  [85 kg] 85 kg (01/02 0445) Last BM Date : 07/31/23 General:    white male in NAD Abdomen:  Soft, somewhat distended.  Neurologic:  Alert and oriented,  grossly normal neurologically. Psych:  Cooperative. Normal mood and affect.  Intake/Output from previous day: 01/01 0701 - 01/02 0700 In: 360 [P.O.:360] Out: -  Intake/Output this shift: No intake/output data recorded.  Lab Results: Recent Labs    07/30/23 0344 07/31/23 0810  WBC 11.6* 9.9  HGB 10.5* 11.2*  HCT 31.7* 33.5*  PLT 296 361   BMET Recent Labs    07/30/23 0344 07/31/23 0810 08/01/23 0248  NA 127* 133* 129*  K 3.6 3.4* 4.0  CL 92* 96* 95*  CO2 27 27 25   GLUCOSE 128* 114* 107*  BUN 5* 8 8  CREATININE 0.71 0.76 0.78  CALCIUM 7.4* 7.5* 7.3*   LFT Recent Labs    08/01/23 0248  PROT 5.2*  ALBUMIN  1.5*  AST 52*  ALT 23  ALKPHOS 205*  BILITOT 0.9   PT/INR Recent Labs    07/31/23 0810  LABPROT 17.2*  INR 1.4*    Studies/Results: IR Paracentesis Result Date: 08/01/2023 INDICATION: 43 year old male with abdominal distention, ascites. Request made for diagnostic and therapeutic paracentesis. EXAM: ULTRASOUND GUIDED DIAGNOSTIC AND THERAPEUTIC PARACENTESIS MEDICATIONS: 10 mL 1% lidocaine  COMPLICATIONS: None immediate. PROCEDURE: Informed written consent was obtained from the patient after a discussion of the risks, benefits and alternatives to treatment. A timeout was performed prior to the initiation of the procedure. Initial ultrasound  scanning demonstrates a large amount of ascites within the left lateral abdomen. The left lateral abdomen was prepped and draped in the usual sterile fashion. 1% lidocaine  was used for local anesthesia. Following this, a 19 gauge, 7-cm, Yueh catheter was introduced. An ultrasound image was saved for documentation purposes. The paracentesis was performed. The catheter was removed and a dressing was applied. The patient tolerated the procedure well without immediate post procedural complication. FINDINGS: A total of approximately 7.0 liters of brown/orange fluid was removed. Samples were sent to the laboratory as requested by the clinical team. IMPRESSION: Successful ultrasound-guided paracentesis yielding 7.0 liters of peritoneal fluid. Performed by: Kacie Matthews PA-C Electronically Signed   By: Ester Sides M.D.   On: 08/01/2023 15:29   MR ABDOMEN MRCP W WO CONTAST Result Date: 07/31/2023 CLINICAL DATA:  Pancreatitis. EXAM: MRI ABDOMEN WITHOUT AND WITH CONTRAST (INCLUDING MRCP) TECHNIQUE: Multiplanar multisequence MR imaging of the abdomen was performed both before and after the administration of intravenous contrast. Heavily T2-weighted images of the biliary and pancreatic ducts were obtained, and three-dimensional MRCP images were rendered by post processing. CONTRAST:  8mL GADAVIST  GADOBUTROL  1 MMOL/ML IV SOLN COMPARISON:  CT 07/28/2023 FINDINGS: Lower chest: No acute findings. Hepatobiliary: Marked diffuse hepatic steatosis. There is squaring of the caudate lobe and widening of the falciform ligament. Contour of the liver appears slightly irregular and nodular. No focal enhancing liver abnormality. Mild pericholecystic fluid noted, nonspecific in the setting of  ascites. No gallstones identified. The proximal common bile duct measures up to 8 mm, image 28/4. No signs of choledocholithiasis. Pancreas: No main duct dilatation. Diffuse low attenuation edema involving the body and tail of pancreas with  surrounding soft tissue stranding. Cystic lesion within the head of pancreas appears uniformly T2 hyperintense measuring 0.9 x 0.6 cm, image 24/5. Adjacent to the tail of pancreas there is a small fluid collection measuring 1.7 x 1.1 cm, image 80/5. No signs pancreatic necrosis. No main duct dilatation or discrete mass. Spleen:  Within normal limits in size and appearance. Adrenals/Urinary Tract: Normal adrenal glands. Kidneys are unremarkable. Stomach/Bowel: Stomach appears within normal limits. No pathologic dilatation of the visualized bowel loops. Mild increase caliber scratch set mild wall thickening involving the visualized small bowel loops noted with wall thickness measuring up to 6 mm. Vascular/Lymphatic: Normal caliber abdominal aorta. The portal vein, portal venous confluence and SMV the are patent. 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 No abdominal adenopathy. Other: Large volume of ascites is identified within the abdomen. This ascites appears at least partially loculated with overlying enhancement of the peritoneal reflections, image 105/1304. Musculoskeletal: No suspicious bone lesions identified. IMPRESSION: 1. Imaging findings compatible with acute pancreatitis. No signs of pancreatic necrosis. 2. There are 2 fluid collections associated with the head and tail of pancreas. The largest is adjacent to the distal tail measuring 1.7 x 1.1 cm. Findings are favored to represent small pseudocysts. 3. Large volume of ascites is identified within the abdomen. This ascites appears at least partially loculated with overlying enhancement of the peritoneal reflections. Correlate for any clinical signs or symptoms of peritonitis. 4. Marked diffuse hepatic steatosis. There is squaring of the caudate lobe and widening of the falciform ligament. Contour of the liver appears slightly irregular and nodular. Findings are  concerning for cirrhosis. 5. 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 6. Mild wall thickening involving the visualized small bowel loops noted with wall thickness measuring up to 6 mm. In the setting of cirrhosis and ascites is a nonspecific finding. Electronically Signed   By: Waddell Calk M.D.   On: 07/31/2023 09:23   MR 3D Recon At Scanner Result Date: 07/31/2023 CLINICAL DATA:  Pancreatitis. EXAM: MRI ABDOMEN WITHOUT AND WITH CONTRAST (INCLUDING MRCP) TECHNIQUE: Multiplanar multisequence MR imaging of the abdomen was performed both before and after the administration of intravenous contrast. Heavily T2-weighted images of the biliary and pancreatic ducts were obtained, and three-dimensional MRCP images were rendered by post processing. CONTRAST:  8mL GADAVIST  GADOBUTROL  1 MMOL/ML IV SOLN COMPARISON:  CT 07/28/2023 FINDINGS: Lower chest: No acute findings. Hepatobiliary: Marked diffuse hepatic steatosis. There is squaring of the caudate lobe and widening of the falciform ligament. Contour of the liver appears slightly irregular and nodular. No focal enhancing liver abnormality. Mild pericholecystic fluid noted, nonspecific in the setting of ascites. No gallstones identified. The proximal common bile duct measures up to 8 mm, image 28/4. No signs of choledocholithiasis. Pancreas: No main duct dilatation. Diffuse low attenuation edema involving the body and tail of pancreas with surrounding soft tissue stranding. Cystic lesion within the head of pancreas appears uniformly T2 hyperintense measuring 0.9 x 0.6 cm, image 24/5. Adjacent to the tail of pancreas there is a small fluid collection measuring 1.7 x 1.1 cm, image 80/5. No signs pancreatic necrosis. No main duct dilatation or discrete mass. Spleen:  Within normal limits in size and appearance. Adrenals/Urinary Tract: Normal adrenal glands. Kidneys are unremarkable. Stomach/Bowel:  Stomach appears within normal limits. No pathologic dilatation of the visualized bowel loops. Mild increase caliber scratch set mild wall thickening involving the visualized small bowel loops noted with wall thickness measuring up to 6 mm. Vascular/Lymphatic: Normal caliber abdominal aorta. The portal vein, portal venous confluence and SMV the are patent. 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 No abdominal adenopathy. Other: Large volume of ascites is identified within the abdomen. This ascites appears at least partially loculated with overlying enhancement of the peritoneal reflections, image 105/1304. Musculoskeletal: No suspicious bone lesions identified. IMPRESSION: 1. Imaging findings compatible with acute pancreatitis. No signs of pancreatic necrosis. 2. There are 2 fluid collections associated with the head and tail of pancreas. The largest is adjacent to the distal tail measuring 1.7 x 1.1 cm. Findings are favored to represent small pseudocysts. 3. Large volume of ascites is identified within the abdomen. This ascites appears at least partially loculated with overlying enhancement of the peritoneal reflections. Correlate for any clinical signs or symptoms of peritonitis. 4. Marked diffuse hepatic steatosis. There is squaring of the caudate lobe and widening of the falciform ligament. Contour of the liver appears slightly irregular and nodular. Findings are concerning for cirrhosis. 5. 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 6. Mild wall thickening involving the visualized small bowel loops noted with wall thickness measuring up to 6 mm. In the setting of cirrhosis and ascites is a nonspecific finding. Electronically Signed   By: Waddell Calk M.D.   On: 07/31/2023 09:23       Assessment / Plan:    43 y/o male here with the  following:  Ascites Pancreatitis Alcohol abuse Possible alcoholic hepatitis  MRCP shows pancreatitis and some pseudocysts. PD seems normal, no obvious duct disruption although MRCP cannot rule that out. No mass lesions or stones in the ducts. He had another paracentesis of 7L removed today. Amylase lipase was NOT sent for analysis, we have asked the lab to add that back on. Cytology pending.   Given markedly elevated amylase on the last paracentesis there is concern for pancreatic ascites, which is not common. Usually total protein is high in this circumstance, his is low, but this remains possible or ascites is from combination of pancreatitis + alcoholic hepatitis. Repeat fluid analysis will be helpful to further clarify etiology.  Clinically he is not acting like any severe pancreatitis. He is tolerating a diet and not having much pain, only tightness from ascites. Will continue his diet in this light, his nutritional status is quite poor. While we are awaiting repeat fluid analysis I will start him empirically on octreotide  in case this is pancreatic ascites, and start him on some diuretics to see if that will help as well. I will also add some Creon . If this is pancreatic ascites, one could also consider ERCP for PD stenting, however that could have risks of making acute pancreatitis worse. I have spoken with my advanced endoscopy colleagues about his case, they agree to await further workup and his course prior to consideration for ERCP. Alcohol abstinence moving forward is critical.   He understands the plan for now, we will reassess him tomorrow.  PLAN: - have asked the lab to add on amylase / lipase to peritoneal fluid sample from today - continue diet  for now - start lasix  20mg  / day and aldactone  50mg  / day and titrate up as renal function / electrolytes allow - start Creon  - start empiric octreotide  for possible pancreatic ascites  Call with questions, will follow.  Marcey Naval, MD [facility]

## 2023-08-01 NOTE — Plan of Care (Signed)
 ?  Problem: Clinical Measurements: ?Goal: Ability to maintain clinical measurements within normal limits will improve ?Outcome: Progressing ?Goal: Will remain free from infection ?Outcome: Progressing ?Goal: Diagnostic test results will improve ?Outcome: Progressing ?  ?

## 2023-08-02 DIAGNOSIS — K859 Acute pancreatitis without necrosis or infection, unspecified: Secondary | ICD-10-CM | POA: Diagnosis not present

## 2023-08-02 DIAGNOSIS — I10 Essential (primary) hypertension: Secondary | ICD-10-CM | POA: Diagnosis not present

## 2023-08-02 DIAGNOSIS — F1010 Alcohol abuse, uncomplicated: Secondary | ICD-10-CM | POA: Diagnosis not present

## 2023-08-02 DIAGNOSIS — R188 Other ascites: Secondary | ICD-10-CM | POA: Diagnosis not present

## 2023-08-02 DIAGNOSIS — E43 Unspecified severe protein-calorie malnutrition: Secondary | ICD-10-CM

## 2023-08-02 DIAGNOSIS — K7031 Alcoholic cirrhosis of liver with ascites: Secondary | ICD-10-CM | POA: Diagnosis not present

## 2023-08-02 DIAGNOSIS — F1090 Alcohol use, unspecified, uncomplicated: Secondary | ICD-10-CM | POA: Diagnosis not present

## 2023-08-02 DIAGNOSIS — K852 Alcohol induced acute pancreatitis without necrosis or infection: Secondary | ICD-10-CM | POA: Diagnosis not present

## 2023-08-02 LAB — COMPREHENSIVE METABOLIC PANEL
ALT: 17 U/L (ref 0–44)
AST: 29 U/L (ref 15–41)
Albumin: 1.9 g/dL — ABNORMAL LOW (ref 3.5–5.0)
Alkaline Phosphatase: 153 U/L — ABNORMAL HIGH (ref 38–126)
Anion gap: 12 (ref 5–15)
BUN: 9 mg/dL (ref 6–20)
CO2: 24 mmol/L (ref 22–32)
Calcium: 7.8 mg/dL — ABNORMAL LOW (ref 8.9–10.3)
Chloride: 93 mmol/L — ABNORMAL LOW (ref 98–111)
Creatinine, Ser: 0.72 mg/dL (ref 0.61–1.24)
GFR, Estimated: >60 mL/min (ref >60)
Glucose, Bld: 144 mg/dL — ABNORMAL HIGH (ref 70–99)
Potassium: 4.4 mmol/L (ref 3.5–5.1)
Sodium: 129 mmol/L — ABNORMAL LOW (ref 135–145)
Total Bilirubin: 0.9 mg/dL (ref 0.0–1.2)
Total Protein: 5.1 g/dL — ABNORMAL LOW (ref 6.5–8.1)

## 2023-08-02 LAB — CYTOLOGY - NON PAP

## 2023-08-02 LAB — CULTURE, BLOOD (ROUTINE X 2)
Culture: NO GROWTH
Culture: NO GROWTH

## 2023-08-02 LAB — AMYLASE, BODY FLUID (OTHER): Amylase, Body Fluid: 4478 U/L

## 2023-08-02 LAB — LIPASE, FLUID: Lipase-Fluid: 25473 U/L

## 2023-08-02 LAB — GLUCOSE, CAPILLARY: Glucose-Capillary: 137 mg/dL — ABNORMAL HIGH (ref 70–99)

## 2023-08-02 MED ORDER — FUROSEMIDE 40 MG PO TABS
40.0000 mg | ORAL_TABLET | Freq: Every day | ORAL | Status: DC
  Administered 2023-08-03 – 2023-08-04 (×2): 40 mg via ORAL
  Filled 2023-08-02 (×2): qty 1

## 2023-08-02 MED ORDER — FUROSEMIDE 20 MG PO TABS
20.0000 mg | ORAL_TABLET | Freq: Once | ORAL | Status: AC
  Administered 2023-08-02: 20 mg via ORAL
  Filled 2023-08-02: qty 1

## 2023-08-02 MED ORDER — SPIRONOLACTONE 25 MG PO TABS
100.0000 mg | ORAL_TABLET | Freq: Every day | ORAL | Status: DC
  Administered 2023-08-03 – 2023-08-05 (×3): 100 mg via ORAL
  Filled 2023-08-02 (×3): qty 4

## 2023-08-02 MED ORDER — SPIRONOLACTONE 25 MG PO TABS
50.0000 mg | ORAL_TABLET | Freq: Once | ORAL | Status: AC
  Administered 2023-08-02: 50 mg via ORAL
  Filled 2023-08-02: qty 2

## 2023-08-02 NOTE — Plan of Care (Signed)
  Problem: Clinical Measurements: Goal: Ability to maintain clinical measurements within normal limits will improve Outcome: Progressing Goal: Will remain free from infection Outcome: Progressing   Problem: Activity: Goal: Risk for activity intolerance will decrease Outcome: Progressing   Problem: Nutrition: Goal: Adequate nutrition will be maintained Outcome: Progressing   Problem: Elimination: Goal: Will not experience complications related to bowel motility Outcome: Progressing Goal: Will not experience complications related to urinary retention Outcome: Progressing   Problem: Pain Management: Goal: General experience of comfort will improve Outcome: Progressing   Problem: Safety: Goal: Ability to remain free from injury will improve Outcome: Progressing   Problem: Skin Integrity: Goal: Risk for impaired skin integrity will decrease Outcome: Progressing

## 2023-08-02 NOTE — Progress Notes (Addendum)
 Progress Note   Subjective  Patient states he is feeling better today. Appears to be tolerating diet well, trying to eat proteins. Peritoneal fluid analysis remains pending.    Objective   Vital signs in last 24 hours: Temp:  [98.1 F (36.7 C)-99.6 F (37.6 C)] 98.3 F (36.8 C) (01/03 0809) Pulse Rate:  [100-114] 104 (01/03 0809) Resp:  [16-19] 18 (01/03 0809) BP: (103-123)/(67-80) 109/72 (01/03 0809) SpO2:  [94 %-98 %] 98 % (01/03 0809) Weight:  [76.9 kg] 76.9 kg (01/03 0500) Last BM Date : 07/31/23 General:    white male in NAD Neurologic:  Alert and oriented,  grossly normal neurologically. Psych:  Cooperative. Normal mood and affect.  Intake/Output from previous day: 01/02 0701 - 01/03 0700 In: 910 [P.O.:810; IV Piggyback:100] Out: -  Intake/Output this shift: No intake/output data recorded.  Lab Results: Recent Labs    07/31/23 0810  WBC 9.9  HGB 11.2*  HCT 33.5*  PLT 361   BMET Recent Labs    07/31/23 0810 08/01/23 0248 08/02/23 0611  NA 133* 129* 129*  K 3.4* 4.0 4.4  CL 96* 95* 93*  CO2 27 25 24   GLUCOSE 114* 107* 144*  BUN 8 8 9   CREATININE 0.76 0.78 0.72  CALCIUM 7.5* 7.3* 7.8*   LFT Recent Labs    08/02/23 0611  PROT 5.1*  ALBUMIN  1.9*  AST 29  ALT 17  ALKPHOS 153*  BILITOT 0.9   PT/INR Recent Labs    07/31/23 0810  LABPROT 17.2*  INR 1.4*    Studies/Results: IR Paracentesis Result Date: 08/01/2023 INDICATION: 43 year old male with abdominal distention, ascites. Request made for diagnostic and therapeutic paracentesis. EXAM: ULTRASOUND GUIDED DIAGNOSTIC AND THERAPEUTIC PARACENTESIS MEDICATIONS: 10 mL 1% lidocaine  COMPLICATIONS: None immediate. PROCEDURE: Informed written consent was obtained from the patient after a discussion of the risks, benefits and alternatives to treatment. A timeout was performed prior to the initiation of the procedure. Initial ultrasound scanning demonstrates a large amount of ascites within the  left lateral abdomen. The left lateral abdomen was prepped and draped in the usual sterile fashion. 1% lidocaine  was used for local anesthesia. Following this, a 19 gauge, 7-cm, Yueh catheter was introduced. An ultrasound image was saved for documentation purposes. The paracentesis was performed. The catheter was removed and a dressing was applied. The patient tolerated the procedure well without immediate post procedural complication. FINDINGS: A total of approximately 7.0 liters of brown/orange fluid was removed. Samples were sent to the laboratory as requested by the clinical team. IMPRESSION: Successful ultrasound-guided paracentesis yielding 7.0 liters of peritoneal fluid. Performed by: Kacie Matthews PA-C Electronically Signed   By: Ester Sides M.D.   On: 08/01/2023 15:29       Assessment / Plan:    43 y/o male here with the following:   Ascites Pancreatitis Alcohol abuse Possible alcoholic hepatitis   Recall MRCP shows pancreatitis and some pseudocysts. PD seems normal, no obvious duct disruption although MRCP cannot rule that out. No mass lesions or stones in the ducts. Repeat paracentesis of 7L removed yesterday. We added amylase / lipase and for some reason that remains pending. Cytology pending.   Given markedly elevated amylase on the initial paracentesis, concerned for pancreatic ascites, which is not common. Usually total protein is high in this circumstance, his is low, but this remains possible or ascites is from combination of pancreatitis + possible alcoholic hepatitis. Further fluid analysis will be helpful to further clarify  etiology and need to contact lab about that.  Clinically he is doing better today. Started on octreotide  empirically to reduce pancreatic secretions in case this is related to pancreatitis. Have also started lasix  20mg  / day and aldactone  50mg  / day which he tolerated well. Renal function stable, will increase dosing of diuretics today. Also have started  Creon .   Hopefully with these measures we can control his ascites, reduce need for paracentesis and perhaps discharge over the weekend. If this is pancreatic ascites, one could also consider ERCP for PD stenting, however that could have risks of making acute pancreatitis worse. I have spoken with my advanced endoscopy colleagues about his case, they agree to await further workup and his course prior to consideration for ERCP. Alcohol abstinence moving forward is critical.     PLAN: - will ask lab again to add on amylase / lipase to peritoneal fluid sample from yesterday, I am not sure why it is not back yet - continue diet for now - increase lasix  to 40mg  / day and aldactone  to 100 mg / day  - low Na diet - continue Creon  - continue empiric octreotide  for now for possible pancreatic ascites while peritoneal fluid analysis pending   Call with questions, will follow. Dr. Rollin / Kristie to assume the GI service this weekend   Marcey Naval, MD [facility]   ADDENDUM: Turns out that the amylase and lipase are sendout labs. There is a 5-10 day turnaround time on lipase result. The amylase ordered on yesterday's fluid should result over the weekend.

## 2023-08-02 NOTE — Progress Notes (Signed)
 PROGRESS NOTE    Juan Crawford  FMW:[id number] DOB: 06/20/1981 DOA: 07/28/2023 PCP: Valma Lannie LABOR, PA-C  Subjective: Pt seen and examined this AM.  Had 7 liter paracentesis yesterday. Had hunger pains yesterday due to NPO status Awaiting further lab studies on ascitic fluid. On IV octreotide  per GI  Asked RD to confirm pt is getting high protein diet on his meal trays. Started on lasix  and aldcatone by GI due to persistent ascites.   Hospital Course: HPI: Juan Crawford is a 43 y.o. male with medical history significant of alcohol use disorder and hypertension presenting with abdominal and leg swelling for the past 3 weeks.   He reports that he first noticed abdominal swelling about 3 weeks ago.  His abdominal swelling progressively became worse over the past couple of weeks and he began developing bilateral leg swelling as well.  He came to the emergency department as his abdominal and leg swelling was not going away on its own.  He does report history of alcohol use, drinks about 1/5 of liquor daily.  His last drink was around 4 AM this morning.  He denies any nausea, vomiting, yellowing of his skin, chest pain, palpitations, shortness of breath, abdominal pain, diarrhea, constipation, urinary changes.  He denies any history of delirium tremens.   ED course: Initial vital signs with tachycardia and elevated BP, but he is afebrile and saturating well on room air.  CBC showing leukocytosis but otherwise with stable hemoglobin and platelet count.  CMP showing hyponatremia, hypokalemia, elevated AST and alk phos, hypoalbuminemia, normal kidney function.  PT mildly elevated with normal INR.  Alcohol level elevated.  Lipase elevated at 182.  Lactate 2.1.  Ammonia 41.  BNP normal.  CT abdomen pelvis showing mild pancreatitis, fatty infiltration with hepatomegaly, and significant ascites.  Right upper quadrant ultrasound showing fatty liver and moderate ascites.  ED provider has reached  out to IR who will perform paracentesis tomorrow.  ED provider also consulted GI, Dr. Wilhelmenia, who will evaluate patient tomorrow.  [facility] hospitalist asked to evaluate patient for admission.    Significant Events: Admitted 07/28/2023 for new onset ascites   Significant Labs: Lipase elevated at 182. Lactate 2.1. Ammonia 41, ETOH 180  Significant Imaging Studies: CT abdomen pelvis showing mild pancreatitis, fatty infiltration with hepatomegaly, and significant ascites.  Right upper quadrant ultrasound showing fatty liver and moderate ascites.   Antibiotic Therapy: Anti-infectives (From admission, onward)    None       Procedures: 07-29-2023 Paracentesis for 4 liters 08-01-2023 paracentesis for 7 liters  Consultants: GI    Assessment and Plan: * Ascites 07-29-2023 s/p 4 liter paracentesis today. Ascites fluid does not meet criteria for SBP.  No abx are indicated at this time. 07-30-2023 GI continues workup for his ascites. May need to re-image in a few days to see if he needs another paracentesis. Discussed with pt that paracentesis would be repeated only if he develops difficulty with breathing or early satiety. 07-31-2023 pt with moderate amount of ascites again. Pt states abd distension is getting more uncomfortable again. Will ask IR for another large volume paracentesis tomorrow. 08-01-2023 for repeat paracentesis today. Large volume. Remove as much fluid as possible. RD consult to increase protein intake in his diet. Prn IV albumin  ordered after paracentesis. Defer to GI service to start aldactone  as they see fit. 08-02-2023 s/p 7 liter paracentesis yesterday. Received 50 g IV albumin  afterwards. GI concerned about ascites coming from pancreas. On IV octreotide  q8h.  Ascitic fluid yesterday NOT consistent with SBP. Started on po lasix  and po aldcatone per GI  Alcoholic cirrhosis of liver with ascites (HCC) 07-31-2023 his cirrhosis workup thus far has been unrevealing. Most  likely answer is that his chronic alcohol abuse is the cause of his cirrhosis with ascites.  Defer to GI if he needs EGD to evaluate for portal gastropathy. Defer to GI if pt needs SBP prophylaxis, aldactone . Pt already on atenolol  for his HTN. Can change over to coreg/nadolol/lopressor /propranolol if desired by GI. 08-01-2023 still somewhat tachy. BP has some room to increase atenolol . Will increase to 37.5 mg bid. 08-02-2023 HR right at 100. On atenolol  37.5 mg bid. No further room in BP to increase. Now on lasix  and aldcatone as well. Monitor BP.    Alcohol-induced acute pancreatitis without infection or necrosis 07-29-2023 only mild elevation of lipase. Stable. 07-30-2023 stable lipase. 07-31-2023 MRCP shows some cyst in his pancreas. 07-31-2023 pt to remain abstinent from all etoh use.  Alcohol use disorder 07-29-2023 continue with CIWA protocol. Pt admits to drinking 1/5 liquor per day. 07-30-2023 2 prn ativans given in the last 24 hours. 07-31-2023 2 prn ativans given since I last saw patient yesterday. Can stop CIWA soon. 08-01-2023 off CIWA protocol. Outside window now for Dts. 08-02-2023 has some anxiety/withdrawal yesterday. Started on prn librium .  Protein-calorie malnutrition, severe 08/01/2023 per RD note from today. Severe Malnutrition related to chronic illness as evidenced by severe muscle depletion, energy intake < or equal to 75% for > or equal to 1 month, mild fat depletion.   Essential hypertension 07-29-2023 start atenolol  12.5 mg bid. 07-30-2023 BP still elevated. Will increase atenolol  to 25 mg bid. 07-31-2023 continue with current dose of atenolol  25 mg bid. 08-01-2023 increase atenolol  to 37.5 mg bid to help with HR 100s. 08-02-2023 HR right at 100. On atenolol  37.5 mg bid. No further room in BP to increase. Now on lasix  and aldcatone as well. Monitor BP.   Hypokalemia 07-29-2023 replete with po kcl 07-30-2023 resolved. K of 3.6 07-31-2023 give more po  kcl. 08-01-2023 resolved with replacement.  Hyponatremia 07-29-2023 likely due to alcohol intake and very little solute intake. Asymptomatic at this point. Follow with daily BMP. 07-30-2023 Na down to 127. Will check serum osm, urine osm, urine electrolytes. My intuition says that his hyponatremia is from low solute intake rather than SIADH. TSH is normal at 1.68. repeat BMP in AM. 07-31-2023 unfortunately, his urine electrolytes not colleted when his sodium was low. Now his Na is up to 133. He is eating better last 24 hours. All this points to low solute intake as cause of his hyponatremia and not volume overload or SIADH 08-01-2023 down today due to NPO for paracentesis. Due to low solute intake. 08-02-2023 stable. Monitor now that he is on lasix  and aldactone .  Tobacco abuse 07-31-2023 continue with nicotine  patch.       DVT prophylaxis: Place and maintain sequential compression device Start: 07/28/23 2017    Code Status: Full Code Family Communication: no family at bedside. Pt is decisional. Disposition Plan: return home Reason for continuing need for hospitalization: awaiting further diagnostic workup by GI. On IV octreotide .  Objective: Vitals:   08/02/23 0456 08/02/23 0500 08/02/23 0807 08/02/23 0809  BP: 115/78  109/72 109/72  Pulse: 100  (!) 104 (!) 104  Resp: 16   18  Temp: 98.2 F (36.8 C)   98.3 F (36.8 C)  TempSrc: Oral     SpO2: 97%  98%  Weight:  76.9 kg    Height:        Intake/Output Summary (Last 24 hours) at 08/02/2023 1112 Last data filed at 08/02/2023 0400 Gross per 24 hour  Intake 910 ml  Output --  Net 910 ml   Filed Weights   07/31/23 0500 08/01/23 0445 08/02/23 0500  Weight: 87.3 kg 85 kg 76.9 kg    Examination:  Physical Exam Vitals and nursing note reviewed.  Constitutional:      General: He is not in acute distress.    Appearance: Normal appearance. He is not toxic-appearing.     Comments: Looks better today  HENT:     Head:  Normocephalic and atraumatic.     Nose: Nose normal.  Cardiovascular:     Rate and Rhythm: Normal rate and regular rhythm.     Pulses: Normal pulses.  Pulmonary:     Effort: Pulmonary effort is normal.     Breath sounds: Normal breath sounds.  Abdominal:     General: There is distension.     Palpations: Abdomen is soft.     Tenderness: There is no abdominal tenderness.     Comments: Abd less tense than yesterday  Musculoskeletal:     Right lower leg: No edema.     Left lower leg: No edema.     Comments: No LE edema today.  Skin:    General: Skin is warm and dry.     Capillary Refill: Capillary refill takes less than 2 seconds.  Neurological:     General: No focal deficit present.     Mental Status: He is alert and oriented to person, place, and time.     Data Reviewed: I have personally reviewed following labs and imaging studies  CBC: Recent Labs  Lab 07/28/23 1135 07/29/23 0606 07/30/23 0344 07/31/23 0810  WBC 12.7* 12.8* 11.6* 9.9  NEUTROABS 10.1*  --   --   --   HGB 13.3 11.7* 10.5* 11.2*  HCT 38.5* 34.1* 31.7* 33.5*  MCV 92.1 90.2 92.7 92.8  PLT 326 273 296 361   Basic Metabolic Panel: Recent Labs  Lab 07/28/23 1629 07/28/23 2058 07/29/23 0606 07/30/23 0344 07/31/23 0810 08/01/23 0248 08/02/23 0611  NA  --    < > 129* 127* 133* 129* 129*  K  --    < > 3.3* 3.6 3.4* 4.0 4.4  CL  --    < > 88* 92* 96* 95* 93*  CO2  --    < > 27 27 27 25 24   GLUCOSE  --    < > 169* 128* 114* 107* 144*  BUN  --    < > 6 5* 8 8 9   CREATININE  --    < > 0.70 0.71 0.76 0.78 0.72  CALCIUM  --    < > 7.5* 7.4* 7.5* 7.3* 7.8*  MG 1.8  --   --   --   --   --   --    < > = values in this interval not displayed.   GFR: Estimated Creatinine Clearance: 132.2 mL/min (by C-G formula based on SCr of 0.72 mg/dL). Liver Function Tests: Recent Labs  Lab 07/29/23 0606 07/30/23 0344 07/31/23 0810 08/01/23 0248 08/02/23 0611  AST 54* 38 45* 52* 29  ALT 24 20 22 23 17   ALKPHOS  326* 238* 235* 205* 153*  BILITOT 1.0 1.1 1.1 0.9 0.9  PROT 5.5* 4.9* 5.1* 5.2* 5.1*  ALBUMIN  1.9* 1.5* 1.6*  1.5* 1.9*   Recent Labs  Lab 07/28/23 1629 07/30/23 0344  LIPASE 176*  182* 164*   Recent Labs  Lab 07/28/23 1629  AMMONIA 41*   Coagulation Profile: Recent Labs  Lab 07/28/23 1629 07/29/23 0606 07/31/23 0810  INR 1.2 1.3* 1.4*   BNP (last 3 results) Recent Labs    07/28/23 1629  BNP 23.4   CBG: Recent Labs  Lab 07/30/23 0746 08/01/23 0827 08/02/23 0810  GLUCAP 120* 90 137*   Sepsis Labs: Recent Labs  Lab 07/28/23 1710  LATICACIDVEN 2.3*    Recent Results (from the past 240 hours)  Blood culture (routine x 2)     Status: None   Collection Time: 07/28/23  3:25 PM   Specimen: BLOOD  Result Value Ref Range Status   Specimen Description BLOOD SITE NOT SPECIFIED  Final   Special Requests   Final    BOTTLES DRAWN AEROBIC AND ANAEROBIC Blood Culture results may not be optimal due to an inadequate volume of blood received in culture bottles   Culture   Final    NO GROWTH 5 DAYS Performed at [facility], [address], [place], [place] [address]    Report Status 08/02/2023 FINAL  Final  Blood culture (routine x 2)     Status: None   Collection Time: 07/28/23  8:58 PM   Specimen: BLOOD RIGHT HAND  Result Value Ref Range Status   Specimen Description BLOOD RIGHT HAND  Final   Special Requests   Final    BOTTLES DRAWN AEROBIC AND ANAEROBIC Blood Culture results may not be optimal due to an excessive volume of blood received in culture bottles   Culture   Final    NO GROWTH 5 DAYS Performed at [facility], [address], [place], [place] [address]    Report Status 08/02/2023 FINAL  Final  Culture, body fluid w Gram Stain-bottle     Status: None (Preliminary result)   Collection Time: 07/29/23  8:15 AM   Specimen: Peritoneal Washings  Result Value Ref Range Status   Specimen Description PERITONEAL  Final   Special Requests NONE   Final   Culture   Final    NO GROWTH 4 DAYS Performed at [facility], [address] [place], [place] [address]    Report Status PENDING  Incomplete  Gram stain     Status: None   Collection Time: 07/29/23  8:15 AM   Specimen: Peritoneal Washings  Result Value Ref Range Status   Specimen Description PERITONEAL  Final   Special Requests NONE  Final   Gram Stain   Final    WBC SEEN NO ORGANISMS SEEN CYTOSPIN SMEAR Performed at [facility], [address] [place], [place] [address]    Report Status 07/29/2023 FINAL  Final  Culture, body fluid w Gram Stain-bottle     Status: None (Preliminary result)   Collection Time: 08/01/23 12:49 PM   Specimen: Peritoneal Washings  Result Value Ref Range Status   Specimen Description PERITONEAL  Final   Special Requests NONE  Final   Culture   Final    NO GROWTH < 24 HOURS Performed at [facility], [address] [place], [place] [address]    Report Status PENDING  Incomplete  Gram stain     Status: None   Collection Time: 08/01/23 12:49 PM   Specimen: Peritoneal Washings  Result Value Ref Range Status   Specimen Description PERITONEAL  Final   Special Requests NONE  Final   Gram Stain   Final    NO WBC SEEN NO ORGANISMS SEEN Performed at [facility], [address], [place], [place] [address]    Report Status 08/01/2023 FINAL  Final     Radiology Studies: IR Paracentesis Result Date: 08/01/2023 INDICATION: 43 year old male with abdominal distention, ascites. Request made for diagnostic and therapeutic paracentesis. EXAM: ULTRASOUND GUIDED DIAGNOSTIC AND THERAPEUTIC PARACENTESIS MEDICATIONS: 10 mL 1% lidocaine  COMPLICATIONS: None immediate. PROCEDURE: Informed written consent was obtained from the patient after a discussion of the risks, benefits and alternatives to treatment. A timeout was performed prior to the initiation of the procedure. Initial ultrasound scanning demonstrates a large amount of ascites  within the left lateral abdomen. The left lateral abdomen was prepped and draped in the usual sterile fashion. 1% lidocaine  was used for local anesthesia. Following this, a 19 gauge, 7-cm, Yueh catheter was introduced. An ultrasound image was saved for documentation purposes. The paracentesis was performed. The catheter was removed and a dressing was applied. The patient tolerated the procedure well without immediate post procedural complication. FINDINGS: A total of approximately 7.0 liters of brown/orange fluid was removed. Samples were sent to the laboratory as requested by the clinical team. IMPRESSION: Successful ultrasound-guided paracentesis yielding 7.0 liters of peritoneal fluid. Performed by: Kacie Matthews PA-C Electronically Signed   By: Ester Sides M.D.   On: 08/01/2023 15:29    Scheduled Meds:  atenolol   37.5 mg Oral BID   feeding supplement  237 mL Oral TID BM   folic acid   1 mg Oral Daily   furosemide   20 mg Oral Once   [START ON 08/03/2023] furosemide   40 mg Oral Daily   lidocaine   1 patch Transdermal Q24H   lipase/protease/amylase  36,000 Units Oral TID WC   lipase/protease/amylase  36,000 Units Oral BID BM   multivitamin with minerals  1 tablet Oral Daily   nicotine   21 mg Transdermal Daily   octreotide   100 mcg Subcutaneous Q8H   [START ON 08/03/2023] spironolactone   100 mg Oral Daily   spironolactone   50 mg Oral Once   thiamine   100 mg Oral Daily   Or   thiamine   100 mg Intravenous Daily   Continuous Infusions:  albumin  human Stopped (08/01/23 1530)     LOS: 4 days   Time spent: 40 minutes  Camellia Door, DO  [facility]  08/02/2023, 11:12 AM

## 2023-08-03 DIAGNOSIS — E871 Hypo-osmolality and hyponatremia: Secondary | ICD-10-CM | POA: Diagnosis not present

## 2023-08-03 DIAGNOSIS — K7031 Alcoholic cirrhosis of liver with ascites: Secondary | ICD-10-CM | POA: Diagnosis not present

## 2023-08-03 DIAGNOSIS — I10 Essential (primary) hypertension: Secondary | ICD-10-CM | POA: Diagnosis not present

## 2023-08-03 DIAGNOSIS — K852 Alcohol induced acute pancreatitis without necrosis or infection: Secondary | ICD-10-CM | POA: Diagnosis not present

## 2023-08-03 LAB — COMPREHENSIVE METABOLIC PANEL
ALT: 17 U/L (ref 0–44)
AST: 26 U/L (ref 15–41)
Albumin: 1.9 g/dL — ABNORMAL LOW (ref 3.5–5.0)
Alkaline Phosphatase: 131 U/L — ABNORMAL HIGH (ref 38–126)
Anion gap: 11 (ref 5–15)
BUN: 12 mg/dL (ref 6–20)
CO2: 25 mmol/L (ref 22–32)
Calcium: 7.8 mg/dL — ABNORMAL LOW (ref 8.9–10.3)
Chloride: 93 mmol/L — ABNORMAL LOW (ref 98–111)
Creatinine, Ser: 0.85 mg/dL (ref 0.61–1.24)
GFR, Estimated: >60 mL/min (ref >60)
Glucose, Bld: 154 mg/dL — ABNORMAL HIGH (ref 70–99)
Potassium: 4.1 mmol/L (ref 3.5–5.1)
Sodium: 129 mmol/L — ABNORMAL LOW (ref 135–145)
Total Bilirubin: 0.8 mg/dL (ref 0.0–1.2)
Total Protein: 5.6 g/dL — ABNORMAL LOW (ref 6.5–8.1)

## 2023-08-03 LAB — CULTURE, BODY FLUID W GRAM STAIN -BOTTLE: Culture: NO GROWTH

## 2023-08-03 LAB — MAGNESIUM: Magnesium: 1.7 mg/dL (ref 1.7–2.4)

## 2023-08-03 LAB — GLUCOSE, CAPILLARY: Glucose-Capillary: 149 mg/dL — ABNORMAL HIGH (ref 70–99)

## 2023-08-03 NOTE — Progress Notes (Signed)
 PROGRESS NOTE    Juan Crawford  FMW:[id number] DOB: [birth year] DOA: 07/28/2023 PCP: Valma Lannie LABOR, PA-C  Subjective: Pt seen and examined this AM.  Pt is getting double portions of protein in his meals Advised he needs to consume 1-1.5 g/kg of protein each day  Bedside abd U/S shows minimal ascites in RLQ and LLQ. No ascites in RUQ/LUQ. Definitely not enough ascites to safely perform paracentesis.  Ascitic fluid lipase was 25,473 and ascitic fluid amylase of 4478. Pancreas is likely the source of his ascites. Awaiting further f/u from GI service. Pt remains on IV octreotide .   Hospital Course: HPI: Juan Crawford is a 43 y.o. male with medical history significant of alcohol use disorder and hypertension presenting with abdominal and leg swelling for the past 3 weeks.   He reports that he first noticed abdominal swelling about 3 weeks ago.  His abdominal swelling progressively became worse over the past couple of weeks and he began developing bilateral leg swelling as well.  He came to the emergency department as his abdominal and leg swelling was not going away on its own.  He does report history of alcohol use, drinks about 1/5 of liquor daily.  His last drink was around 4 AM this morning.  He denies any nausea, vomiting, yellowing of his skin, chest pain, palpitations, shortness of breath, abdominal pain, diarrhea, constipation, urinary changes.  He denies any history of delirium tremens.   ED course: Initial vital signs with tachycardia and elevated BP, but he is afebrile and saturating well on room air.  CBC showing leukocytosis but otherwise with stable hemoglobin and platelet count.  CMP showing hyponatremia, hypokalemia, elevated AST and alk phos, hypoalbuminemia, normal kidney function.  PT mildly elevated with normal INR.  Alcohol level elevated.  Lipase elevated at 182.  Lactate 2.1.  Ammonia 41.  BNP normal.  CT abdomen pelvis showing mild pancreatitis, fatty infiltration  with hepatomegaly, and significant ascites.  Right upper quadrant ultrasound showing fatty liver and moderate ascites.  ED provider has reached out to IR who will perform paracentesis tomorrow.  ED provider also consulted GI, Dr. Wilhelmenia, who will evaluate patient tomorrow.  [facility] asked to evaluate patient for admission.    Significant Events: Admitted 07/28/2023 for new onset ascites   Significant Labs: Lipase elevated at 182. Lactate 2.1. Ammonia 41, ETOH 180  Significant Imaging Studies: CT abdomen pelvis showing mild pancreatitis, fatty infiltration with hepatomegaly, and significant ascites.  Right upper quadrant ultrasound showing fatty liver and moderate ascites.   Antibiotic Therapy: Anti-infectives (From admission, onward)    None       Procedures: 07-29-2023 Paracentesis for 4 liters 08-01-2023 paracentesis for 7 liters  Consultants: GI    Assessment and Plan: * Ascites 07-29-2023 s/p 4 liter paracentesis today. Ascites fluid does not meet criteria for SBP.  No abx are indicated at this time. 07-30-2023 GI continues workup for his ascites. May need to re-image in a few days to see if he needs another paracentesis. Discussed with pt that paracentesis would be repeated only if he develops difficulty with breathing or early satiety. 07-31-2023 pt with moderate amount of ascites again. Pt states abd distension is getting more uncomfortable again. Will ask IR for another large volume paracentesis tomorrow. 08-01-2023 for repeat paracentesis today. Large volume. Remove as much fluid as possible. RD consult to increase protein intake in his diet. Prn IV albumin  ordered after paracentesis. Defer to GI service to start aldactone  as they see  fit. 08-02-2023 s/p 7 liter paracentesis yesterday. Received 50 g IV albumin  afterwards. GI concerned about ascites coming from pancreas. On IV octreotide  q8h. Ascitic fluid yesterday NOT consistent with SBP. Started on po lasix   and po aldcatone per GI 08-03-2023 Advised he needs to consume 1-1.5 g/kg of protein each day. Bedside abd U/S shows minimal ascites in RLQ and LLQ. No ascites in RUQ/LUQ. Definitely not enough ascites to safely perform paracentesis. Ascitic fluid lipase was 25,473 and ascitic fluid amylase of 4478. Pancreas is likely the source of his ascites. Awaiting further f/u from GI service.Pt remains on IV octreotide  TID per GI service  Alcoholic cirrhosis of liver with ascites (HCC) 07-31-2023 his cirrhosis workup thus far has been unrevealing. 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  Defer to GI if he needs EGD to evaluate for portal gastropathy. Defer to GI if pt needs SBP prophylaxis, aldactone . Pt already on atenolol  for his HTN. Can change over to coreg/nadolol/lopressor /propranolol if desired by GI. 08-01-2023 still somewhat tachy. BP has some room to increase atenolol . Will increase to 37.5 mg bid. 08-02-2023 HR right at 100. On atenolol  37.5 mg bid. No further room in BP to increase. Now on lasix  and aldcatone as well. Monitor BP.    Alcohol-induced acute pancreatitis without infection or necrosis 07-29-2023 only mild elevation of lipase. Stable. 07-30-2023 stable lipase. 07-31-2023 MRCP shows some cyst in his pancreas. 08-01-2023 pt to remain abstinent from all etoh use. 08-03-2023 appears his ascites is related to his pancreas. Awaiting for GI service to address his IV octreotide .  Alcohol use disorder 07-29-2023 continue with CIWA protocol. Pt admits to drinking 1/5 liquor per day. 07-30-2023 2 prn ativans given in the last 24 hours. 07-31-2023 2 prn ativans given since I last saw patient yesterday. Can stop CIWA soon. 08-01-2023 off CIWA protocol. Outside window now for Dts. 08-02-2023 has some anxiety/withdrawal yesterday. Started on prn librium . 08-03-2023 continue prn librium   Protein-calorie malnutrition, severe 08/01/2023 per  RD note from today. Severe Malnutrition related to chronic illness as evidenced by severe muscle depletion, energy intake < or equal to 75% for > or equal to 1 month, mild fat depletion.   Essential hypertension 07-29-2023 start atenolol  12.5 mg bid. 07-30-2023 BP still elevated. Will increase atenolol  to 25 mg bid. 07-31-2023 continue with current dose of atenolol  25 mg bid. 08-01-2023 increase atenolol  to 37.5 mg bid to help with HR 100s. 08-02-2023 HR right at 100. On atenolol  37.5 mg bid. No further room in BP to increase. Now on lasix  and aldcatone as well. Monitor BP.  08-03-2023 stable on atenolol  37.5 bid.  Hypokalemia 07-29-2023 replete with po kcl 07-30-2023 resolved. K of 3.6 07-31-2023 give more po kcl. 08-01-2023 resolved with replacement.  Hyponatremia 07-29-2023 likely due to alcohol intake and very little solute intake. Asymptomatic at this point. Follow with daily BMP. 07-30-2023 Na down to 127. Will check serum osm, urine osm, urine electrolytes. My intuition says that his hyponatremia is from low solute intake rather than SIADH. TSH is normal at 1.68. repeat BMP in AM. 07-31-2023 unfortunately, his urine electrolytes not colleted when his sodium was low. Now his Na is up to 133. He is eating better last 24 hours. All this points to low solute intake as cause of his hyponatremia and not volume overload or SIADH 08-01-2023 down today due to NPO for paracentesis. Due to low solute intake. 08-02-2023 stable. Monitor now that he is  on lasix  and aldactone .  08-03-2023 stable. Na of 129. On po lasix  and aldactone .  Tobacco abuse 07-31-2023 continue with nicotine  patch.   DVT prophylaxis: Place and maintain sequential compression device Start: 07/28/23 2017    Code Status: Full Code Family Communication: no family at bedside Disposition Plan: return home Reason for continuing need for hospitalization: awaiting GI to address pt's IV octreotide . From my standpoint he is  medically stable.  Objective: Vitals:   08/02/23 2005 08/03/23 0437 08/03/23 0449 08/03/23 0756  BP: 112/81  115/76 107/73  Pulse: (!) 104  (!) 101 (!) 107  Resp:    18  Temp: 99.4 F (37.4 C)  98.5 F (36.9 C) 98.7 F (37.1 C)  TempSrc: Oral  Oral Oral  SpO2: 97%  97% 98%  Weight:  76.9 kg    Height:        Intake/Output Summary (Last 24 hours) at 08/03/2023 1150 Last data filed at 08/02/2023 1700 Gross per 24 hour  Intake 720 ml  Output --  Net 720 ml   Filed Weights   08/01/23 0445 08/02/23 0500 08/03/23 0437  Weight: 85 kg 76.9 kg 76.9 kg    Examination:  Physical Exam Vitals and nursing note reviewed.  Constitutional:      General: He is not in acute distress.    Appearance: He is not toxic-appearing.     Comments: Overall looks better than earlier this week.  HENT:     Head: Normocephalic and atraumatic.  Eyes:     General: No scleral icterus. Cardiovascular:     Rate and Rhythm: Normal rate and regular rhythm.  Abdominal:     General: Abdomen is protuberant. Bowel sounds are normal.     Palpations: Abdomen is soft.     Comments: Bedside abd U/S shows minimal ascites in RLQ and LLQ. No ascites in RUQ/LUQ. Definitely not enough ascites to safely perform paracentesis.  Musculoskeletal:     Right lower leg: No edema.     Left lower leg: No edema.  Skin:    General: Skin is warm and dry.     Capillary Refill: Capillary refill takes less than 2 seconds.  Neurological:     Mental Status: He is alert and oriented to person, place, and time.     Data Reviewed: I have personally reviewed following labs and imaging studies  CBC: Recent Labs  Lab 07/28/23 1135 07/29/23 0606 07/30/23 0344 07/31/23 0810  WBC 12.7* 12.8* 11.6* 9.9  NEUTROABS 10.1*  --   --   --   HGB 13.3 11.7* 10.5* 11.2*  HCT 38.5* 34.1* 31.7* 33.5*  MCV 92.1 90.2 92.7 92.8  PLT 326 273 296 361   Basic Metabolic Panel: Recent Labs  Lab 07/28/23 1629 07/28/23 2058 07/30/23 0344  07/31/23 0810 08/01/23 0248 08/02/23 0611 08/03/23 0801  NA  --    < > 127* 133* 129* 129* 129*  K  --    < > 3.6 3.4* 4.0 4.4 4.1  CL  --    < > 92* 96* 95* 93* 93*  CO2  --    < > 27 27 25 24 25   GLUCOSE  --    < > 128* 114* 107* 144* 154*  BUN  --    < > 5* 8 8 9 12   CREATININE  --    < > 0.71 0.76 0.78 0.72 0.85  CALCIUM  --    < > 7.4* 7.5* 7.3* 7.8* 7.8*  MG 1.8  --   --   --   --   --  1.7   < > = values in this interval not displayed.   GFR: Estimated Creatinine Clearance: 124.4 mL/min (by C-G formula based on SCr of 0.85 mg/dL). Liver Function Tests: Recent Labs  Lab 07/30/23 0344 07/31/23 0810 08/01/23 0248 08/02/23 0611 08/03/23 0801  AST 38 45* 52* 29 26  ALT 20 22 23 17 17   ALKPHOS 238* 235* 205* 153* 131*  BILITOT 1.1 1.1 0.9 0.9 0.8  PROT 4.9* 5.1* 5.2* 5.1* 5.6*  ALBUMIN  1.5* 1.6* 1.5* 1.9* 1.9*   Recent Labs  Lab 07/28/23 1629 07/30/23 0344  LIPASE 176*  182* 164*   Recent Labs  Lab 07/28/23 1629  AMMONIA 41*   Coagulation Profile: Recent Labs  Lab 07/28/23 1629 07/29/23 0606 07/31/23 0810  INR 1.2 1.3* 1.4*   BNP (last 3 results) Recent Labs    07/28/23 1629  BNP 23.4   CBG: Recent Labs  Lab 07/30/23 0746 08/01/23 0827 08/02/23 0810 08/03/23 0754  GLUCAP 120* 90 137* 149*   Sepsis Labs: Recent Labs  Lab 07/28/23 1710  LATICACIDVEN 2.3*    Recent Results (from the past 240 hours)  Blood culture (routine x 2)     Status: None   Collection Time: 07/28/23  3:25 PM   Specimen: BLOOD  Result Value Ref Range Status   Specimen Description BLOOD SITE NOT SPECIFIED  Final   Special Requests   Final    BOTTLES DRAWN AEROBIC AND ANAEROBIC Blood Culture results may not be optimal due to an inadequate volume of blood received in culture bottles   Culture   Final    NO GROWTH 5 DAYS Performed at [facility], [address] [place], [place] [address]    Report Status 08/02/2023 FINAL  Final  Blood culture (routine x 2)      Status: None   Collection Time: 07/28/23  8:58 PM   Specimen: BLOOD RIGHT HAND  Result Value Ref Range Status   Specimen Description BLOOD RIGHT HAND  Final   Special Requests   Final    BOTTLES DRAWN AEROBIC AND ANAEROBIC Blood Culture results may not be optimal due to an excessive volume of blood received in culture bottles   Culture   Final    NO GROWTH 5 DAYS Performed at [facility], [address], [place], [place] [address]    Report Status 08/02/2023 FINAL  Final  Culture, body fluid w Gram Stain-bottle     Status: None   Collection Time: 07/29/23  8:15 AM   Specimen: Peritoneal Washings  Result Value Ref Range Status   Specimen Description PERITONEAL  Final   Special Requests NONE  Final   Culture   Final    NO GROWTH 5 DAYS Performed at [facility], [address], [place], [place] [address]    Report Status 08/03/2023 FINAL  Final  Gram stain     Status: None   Collection Time: 07/29/23  8:15 AM   Specimen: Peritoneal Washings  Result Value Ref Range Status   Specimen Description PERITONEAL  Final   Special Requests NONE  Final   Gram Stain   Final    WBC SEEN NO ORGANISMS SEEN CYTOSPIN SMEAR Performed at [facility], [address] [place], [place] [address]    Report Status 07/29/2023 FINAL  Final  Culture, body fluid w Gram Stain-bottle     Status: None (Preliminary result)   Collection Time: 08/01/23 12:49 PM   Specimen: Peritoneal  Washings  Result Value Ref Range Status   Specimen Description PERITONEAL  Final   Special Requests NONE  Final   Culture   Final    NO GROWTH 2 DAYS Performed at [facility], [address] [place], [place] [address]    Report Status PENDING  Incomplete  Gram stain     Status: None   Collection Time: 08/01/23 12:49 PM   Specimen: Peritoneal Washings  Result Value Ref Range Status   Specimen Description PERITONEAL  Final   Special Requests NONE  Final   Gram Stain   Final    NO WBC  SEEN NO ORGANISMS SEEN Performed at [facility], [address] [place], [place] [address]    Report Status 08/01/2023 FINAL  Final     Radiology Studies: IR Paracentesis Result Date: 08/01/2023 INDICATION: 43 year old male with abdominal distention, ascites. Request made for diagnostic and therapeutic paracentesis. EXAM: ULTRASOUND GUIDED DIAGNOSTIC AND THERAPEUTIC PARACENTESIS MEDICATIONS: 10 mL 1% lidocaine  COMPLICATIONS: None immediate. PROCEDURE: Informed written consent was obtained from the patient after a discussion of the risks, benefits and alternatives to treatment. A timeout was performed prior to the initiation of the procedure. Initial ultrasound scanning demonstrates a large amount of ascites within the left lateral abdomen. The left lateral abdomen was prepped and draped in the usual sterile fashion. 1% lidocaine  was used for local anesthesia. Following this, a 19 gauge, 7-cm, Yueh catheter was introduced. An ultrasound image was saved for documentation purposes. The paracentesis was performed. The catheter was removed and a dressing was applied. The patient tolerated the procedure well without immediate post procedural complication. FINDINGS: A total of approximately 7.0 liters of brown/orange fluid was removed. Samples were sent to the laboratory as requested by the clinical team. IMPRESSION: Successful ultrasound-guided paracentesis yielding 7.0 liters of peritoneal fluid. Performed by: Kacie Matthews PA-C Electronically Signed   By: Ester Sides M.D.   On: 08/01/2023 15:29    Scheduled Meds:  atenolol   37.5 mg Oral BID   feeding supplement  237 mL Oral TID BM   folic acid   1 mg Oral Daily   furosemide   40 mg Oral Daily   lidocaine   1 patch Transdermal Q24H   lipase/protease/amylase  36,000 Units Oral TID WC   lipase/protease/amylase  36,000 Units Oral BID BM   multivitamin with minerals  1 tablet Oral Daily   nicotine   21 mg Transdermal Daily   octreotide   100 mcg  Subcutaneous Q8H   spironolactone   100 mg Oral Daily   thiamine   100 mg Oral Daily   Or   thiamine   100 mg Intravenous Daily   Continuous Infusions:   LOS: 5 days   Time spent: 40 minutes  Camellia Door, DO  [facility]  08/03/2023, 11:50 AM

## 2023-08-03 NOTE — Progress Notes (Addendum)
 CROSS COVER [facility]GI Subjective: Patient claims he is feeling much better today and denies any having problems with abdominal pain or nausea.  He is tolerating his diet well.  He is anxious to go home.  He had a paracentesis couple of days ago and is awaiting the results on amylase lipase and the pancreatic fluid.  Objective: Vital signs in last 24 hours: Temp:  [98.3 F (36.8 C)-99.4 F (37.4 C)] 98.5 F (36.9 C) (01/04 0449) Pulse Rate:  [101-104] 101 (01/04 0449) Resp:  [18] 18 (01/03 0809) BP: (109-115)/(72-81) 115/76 (01/04 0449) SpO2:  [97 %-98 %] 97 % (01/04 0449) Weight:  [76.9 kg] 76.9 kg (01/04 0437) Last BM Date : 08/02/23  Intake/Output from previous day: 01/03 0701 - 01/04 0700 In: 1080 [P.O.:1080] Out: -  Intake/Output this shift: No intake/output data recorded.  General appearance: alert, cooperative, appears older than stated age, icteric, and no distress Resp: clear to auscultation bilaterally Cardio: regular rate and rhythm, S1, S2 normal, no murmur, click, rub or gallop GI: soft, non-tender; bowel sounds normal; no masses,  no organomegaly  Lab Results: Recent Labs    07/31/23 0810  WBC 9.9  HGB 11.2*  HCT 33.5*  PLT 361   BMET Recent Labs    07/31/23 0810 08/01/23 0248 08/02/23 0611  NA 133* 129* 129*  K 3.4* 4.0 4.4  CL 96* 95* 93*  CO2 27 25 24   GLUCOSE 114* 107* 144*  BUN 8 8 9   CREATININE 0.76 0.78 0.72  CALCIUM 7.5* 7.3* 7.8*   LFT Recent Labs    08/02/23 0611  PROT 5.1*  ALBUMIN  1.9*  AST 29  ALT 17  ALKPHOS 153*  BILITOT 0.9   PT/INR Recent Labs    07/31/23 0810  LABPROT 17.2*  INR 1.4*   Hepatitis Panel No results for input(s): HEPBSAG, HCVAB, HEPAIGM, HEPBIGM in the last 72 hours. C-Diff No results for input(s): CDIFFTOX in the last 72 hours. No results for input(s): CDIFFPCR in the last 72 hours. Fecal Lactopherrin No results for input(s): FECLLACTOFRN in the last 72 hours.  Studies/Results: IR  Paracentesis Result Date: 08/01/2023 INDICATION: 43 year old male with abdominal distention, ascites. Request made for diagnostic and therapeutic paracentesis. EXAM: ULTRASOUND GUIDED DIAGNOSTIC AND THERAPEUTIC PARACENTESIS MEDICATIONS: 10 mL 1% lidocaine  COMPLICATIONS: None immediate. PROCEDURE: Informed written consent was obtained from the patient after a discussion of the risks, benefits and alternatives to treatment. A timeout was performed prior to the initiation of the procedure. Initial ultrasound scanning demonstrates a large amount of ascites within the left lateral abdomen. The left lateral abdomen was prepped and draped in the usual sterile fashion. 1% lidocaine  was used for local anesthesia. Following this, a 19 gauge, 7-cm, Yueh catheter was introduced. An ultrasound image was saved for documentation purposes. The paracentesis was performed. The catheter was removed and a dressing was applied. The patient tolerated the procedure well without immediate post procedural complication. FINDINGS: A total of approximately 7.0 liters of brown/orange fluid was removed. Samples were sent to the laboratory as requested by the clinical team. IMPRESSION: Successful ultrasound-guided paracentesis yielding 7.0 liters of peritoneal fluid. Performed by: Kacie Matthews PA-C Electronically Signed   By: Ester Sides M.D.   On: 08/01/2023 15:29    Medications: I have reviewed the patient's current medications. Prior to Admission:  Medications Prior to Admission  Medication Sig Dispense Refill Last Dose/Taking   amLODipine  (NORVASC ) 10 MG tablet Take 10 mg by mouth daily.   07/27/2023  Scheduled:  atenolol   37.5 mg Oral BID   feeding supplement  237 mL Oral TID BM   folic acid   1 mg Oral Daily   furosemide   40 mg Oral Daily   lidocaine   1 patch Transdermal Q24H   lipase/protease/amylase  36,000 Units Oral TID WC   lipase/protease/amylase  36,000 Units Oral BID BM   multivitamin with minerals  1 tablet  Oral Daily   nicotine   21 mg Transdermal Daily   octreotide   100 mcg Subcutaneous Q8H   spironolactone   100 mg Oral Daily   thiamine   100 mg Oral Daily   Or   thiamine   100 mg Intravenous Daily   Continuous: PRN:acetaminophen  **OR** acetaminophen , chlordiazePOXIDE , oxyCODONE   Assessment/Plan: 1) Alcoholic pancreatitis with pseudocyst and ascites-[ascitic lipase is still pending], hyponatremia and severe hypoalbuminemia-patient's had 4 L of ascitic fluid removed on 07/29/2023 and is awaiting another tap in a couple of days. 2) Acute alcoholic hepatitis. 3) Iron deficiency anemia with no evidence of GI bleeding.  Patient will need further evaluation with an EGD to rule out varices once he is medically stable 4) HTN.    LOS: 5 days   Renaye Sous 08/03/2023, 7:34 AM

## 2023-08-04 DIAGNOSIS — K852 Alcohol induced acute pancreatitis without necrosis or infection: Secondary | ICD-10-CM | POA: Diagnosis not present

## 2023-08-04 DIAGNOSIS — E871 Hypo-osmolality and hyponatremia: Secondary | ICD-10-CM | POA: Diagnosis not present

## 2023-08-04 DIAGNOSIS — I10 Essential (primary) hypertension: Secondary | ICD-10-CM | POA: Diagnosis not present

## 2023-08-04 DIAGNOSIS — K7031 Alcoholic cirrhosis of liver with ascites: Secondary | ICD-10-CM | POA: Diagnosis not present

## 2023-08-04 LAB — COMPREHENSIVE METABOLIC PANEL
ALT: 14 U/L (ref 0–44)
AST: 21 U/L (ref 15–41)
Albumin: 1.7 g/dL — ABNORMAL LOW (ref 3.5–5.0)
Alkaline Phosphatase: 110 U/L (ref 38–126)
Anion gap: 11 (ref 5–15)
BUN: 10 mg/dL (ref 6–20)
CO2: 27 mmol/L (ref 22–32)
Calcium: 7.8 mg/dL — ABNORMAL LOW (ref 8.9–10.3)
Chloride: 93 mmol/L — ABNORMAL LOW (ref 98–111)
Creatinine, Ser: 0.77 mg/dL (ref 0.61–1.24)
GFR, Estimated: >60 mL/min (ref >60)
Glucose, Bld: 156 mg/dL — ABNORMAL HIGH (ref 70–99)
Potassium: 3.6 mmol/L (ref 3.5–5.1)
Sodium: 131 mmol/L — ABNORMAL LOW (ref 135–145)
Total Bilirubin: 0.7 mg/dL (ref 0.0–1.2)
Total Protein: 5.4 g/dL — ABNORMAL LOW (ref 6.5–8.1)

## 2023-08-04 LAB — LIPASE, FLUID: Lipase-Fluid: 41643 U/L

## 2023-08-04 LAB — MAGNESIUM: Magnesium: 1.8 mg/dL (ref 1.7–2.4)

## 2023-08-04 LAB — GLUCOSE, CAPILLARY: Glucose-Capillary: 168 mg/dL — ABNORMAL HIGH (ref 70–99)

## 2023-08-04 MED ORDER — FUROSEMIDE 40 MG PO TABS
60.0000 mg | ORAL_TABLET | Freq: Two times a day (BID) | ORAL | Status: DC
  Administered 2023-08-04 – 2023-08-05 (×2): 60 mg via ORAL
  Filled 2023-08-04 (×2): qty 1

## 2023-08-04 NOTE — Progress Notes (Signed)
 CROSS COVER [facility] Subjective: Since I last evaluated the patient, patient seems to be fair stable except for slight reaccumulation of his ascites.  He he denies having any abdominal pain nausea vomiting.  He is tolerating his diet well.  Objective: Vital signs in last 24 hours: Temp:  [98.7 F (37.1 C)-98.9 F (37.2 C)] 98.9 F (37.2 C) (01/05 0727) Pulse Rate:  [91-107] 91 (01/05 0727) Resp:  [16-18] 17 (01/05 0727) BP: (105-108)/(68-73) 108/70 (01/05 0727) SpO2:  [95 %-98 %] 95 % (01/05 0727) Weight:  [78.6 kg] 78.6 kg (01/05 0317) Last BM Date : 08/03/23  Intake/Output from previous day: 01/04 0701 - 01/05 0700 In: 720 [P.O.:720] Out: -  Intake/Output this shift: No intake/output data recorded.  General appearance: alert, cooperative, appears older than stated age, fatigued, no distress, and pale Resp: clear to auscultation bilaterally Cardio: regular rate and rhythm, S1, S2 normal, no murmur, click, rub or gallop GI: soft, distended with fluid thrill secondary to ascites non-tender; bowel sounds normal; no masses,  no organomegaly  Lab Results:  BMET Recent Labs    08/02/23 0611 08/03/23 0801  NA 129* 129*  K 4.4 4.1  CL 93* 93*  CO2 24 25  GLUCOSE 144* 154*  BUN 9 12  CREATININE 0.72 0.85  CALCIUM 7.8* 7.8*   LFT Recent Labs    08/03/23 0801  PROT 5.6*  ALBUMIN  1.9*  AST 26  ALT 17  ALKPHOS 131*  BILITOT 0.8   Studies/Results: No results found.  Medications: I have reviewed the patient's current medications. Prior to Admission:  Medications Prior to Admission  Medication Sig Dispense Refill Last Dose/Taking   amLODipine  (NORVASC ) 10 MG tablet Take 10 mg by mouth daily.   07/27/2023   Scheduled:  atenolol   37.5 mg Oral BID   feeding supplement  237 mL Oral TID BM   folic acid   1 mg Oral Daily   furosemide   40 mg Oral Daily   lidocaine   1 patch Transdermal Q24H   lipase/protease/amylase  36,000 Units Oral TID WC   lipase/protease/amylase   36,000 Units Oral BID BM   multivitamin with minerals  1 tablet Oral Daily   nicotine   21 mg Transdermal Daily   octreotide   100 mcg Subcutaneous Q8H   spironolactone   100 mg Oral Daily   thiamine   100 mg Oral Daily   Or   thiamine   100 mg Intravenous Daily   Continuous: PRN:acetaminophen  **OR** acetaminophen , chlordiazePOXIDE , oxyCODONE   Assessment/Plan: 1) Alcoholic pancreatitis with pseudocyst and ascites-[ascitic lipase is still pending], hyponatremia and severe hypoalbuminemia.  Lipase level is 41 643 and ascitic fluid on 08/01/2023 and was 25,473 on 07/29/2023.SABRA 2) Acute alcoholic hepatitis-improved. 3) Iron deficiency anemia with no evidence of GI bleeding.  Patient will need further evaluation with an EGD to rule out varices once he is medically stable 4) HTN. 5) Malnutrition with hypoalbuminemia.    LOS: 6 days   Renaye Sous 08/04/2023, 7:46 AM

## 2023-08-04 NOTE — Progress Notes (Signed)
 PROGRESS NOTE    Juan Crawford  FMW:[id number] DOB: [birth year] DOA: 07/28/2023 PCP: Valma Lannie LABOR, PA-C  Subjective: Pt seen and examined this AM.  Pt remains on SQ octreotide . Awaiting GI to address whether he needs to continue with medication or not.  Bedside abd U/S shows mild ascites. More in LLQ than RLQ. No accessible window for paracentesis.  Pt states he is not urinating that much more with po lasix  dose. Will plan to increase lasix  today.   Pt is hoping to leave the hospital soon.   Hospital Course: HPI: Juan Crawford is a 43 y.o. male with medical history significant of alcohol use disorder and hypertension presenting with abdominal and leg swelling for the past 3 weeks.   He reports that he first noticed abdominal swelling about 3 weeks ago.  His abdominal swelling progressively became worse over the past couple of weeks and he began developing bilateral leg swelling as well.  He came to the emergency department as his abdominal and leg swelling was not going away on its own.  He does report history of alcohol use, drinks about 1/5 of liquor daily.  His last drink was around 4 AM this morning.  He denies any nausea, vomiting, yellowing of his skin, chest pain, palpitations, shortness of breath, abdominal pain, diarrhea, constipation, urinary changes.  He denies any history of delirium tremens.   ED course: Initial vital signs with tachycardia and elevated BP, but he is afebrile and saturating well on room air.  CBC showing leukocytosis but otherwise with stable hemoglobin and platelet count.  CMP showing hyponatremia, hypokalemia, elevated AST and alk phos, hypoalbuminemia, normal kidney function.  PT mildly elevated with normal INR.  Alcohol level elevated.  Lipase elevated at 182.  Lactate 2.1.  Ammonia 41.  BNP normal.  CT abdomen pelvis showing mild pancreatitis, fatty infiltration with hepatomegaly, and significant ascites.  Right upper quadrant ultrasound showing  fatty liver and moderate ascites.  ED provider has reached out to IR who will perform paracentesis tomorrow.  ED provider also consulted GI, Dr. Wilhelmenia, who will evaluate patient tomorrow.  [facility] hospital[facility] asked to evaluate patient for admission.    Significant Events: Admitted 07/28/2023 for new onset ascites   Significant Labs: Lipase elevated at 182. Lactate 2.1. Ammonia 41, ETOH 180  Significant Imaging Studies: CT abdomen pelvis showing mild pancreatitis, fatty infiltration with hepatomegaly, and significant ascites.  Right upper quadrant ultrasound showing fatty liver and moderate ascites.   Antibiotic Therapy: Anti-infectives (From admission, onward)    None       Procedures: 07-29-2023 Paracentesis for 4 liters 08-01-2023 paracentesis for 7 liters  Consultants: GI    Assessment and Plan: * Ascites 07-29-2023 s/p 4 liter paracentesis today. Ascites fluid does not meet criteria for SBP.  No abx are indicated at this time. 07-30-2023 GI continues workup for his ascites. May need to re-image in a few days to see if he needs another paracentesis. Discussed with pt that paracentesis would be repeated only if he develops difficulty with breathing or early satiety. 07-31-2023 pt with moderate amount of ascites again. Pt states abd distension is getting more uncomfortable again. Will ask IR for another large volume paracentesis tomorrow. 08-01-2023 for repeat paracentesis today. Large volume. Remove as much fluid as possible. RD consult to increase protein intake in his diet. Prn IV albumin  ordered after paracentesis. Defer to GI service to start aldactone  as they see fit. 08-02-2023 s/p 7 liter paracentesis yesterday. Received 50 g  IV albumin  afterwards. GI concerned about ascites coming from pancreas. On SQ octreotide  q8h. Ascitic fluid yesterday NOT consistent with SBP. Started on po lasix  and po aldcatone per GI 08-03-2023 Advised he needs to consume 1-1.5 g/kg of  protein each day. Bedside abd U/S shows minimal ascites in RLQ and LLQ. No ascites in RUQ/LUQ. Definitely not enough ascites to safely perform paracentesis. Ascitic fluid lipase was 25,473 and ascitic fluid amylase of 4478. Pancreas is likely the source of his ascites. Awaiting further f/u from GI service.Pt remains on SQ octreotide  TID per GI service  08-04-2023 still awaiting for GI service to address pt's continued need for SQ octreotide . Pt with slowly developing more ascites. Last paracentesis was on 08-01-2023. Will increase lasix  60 mg bid and keep aldactone  at 100 mg daily.  Alcoholic cirrhosis of liver with ascites (HCC) 07-31-2023 his cirrhosis workup thus far has been unrevealing. 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  Defer to GI if he needs EGD to evaluate for portal gastropathy. Defer to GI if pt needs SBP prophylaxis, aldactone . Pt already on atenolol  for his HTN. Can change over to coreg/nadolol/lopressor /propranolol if desired by GI. 08-01-2023 still somewhat tachy. BP has some room to increase atenolol . Will increase to 37.5 mg bid. 08-02-2023 HR right at 100. On atenolol  37.5 mg bid. No further room in BP to increase. Now on lasix  and aldcatone as well. Monitor BP.    Alcohol-induced acute pancreatitis without infection or necrosis 07-29-2023 only mild elevation of lipase. Stable. 07-30-2023 stable lipase. 07-31-2023 MRCP shows some cyst in his pancreas. 08-01-2023 pt to remain abstinent from all etoh use. 08-03-2023 appears his ascites is related to his pancreas. Awaiting for GI service to address his SQ octreotide . 08-04-2023 awaiting GI decision on SQ octreotide  to continue at discharge.  Alcohol use disorder 07-29-2023 continue with CIWA protocol. Pt admits to drinking 1/5 liquor per day. 07-30-2023 2 prn ativans given in the last 24 hours. 07-31-2023 2 prn ativans given since I last saw patient yesterday. Can stop  CIWA soon. 08-01-2023 off CIWA protocol. Outside window now for Dts. 08-02-2023 has some anxiety/withdrawal yesterday. Started on prn librium . 08-03-2023 continue prn librium   Protein-calorie malnutrition, severe 08/01/2023 per RD note from today. Severe Malnutrition related to chronic illness as evidenced by severe muscle depletion, energy intake < or equal to 75% for > or equal to 1 month, mild fat depletion.   Essential hypertension 07-29-2023 start atenolol  12.5 mg bid. 07-30-2023 BP still elevated. Will increase atenolol  to 25 mg bid. 07-31-2023 continue with current dose of atenolol  25 mg bid. 08-01-2023 increase atenolol  to 37.5 mg bid to help with HR 100s. 08-02-2023 HR right at 100. On atenolol  37.5 mg bid. No further room in BP to increase. Now on lasix  and aldcatone as well. Monitor BP.  08-03-2023 stable on atenolol  37.5 bid.  08-04-2023 stable on atenolol  37.5 mg bid. Increasing lasix  to 60 mg bid due to re-accumulating ascites. On aldactone  100 mg daily.  Hypokalemia 07-29-2023 replete with po kcl 07-30-2023 resolved. K of 3.6 07-31-2023 give more po kcl. 08-01-2023 resolved with replacement.  Hyponatremia 07-29-2023 likely due to alcohol intake and very little solute intake. Asymptomatic at this point. Follow with daily BMP. 07-30-2023 Na down to 127. Will check serum osm, urine osm, urine electrolytes. My intuition says that his hyponatremia is from low solute intake rather than SIADH. TSH is normal at 1.68. repeat BMP in AM. 07-31-2023  unfortunately, his urine electrolytes not colleted when his sodium was low. Now his Na is up to 133. He is eating better last 24 hours. All this points to low solute intake as cause of his hyponatremia and not volume overload or SIADH 08-01-2023 down today due to NPO for paracentesis. Due to low solute intake. 08-02-2023 stable. Monitor now that he is on lasix  and aldactone . 08-03-2023 stable. Na of 129. On po lasix  and  aldactone .  08-04-2023 Na 131. Continue to monitor as lasix  will be increased to 60 mg bid. Continue aldactone  100 mg qday.  Tobacco abuse 07-31-2023 continue with nicotine  patch.   DVT prophylaxis: Place and maintain sequential compression device Start: 07/28/23 2017    Code Status: Full Code Family Communication: no family at bedside Disposition Plan: return home Reason for continuing need for hospitalization: monitoring for re-accumulation of ascites. Remains on SQ octreotide .  Objective: Vitals:   08/03/23 2020 08/04/23 0317 08/04/23 0317 08/04/23 0727  BP: 108/68  105/69 108/70  Pulse: 98  91 91  Resp: 16  16 17   Temp: 98.7 F (37.1 C)  98.8 F (37.1 C) 98.9 F (37.2 C)  TempSrc: Oral  Oral Oral  SpO2: 97%  97% 95%  Weight:  78.6 kg    Height:        Intake/Output Summary (Last 24 hours) at 08/04/2023 1049 Last data filed at 08/03/2023 2100 Gross per 24 hour  Intake 480 ml  Output --  Net 480 ml   Filed Weights   08/02/23 0500 08/03/23 0437 08/04/23 0317  Weight: 76.9 kg 76.9 kg 78.6 kg    Examination:  Physical Exam Vitals and nursing note reviewed.  Constitutional:      General: He is not in acute distress.    Appearance: He is not toxic-appearing.  HENT:     Head: Normocephalic and atraumatic.     Nose: Nose normal.  Eyes:     General: No scleral icterus. Cardiovascular:     Rate and Rhythm: Normal rate and regular rhythm.  Pulmonary:     Effort: Pulmonary effort is normal.  Abdominal:     General: Bowel sounds are normal.     Palpations: Abdomen is soft.     Comments: Round  Bedside abd U/S shows LLQ ascites. More than yesterday. No window for paracentesis.  Abd seems fuller than yesterday.  Musculoskeletal:     Right lower leg: No edema.     Left lower leg: No edema.  Skin:    General: Skin is warm and dry.     Capillary Refill: Capillary refill takes less than 2 seconds.  Neurological:     General: No focal deficit present.     Mental  Status: He is alert and oriented to person, place, and time.     Data Reviewed: I have personally reviewed following labs and imaging studies  CBC: Recent Labs  Lab 07/28/23 1135 07/29/23 0606 07/30/23 0344 07/31/23 0810  WBC 12.7* 12.8* 11.6* 9.9  NEUTROABS 10.1*  --   --   --   HGB 13.3 11.7* 10.5* 11.2*  HCT 38.5* 34.1* 31.7* 33.5*  MCV 92.1 90.2 92.7 92.8  PLT 326 273 296 361   Basic Metabolic Panel: Recent Labs  Lab 07/28/23 1629 07/28/23 2058 07/31/23 0810 08/01/23 0248 08/02/23 0611 08/03/23 0801 08/04/23 0636  NA  --    < > 133* 129* 129* 129* 131*  K  --    < > 3.4* 4.0 4.4 4.1 3.6  CL  --    < > 96* 95* 93* 93* 93*  CO2  --    < > 27 25 24 25 27   GLUCOSE  --    < > 114* 107* 144* 154* 156*  BUN  --    < > 8 8 9 12 10   CREATININE  --    < > 0.76 0.78 0.72 0.85 0.77  CALCIUM  --    < > 7.5* 7.3* 7.8* 7.8* 7.8*  MG 1.8  --   --   --   --  1.7 1.8   < > = values in this interval not displayed.   GFR: Estimated Creatinine Clearance: 135.1 mL/min (by C-G formula based on SCr of 0.77 mg/dL). Liver Function Tests: Recent Labs  Lab 07/31/23 0810 08/01/23 0248 08/02/23 0611 08/03/23 0801 08/04/23 0636  AST 45* 52* 29 26 21   ALT 22 23 17 17 14   ALKPHOS 235* 205* 153* 131* 110  BILITOT 1.1 0.9 0.9 0.8 0.7  PROT 5.1* 5.2* 5.1* 5.6* 5.4*  ALBUMIN  1.6* 1.5* 1.9* 1.9* 1.7*   Recent Labs  Lab 07/28/23 1629 07/30/23 0344  LIPASE 176*  182* 164*   Recent Labs  Lab 07/28/23 1629  AMMONIA 41*   Coagulation Profile: Recent Labs  Lab 07/28/23 1629 07/29/23 0606 07/31/23 0810  INR 1.2 1.3* 1.4*   BNP (last 3 results) Recent Labs    07/28/23 1629  BNP 23.4   CBG: Recent Labs  Lab 07/30/23 0746 08/01/23 0827 08/02/23 0810 08/03/23 0754 08/04/23 0728  GLUCAP 120* 90 137* 149* 168*   Sepsis Labs: Recent Labs  Lab 07/28/23 1710  LATICACIDVEN 2.3*    Recent Results (from the past 240 hours)  Blood culture (routine x 2)     Status: None    Collection Time: 07/28/23  3:25 PM   Specimen: BLOOD  Result Value Ref Range Status   Specimen Description BLOOD SITE NOT SPECIFIED  Final   Special Requests   Final    BOTTLES DRAWN AEROBIC AND ANAEROBIC Blood Culture results may not be optimal due to an inadequate volume of blood received in culture bottles   Culture   Final    NO GROWTH 5 DAYS Performed at [facility], [address], [place], [place] [address]    Report Status 08/02/2023 FINAL  Final  Blood culture (routine x 2)     Status: None   Collection Time: 07/28/23  8:58 PM   Specimen: BLOOD RIGHT HAND  Result Value Ref Range Status   Specimen Description BLOOD RIGHT HAND  Final   Special Requests   Final    BOTTLES DRAWN AEROBIC AND ANAEROBIC Blood Culture results may not be optimal due to an excessive volume of blood received in culture bottles   Culture   Final    NO GROWTH 5 DAYS Performed at [facility], [address], [place], [place] [address]    Report Status 08/02/2023 FINAL  Final  Culture, body fluid w Gram Stain-bottle     Status: None   Collection Time: 07/29/23  8:15 AM   Specimen: Peritoneal Washings  Result Value Ref Range Status   Specimen Description PERITONEAL  Final   Special Requests NONE  Final   Culture   Final    NO GROWTH 5 DAYS Performed at [facility], [address], [place], [place] [address]    Report Status 08/03/2023 FINAL  Final  Gram stain  Status: None   Collection Time: 07/29/23  8:15 AM   Specimen: Peritoneal Washings  Result Value Ref Range Status   Specimen Description PERITONEAL  Final   Special Requests NONE  Final   Gram Stain   Final    WBC SEEN NO ORGANISMS SEEN CYTOSPIN SMEAR Performed at [facility], [address], [place], [place] [address]    Report Status 07/29/2023 FINAL  Final  Culture, body fluid w Gram Stain-bottle     Status: None (Preliminary result)   Collection Time: 08/01/23 12:49 PM   Specimen: Peritoneal  Washings  Result Value Ref Range Status   Specimen Description PERITONEAL  Final   Special Requests NONE  Final   Culture   Final    NO GROWTH 3 DAYS Performed at [facility], [address] [place], [place] [address]    Report Status PENDING  Incomplete  Gram stain     Status: None   Collection Time: 08/01/23 12:49 PM   Specimen: Peritoneal Washings  Result Value Ref Range Status   Specimen Description PERITONEAL  Final   Special Requests NONE  Final   Gram Stain   Final    NO WBC SEEN NO ORGANISMS SEEN Performed at [facility], [address], [place], [place] [address]    Report Status 08/01/2023 FINAL  Final     Radiology Studies: No results found.  Scheduled Meds:  atenolol   37.5 mg Oral BID   feeding supplement  237 mL Oral TID BM   folic acid   1 mg Oral Daily   furosemide   60 mg Oral BID   lidocaine   1 patch Transdermal Q24H   lipase/protease/amylase  36,000 Units Oral TID WC   lipase/protease/amylase  36,000 Units Oral BID BM   multivitamin with minerals  1 tablet Oral Daily   nicotine   21 mg Transdermal Daily   octreotide   100 mcg Subcutaneous Q8H   spironolactone   100 mg Oral Daily   thiamine   100 mg Oral Daily   Or   thiamine   100 mg Intravenous Daily   Continuous Infusions:   LOS: 6 days   Time spent: 40 minutes  Camellia Door, DO  [facility]  08/04/2023, 10:49 AM

## 2023-08-05 ENCOUNTER — Other Ambulatory Visit (HOSPITAL_COMMUNITY): Payer: Self-pay

## 2023-08-05 ENCOUNTER — Telehealth (HOSPITAL_COMMUNITY): Payer: Self-pay | Admitting: Pharmacy Technician

## 2023-08-05 ENCOUNTER — Telehealth: Payer: Self-pay

## 2023-08-05 DIAGNOSIS — E440 Moderate protein-calorie malnutrition: Secondary | ICD-10-CM

## 2023-08-05 DIAGNOSIS — I10 Essential (primary) hypertension: Secondary | ICD-10-CM | POA: Diagnosis not present

## 2023-08-05 DIAGNOSIS — F1010 Alcohol abuse, uncomplicated: Secondary | ICD-10-CM

## 2023-08-05 DIAGNOSIS — K7031 Alcoholic cirrhosis of liver with ascites: Secondary | ICD-10-CM | POA: Diagnosis not present

## 2023-08-05 DIAGNOSIS — K852 Alcohol induced acute pancreatitis without necrosis or infection: Secondary | ICD-10-CM | POA: Diagnosis not present

## 2023-08-05 DIAGNOSIS — K7011 Alcoholic hepatitis with ascites: Secondary | ICD-10-CM

## 2023-08-05 LAB — COMPREHENSIVE METABOLIC PANEL
ALT: 15 U/L (ref 0–44)
AST: 28 U/L (ref 15–41)
Albumin: 1.7 g/dL — ABNORMAL LOW (ref 3.5–5.0)
Alkaline Phosphatase: 128 U/L — ABNORMAL HIGH (ref 38–126)
Anion gap: 10 (ref 5–15)
BUN: 9 mg/dL (ref 6–20)
CO2: 30 mmol/L (ref 22–32)
Calcium: 8 mg/dL — ABNORMAL LOW (ref 8.9–10.3)
Chloride: 91 mmol/L — ABNORMAL LOW (ref 98–111)
Creatinine, Ser: 0.85 mg/dL (ref 0.61–1.24)
GFR, Estimated: >60 mL/min (ref >60)
Glucose, Bld: 194 mg/dL — ABNORMAL HIGH (ref 70–99)
Potassium: 3.6 mmol/L (ref 3.5–5.1)
Sodium: 131 mmol/L — ABNORMAL LOW (ref 135–145)
Total Bilirubin: 0.6 mg/dL (ref 0.0–1.2)
Total Protein: 5.7 g/dL — ABNORMAL LOW (ref 6.5–8.1)

## 2023-08-05 LAB — GLUCOSE, CAPILLARY: Glucose-Capillary: 180 mg/dL — ABNORMAL HIGH (ref 70–99)

## 2023-08-05 LAB — MAGNESIUM: Magnesium: 1.7 mg/dL (ref 1.7–2.4)

## 2023-08-05 MED ORDER — PANCRELIPASE (LIP-PROT-AMYL) 36000-114000 UNITS PO CPEP
36000.0000 [IU] | ORAL_CAPSULE | Freq: Three times a day (TID) | ORAL | 0 refills | Status: DC
  Filled 2023-08-05: qty 100, 30d supply, fill #0
  Filled 2023-09-04: qty 100, 30d supply, fill #1
  Filled 2023-09-30: qty 100, 30d supply, fill #2
  Filled 2023-10-02: qty 70, 24d supply, fill #2

## 2023-08-05 MED ORDER — OCTREOTIDE ACETATE 100 MCG/ML IJ SOLN
100.0000 ug | Freq: Three times a day (TID) | INTRAMUSCULAR | 0 refills | Status: AC
  Filled 2023-08-05: qty 14, 5d supply, fill #0

## 2023-08-05 MED ORDER — NALTREXONE HCL 50 MG PO TABS
50.0000 mg | ORAL_TABLET | Freq: Every day | ORAL | 0 refills | Status: DC
  Filled 2023-08-05: qty 30, 30d supply, fill #0
  Filled 2023-09-02: qty 30, 30d supply, fill #1
  Filled 2023-09-30: qty 30, 30d supply, fill #2

## 2023-08-05 MED ORDER — FOLIC ACID 1 MG PO TABS
1.0000 mg | ORAL_TABLET | Freq: Every day | ORAL | 0 refills | Status: AC
  Filled 2023-08-05: qty 30, 30d supply, fill #0
  Filled 2023-09-02: qty 30, 30d supply, fill #1
  Filled 2023-09-30: qty 30, 30d supply, fill #2

## 2023-08-05 MED ORDER — ADULT MULTIVITAMIN W/MINERALS CH
1.0000 | ORAL_TABLET | Freq: Every day | ORAL | 0 refills | Status: AC
  Filled 2023-08-05: qty 90, 90d supply, fill #0

## 2023-08-05 MED ORDER — FUROSEMIDE 40 MG PO TABS
60.0000 mg | ORAL_TABLET | Freq: Two times a day (BID) | ORAL | 0 refills | Status: DC
  Filled 2023-08-05: qty 90, 30d supply, fill #0
  Filled 2023-09-02: qty 90, 30d supply, fill #1
  Filled 2023-09-30: qty 90, 30d supply, fill #2

## 2023-08-05 MED ORDER — THIAMINE HCL 100 MG PO TABS
100.0000 mg | ORAL_TABLET | Freq: Every day | ORAL | 0 refills | Status: AC
  Filled 2023-08-05: qty 90, 90d supply, fill #0

## 2023-08-05 MED ORDER — SPIRONOLACTONE 100 MG PO TABS
100.0000 mg | ORAL_TABLET | Freq: Every morning | ORAL | 0 refills | Status: DC
  Filled 2023-08-05: qty 30, 30d supply, fill #0
  Filled 2023-09-02: qty 30, 30d supply, fill #1
  Filled 2023-09-30: qty 30, 30d supply, fill #2

## 2023-08-05 MED ORDER — METOPROLOL SUCCINATE ER 50 MG PO TB24
75.0000 mg | ORAL_TABLET | Freq: Every day | ORAL | 0 refills | Status: DC
  Filled 2023-08-05: qty 45, 30d supply, fill #0
  Filled 2023-09-02: qty 45, 30d supply, fill #1
  Filled 2023-09-30: qty 45, 30d supply, fill #2

## 2023-08-05 MED ORDER — NICOTINE 21 MG/24HR TD PT24
21.0000 mg | MEDICATED_PATCH | Freq: Every day | TRANSDERMAL | 0 refills | Status: DC
  Filled 2023-08-05: qty 28, 28d supply, fill #0

## 2023-08-05 NOTE — Telephone Encounter (Signed)
 Will scheduled patient on 08/30/23 at 11 am with Alcide Evener, NP once appt is released on 08/23/23. Victorino Dike is aware.

## 2023-08-05 NOTE — Telephone Encounter (Signed)
 Pharmacy Patient Advocate Encounter   Received notification that prior authorization for Octreotide  Acetate 100MCG/ML syringes is required/requested.   Insurance verification completed.   The patient is insured through [employer] .   Per test claim: PA required; PA submitted to above mentioned insurance via CoverMyMeds Key/confirmation #[id number] CHARTER COMMUNICATIONS Status is pending

## 2023-08-05 NOTE — Discharge Summary (Signed)
 [facility] Physician Discharge Summary   Patient name: Juan Crawford  Admit date:     07/28/2023  Discharge date: 08/05/2023  Attending Physician: LAURENCE, Bartholomew Ramesh [[id number]]  Discharge Physician: Camellia Laurence   PCP: Valma Lannie LABOR, PA-C  Admitted From: Home  Disposition:  Home  Recommendations for Outpatient Follow-up:  Follow up with PCP in 1-2 weeks Follow up with [facility] in 2-4 weeks. Please follow up on the following pending results:  Home Health:No Equipment/Devices: None    Discharge Condition:Stable CODE STATUS:FULL Diet recommendation: Heart Healthy Fluid Restriction: None  Hospital Summary: HPI: Juan Crawford is a 43 y.o. male with medical history significant of alcohol use disorder and hypertension presenting with abdominal and leg swelling for the past 3 weeks.   He reports that he first noticed abdominal swelling about 3 weeks ago.  His abdominal swelling progressively became worse over the past couple of weeks and he began developing bilateral leg swelling as well.  He came to the emergency department as his abdominal and leg swelling was not going away on its own.  He does report history of alcohol use, drinks about 1/5 of liquor daily.  His last drink was around 4 AM this morning.  He denies any nausea, vomiting, yellowing of his skin, chest pain, palpitations, shortness of breath, abdominal pain, diarrhea, constipation, urinary changes.  He denies any history of delirium tremens.   ED course: Initial vital signs with tachycardia and elevated BP, but he is afebrile and saturating well on room air.  CBC showing leukocytosis but otherwise with stable hemoglobin and platelet count.  CMP showing hyponatremia, hypokalemia, elevated AST and alk phos, hypoalbuminemia, normal kidney function.  PT mildly elevated with normal INR.  Alcohol level elevated.  Lipase elevated at 182.  Lactate 2.1.  Ammonia 41.  BNP normal.  CT abdomen pelvis showing mild pancreatitis,  fatty infiltration with hepatomegaly, and significant ascites.  Right upper quadrant ultrasound showing fatty liver and moderate ascites.  ED provider has reached out to IR who will perform paracentesis tomorrow.  ED provider also consulted GI, Dr. Wilhelmenia, who will evaluate patient tomorrow.  [facility] asked to evaluate patient for admission.    Significant Events: Admitted 07/28/2023 for new onset ascites   Significant Labs: Lipase elevated at 182. Lactate 2.1. Ammonia 41, ETOH 180  Significant Imaging Studies: CT abdomen pelvis showing mild pancreatitis, fatty infiltration with hepatomegaly, and significant ascites.  Right upper quadrant ultrasound showing fatty liver and moderate ascites.   Antibiotic Therapy: Anti-infectives (From admission, onward)    None       Procedures: 07-29-2023 Paracentesis for 4 liters 08-01-2023 paracentesis for 7 liters  Consultants: GI   Hospital Course by Problem: * Ascites 07-29-2023 s/p 4 liter paracentesis today. Ascites fluid does not meet criteria for SBP.  No abx are indicated at this time. 07-30-2023 GI continues workup for his ascites. May need to re-image in a few days to see if he needs another paracentesis. Discussed with pt that paracentesis would be repeated only if he develops difficulty with breathing or early satiety. 07-31-2023 pt with moderate amount of ascites again. Pt states abd distension is getting more uncomfortable again. Will ask IR for another large volume paracentesis tomorrow. 08-01-2023 for repeat paracentesis today. Large volume. Remove as much fluid as possible. RD consult to increase protein intake in his diet. Prn IV albumin  ordered after paracentesis. Defer to GI service to start aldactone  as they see fit. 08-02-2023 s/p 7 liter paracentesis  yesterday. Received 50 g IV albumin  afterwards. GI concerned about ascites coming from pancreas. On SQ octreotide  q8h. Ascitic fluid yesterday NOT consistent with  SBP. Started on po lasix  and po aldcatone per GI 08-03-2023 Advised he needs to consume 1-1.5 g/kg of protein each day. Bedside abd U/S shows minimal ascites in RLQ and LLQ. No ascites in RUQ/LUQ. Definitely not enough ascites to safely perform paracentesis. Ascitic fluid lipase was 25,473 and ascitic fluid amylase of 4478. Pancreas is likely the source of his ascites. Awaiting further f/u from GI service.Pt remains on SQ octreotide  TID per GI service 08-04-2023 still awaiting for GI service to address pt's continued need for SQ octreotide . Pt with slowly developing more ascites. Last paracentesis was on 08-01-2023. Will increase lasix  60 mg bid and keep aldactone  at 100 mg daily.  08-05-2023 remains on SQ TID octreotide . Awaiting GI to decide on what to do about this medication. Increased dose/frequency of lasix  (60 mg bid) along with aldactone  100 mg qday has increased his urine output. He states his abd is not any more distended than yesterday. *update. GI wants him to go home on SQ TID octreotide  for 2 weeks. F/u with GI in 4 weeks.  Alcoholic cirrhosis of liver with ascites (HCC) 07-31-2023 his cirrhosis workup thus far has been unrevealing. 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  Defer to GI if he needs EGD to evaluate for portal gastropathy. Defer to GI if pt needs SBP prophylaxis, aldactone . Pt already on atenolol  for his HTN. Can change over to coreg/nadolol/lopressor /propranolol if desired by GI. 08-01-2023 still somewhat tachy. BP has some room to increase atenolol . Will increase to 37.5 mg bid. 08-02-2023 HR right at 100. On atenolol  37.5 mg bid. No further room in BP to increase. Now on lasix  and aldcatone as well. Monitor BP.  08-05-2023 stable on atenolol  37.5 mg bid, lasix  60 mg bid, aldactone  100 mg qday. Could change atenolol  to Toprol -XL 75 mg daily at discharge to ease pill burden.    Alcohol-induced acute pancreatitis  without infection or necrosis 07-29-2023 only mild elevation of lipase. Stable. 07-30-2023 stable lipase. 07-31-2023 MRCP shows some cyst in his pancreas. 08-01-2023 pt to remain abstinent from all etoh use. 08-03-2023 appears his ascites is related to his pancreas. Awaiting for GI service to address his SQ octreotide . 08-04-2023 awaiting GI decision on SQ octreotide  to continue at discharge.  08-05-2023 awaiting GI decision on SQ octreotide  to continue at discharge.  Alcohol use disorder 07-29-2023 continue with CIWA protocol. Pt admits to drinking 1/5 liquor per day. 07-30-2023 2 prn ativans given in the last 24 hours. 07-31-2023 2 prn ativans given since I last saw patient yesterday. Can stop CIWA soon. 08-01-2023 off CIWA protocol. Outside window now for Dts. 08-02-2023 has some anxiety/withdrawal yesterday. Started on prn librium . 08-03-2023 continue prn librium  08-05-2023 has not used prn librium  in several days. Pt interested in po naltrexone  at discharge. Discussed that he could not start it for about 5-7 days since he has been getting prn oxycodone . He will not be going home with oxycodone .  Protein-calorie malnutrition, severe 08/01/2023 per RD note from today. Severe Malnutrition related to chronic illness as evidenced by severe muscle depletion, energy intake < or equal to 75% for > or equal to 1 month, mild fat depletion.   Essential hypertension 07-29-2023 start atenolol  12.5 mg bid. 07-30-2023 BP still elevated. Will increase atenolol  to 25 mg bid. 07-31-2023 continue with  current dose of atenolol  25 mg bid. 08-01-2023 increase atenolol  to 37.5 mg bid to help with HR 100s. 08-02-2023 HR right at 100. On atenolol  37.5 mg bid. No further room in BP to increase. Now on lasix  and aldcatone as well. Monitor BP.  08-03-2023 stable on atenolol  37.5 bid.  08-04-2023 stable on atenolol  37.5 mg bid. Increasing lasix  to 60 mg bid due to re-accumulating ascites. On aldactone  100 mg  daily.  08-05-2023 stable on atenolol  37.5 mg bid, lasix  60 mg bid, aldactone  100 mg qday. Could change atenolol  to Toprol -XL 75 mg daily at discharge to ease pill burden.   Hypokalemia 07-29-2023 replete with po kcl 07-30-2023 resolved. K of 3.6 07-31-2023 give more po kcl. 08-01-2023 resolved with replacement.  Hyponatremia 07-29-2023 likely due to alcohol intake and very little solute intake. Asymptomatic at this point. Follow with daily BMP. 07-30-2023 Na down to 127. Will check serum osm, urine osm, urine electrolytes. My intuition says that his hyponatremia is from low solute intake rather than SIADH. TSH is normal at 1.68. repeat BMP in AM. 07-31-2023 unfortunately, his urine electrolytes not colleted when his sodium was low. Now his Na is up to 133. He is eating better last 24 hours. All this points to low solute intake as cause of his hyponatremia and not volume overload or SIADH 08-01-2023 down today due to NPO for paracentesis. Due to low solute intake. 08-02-2023 stable. Monitor now that he is on lasix  and aldactone . 08-03-2023 stable. Na of 129. On po lasix  and aldactone .  08-04-2023 Na 131. Continue to monitor as lasix  will be increased to 60 mg bid. Continue aldactone  100 mg qday. 08-05-2023 Na stable at 131.  Tobacco abuse 07-31-2023 continue with nicotine  patch.    Discharge Diagnoses:  Principal Problem:   Ascites Active Problems:   Alcohol-induced acute pancreatitis without infection or necrosis   Alcoholic cirrhosis of liver with ascites (HCC)   Alcohol use disorder   Tobacco abuse   Hyponatremia   Hypokalemia   Essential hypertension   Alcohol abuse   Protein-calorie malnutrition, severe   Acute pancreatitis without infection or necrosis   Discharge Instructions  Discharge Instructions     Call MD for:  difficulty breathing, headache or visual disturbances   Complete by: As directed    Call MD for:  extreme fatigue   Complete by: As directed     Call MD for:  hives   Complete by: As directed    Call MD for:  persistant dizziness or light-headedness   Complete by: As directed    Call MD for:  persistant nausea and vomiting   Complete by: As directed    Call MD for:  redness, tenderness, or signs of infection (pain, swelling, redness, odor or green/yellow discharge around incision site)   Complete by: As directed    Call MD for:  severe uncontrolled pain   Complete by: As directed    Call MD for:  temperature >100.4   Complete by: As directed    Diet - low sodium heart healthy   Complete by: As directed    Discharge instructions   Complete by: As directed    1. Follow up with [facility] on 08/30/23 at 11 am. Call office at [phone] to confirm appointment. 2. Do NOT consume any alcohol ever again.   Increase activity slowly   Complete by: As directed       Allergies as of 08/05/2023   No Known Allergies  Medication List     STOP taking these medications    amLODipine  10 MG tablet Commonly known as: NORVASC        TAKE these medications    folic acid  1 MG tablet Commonly known as: FOLVITE  Take 1 tablet (1 mg total) by mouth daily. Start taking on: August 06, 2023   furosemide  40 MG tablet Commonly known as: LASIX  Take 1.5 tablets (60 mg total) by mouth 2 (two) times daily.   lipase/protease/amylase 63999 UNITS Cpep capsule Commonly known as: CREON  Take 1 capsule (36,000 Units total) by mouth 3 (three) times daily with meals.   metoprolol  succinate 50 MG 24 hr tablet Commonly known as: Toprol  XL Take 1.5 tablets (75 mg total) by mouth daily.   multivitamin with minerals Tabs tablet Take 1 tablet by mouth daily. Start taking on: August 06, 2023   naltrexone  50 MG tablet Commonly known as: DEPADE Take 1 tablet (50 mg total) by mouth daily. Start taking on: August 11, 2023   nicotine  21 mg/24hr patch Commonly known as: NICODERM CQ  - dosed in mg/24 hours Place 1 patch (21 mg total) onto the  skin daily. Start taking on: August 06, 2023   octreotide  100 MCG/ML Soln injection Commonly known as: SANDOSTATIN  Inject 1 mL (100 mcg total) into the skin every 8 (eight) hours for 14 days.   spironolactone  100 MG tablet Commonly known as: ALDACTONE  Take 1 tablet (100 mg total) by mouth in the morning.   thiamine  100 MG tablet Commonly known as: VITAMIN B1 Take 1 tablet (100 mg total) by mouth daily. Start taking on: August 06, 2023        No Known Allergies  Discharge Exam: Vitals:   08/05/23 0431 08/05/23 0745  BP: 107/82 111/79  Pulse: 84 91  Resp: 16 17  Temp: 98.5 F (36.9 C) 98.2 F (36.8 C)  SpO2: 98% 98%    Physical Exam Vitals and nursing note reviewed.  Constitutional:      General: He is not in acute distress.    Appearance: Normal appearance. He is not toxic-appearing or diaphoretic.  HENT:     Head: Normocephalic and atraumatic.     Nose: Nose normal.  Cardiovascular:     Rate and Rhythm: Normal rate and regular rhythm.  Pulmonary:     Effort: Pulmonary effort is normal.     Breath sounds: Normal breath sounds.  Abdominal:     General: Bowel sounds are normal.     Palpations: Abdomen is soft.     Tenderness: There is no abdominal tenderness. There is no guarding or rebound.  Musculoskeletal:     Right lower leg: No edema.     Left lower leg: No edema.  Skin:    General: Skin is warm and dry.     Capillary Refill: Capillary refill takes less than 2 seconds.  Neurological:     General: No focal deficit present.     Mental Status: He is alert and oriented to person, place, and time.     The results of significant diagnostics from this hospitalization (including imaging, microbiology, ancillary and laboratory) are listed below for reference.    Microbiology: Recent Results (from the past 240 hours)  Blood culture (routine x 2)     Status: None   Collection Time: 07/28/23  3:25 PM   Specimen: BLOOD  Result Value Ref Range Status    Specimen Description BLOOD SITE NOT SPECIFIED  Final   Special Requests   Final  BOTTLES DRAWN AEROBIC AND ANAEROBIC Blood Culture results may not be optimal due to an inadequate volume of blood received in culture bottles   Culture   Final    NO GROWTH 5 DAYS Performed at [facility], [address] [place], [place] [address]    Report Status 08/02/2023 FINAL  Final  Blood culture (routine x 2)     Status: None   Collection Time: 07/28/23  8:58 PM   Specimen: BLOOD RIGHT HAND  Result Value Ref Range Status   Specimen Description BLOOD RIGHT HAND  Final   Special Requests   Final    BOTTLES DRAWN AEROBIC AND ANAEROBIC Blood Culture results may not be optimal due to an excessive volume of blood received in culture bottles   Culture   Final    NO GROWTH 5 DAYS Performed at [facility], [address] [place], [place] [address]    Report Status 08/02/2023 FINAL  Final  Culture, body fluid w Gram Stain-bottle     Status: None   Collection Time: 07/29/23  8:15 AM   Specimen: Peritoneal Washings  Result Value Ref Range Status   Specimen Description PERITONEAL  Final   Special Requests NONE  Final   Culture   Final    NO GROWTH 5 DAYS Performed at [facility], [address] [place], [place] [address]    Report Status 08/03/2023 FINAL  Final  Gram stain     Status: None   Collection Time: 07/29/23  8:15 AM   Specimen: Peritoneal Washings  Result Value Ref Range Status   Specimen Description PERITONEAL  Final   Special Requests NONE  Final   Gram Stain   Final    WBC SEEN NO ORGANISMS SEEN CYTOSPIN SMEAR Performed at [facility], [address], [place], [place] [address]    Report Status 07/29/2023 FINAL  Final  Culture, body fluid w Gram Stain-bottle     Status: None (Preliminary result)   Collection Time: 08/01/23 12:49 PM   Specimen: Peritoneal Washings  Result Value Ref Range Status   Specimen Description PERITONEAL  Final   Special  Requests NONE  Final   Culture   Final    NO GROWTH 4 DAYS Performed at [facility], [address], [place], [place] [address]    Report Status PENDING  Incomplete  Gram stain     Status: None   Collection Time: 08/01/23 12:49 PM   Specimen: Peritoneal Washings  Result Value Ref Range Status   Specimen Description PERITONEAL  Final   Special Requests NONE  Final   Gram Stain   Final    NO WBC SEEN NO ORGANISMS SEEN Performed at [facility], [address], [place], [place] [address]    Report Status 08/01/2023 FINAL  Final     Labs: BNP (last 3 results) Recent Labs    07/28/23 1629  BNP 23.4   Basic Metabolic Panel: Recent Labs  Lab 08/01/23 0248 08/02/23 0611 08/03/23 0801 08/04/23 0636 08/05/23 0538  NA 129* 129* 129* 131* 131*  K 4.0 4.4 4.1 3.6 3.6  CL 95* 93* 93* 93* 91*  CO2 25 24 25 27 30   GLUCOSE 107* 144* 154* 156* 194*  BUN 8 9 12 10 9   CREATININE 0.78 0.72 0.85 0.77 0.85  CALCIUM 7.3* 7.8* 7.8* 7.8* 8.0*  MG  --   --  1.7 1.8 1.7   Liver Function Tests:  Recent Labs  Lab 08/01/23 0248 08/02/23 0611 08/03/23 0801 08/04/23 0636 08/05/23 0538  AST 52* 29 26 21 28   ALT 23 17 17 14 15   ALKPHOS 205* 153* 131* 110 128*  BILITOT 0.9 0.9 0.8 0.7 0.6  PROT 5.2* 5.1* 5.6* 5.4* 5.7*  ALBUMIN  1.5* 1.9* 1.9* 1.7* 1.7*   Recent Labs  Lab 07/30/23 0344  LIPASE 164*    CBC: Recent Labs  Lab 07/30/23 0344 07/31/23 0810  WBC 11.6* 9.9  HGB 10.5* 11.2*  HCT 31.7* 33.5*  MCV 92.7 92.8  PLT 296 361   CBG: Recent Labs  Lab 08/01/23 0827 08/02/23 0810 08/03/23 0754 08/04/23 0728 08/05/23 0743  GLUCAP 90 137* 149* 168* 180*   Urinalysis    Component Value Date/Time   COLORURINE YELLOW 12/23/2018 1211   APPEARANCEUR CLEAR 12/23/2018 1211   LABSPEC 1.010 12/23/2018 1211   PHURINE 7.0 12/23/2018 1211   GLUCOSEU NEGATIVE 12/23/2018 1211   HGBUR NEGATIVE 12/23/2018 1211   BILIRUBINUR NEGATIVE 12/23/2018 1211   KETONESUR  NEGATIVE 12/23/2018 1211   PROTEINUR NEGATIVE 12/23/2018 1211   NITRITE NEGATIVE 12/23/2018 1211   LEUKOCYTESUR NEGATIVE 12/23/2018 1211   Sepsis Labs Recent Labs  Lab 07/30/23 0344 07/31/23 0810  WBC 11.6* 9.9   Paracentesis Labs: Amylase, Body Fluid 4,478   Lipase-Fluid 41,643   Paracentesis Labs: Lab Results  Component Value Date/Time   FLUIDTYPE PERITONEAL FL 08/01/2023 12:25 PM   COLORFL BROWN (A) 08/01/2023 12:25 PM   APPEARANCEFL TURBID (A) 08/01/2023 12:25 PM   WBCFLUID 280 08/01/2023 12:25 PM   FNEUT 4 08/01/2023 12:25 PM   LYMPHSFL 1 08/01/2023 12:25 PM   MONOMACSERFL 95 (H) 08/01/2023 12:25 PM   EOSFL 0 08/01/2023 12:25 PM   OTHERCELLSBF OTHER CELLS IDENTIFIED AS MESOTHELIAL CELLS 08/01/2023 12:25 PM   ALBFL <1.5 08/01/2023 12:25 PM   SDES PERITONEAL 08/01/2023 12:49 PM   SDES PERITONEAL 08/01/2023 12:49 PM   SPECREQUEST NONE 08/01/2023 12:49 PM   SPECREQUEST NONE 08/01/2023 12:49 PM   GRAMSTAIN  08/01/2023 12:49 PM    NO WBC SEEN NO ORGANISMS SEEN Performed at [facility], [address] [place], [place] [address]    REPTSTATUS PENDING 08/01/2023 12:49 PM   REPTSTATUS 08/01/2023 FINAL 08/01/2023 12:49 PM   Paracentesis Cytology: FINAL MICROSCOPIC DIAGNOSIS:  - No malignant cells identified    Microbiology Recent Results (from the past 240 hours)  Blood culture (routine x 2)     Status: None   Collection Time: 07/28/23  3:25 PM   Specimen: BLOOD  Result Value Ref Range Status   Specimen Description BLOOD SITE NOT SPECIFIED  Final   Special Requests   Final    BOTTLES DRAWN AEROBIC AND ANAEROBIC Blood Culture results may not be optimal due to an inadequate volume of blood received in culture bottles   Culture   Final    NO GROWTH 5 DAYS Performed at [facility], [address] [place], [place] [address]    Report Status 08/02/2023 FINAL  Final  Blood culture (routine x 2)     Status: None   Collection Time: 07/28/23  8:58 PM    Specimen: BLOOD RIGHT HAND  Result Value Ref Range Status   Specimen Description BLOOD RIGHT HAND  Final   Special Requests   Final    BOTTLES DRAWN AEROBIC AND ANAEROBIC Blood Culture results may not be optimal due to an excessive volume of blood received in culture bottles   Culture  Final    NO GROWTH 5 DAYS Performed at [facility], [address] [place], [place] [address]    Report Status 08/02/2023 FINAL  Final  Culture, body fluid w Gram Stain-bottle     Status: None   Collection Time: 07/29/23  8:15 AM   Specimen: Peritoneal Washings  Result Value Ref Range Status   Specimen Description PERITONEAL  Final   Special Requests NONE  Final   Culture   Final    NO GROWTH 5 DAYS Performed at [facility], [address] [place], [place] [address]    Report Status 08/03/2023 FINAL  Final  Gram stain     Status: None   Collection Time: 07/29/23  8:15 AM   Specimen: Peritoneal Washings  Result Value Ref Range Status   Specimen Description PERITONEAL  Final   Special Requests NONE  Final   Gram Stain   Final    WBC SEEN NO ORGANISMS SEEN CYTOSPIN SMEAR Performed at [facility], [address] [place], [place] [address]    Report Status 07/29/2023 FINAL  Final  Culture, body fluid w Gram Stain-bottle     Status: None (Preliminary result)   Collection Time: 08/01/23 12:49 PM   Specimen: Peritoneal Washings  Result Value Ref Range Status   Specimen Description PERITONEAL  Final   Special Requests NONE  Final   Culture   Final    NO GROWTH 4 DAYS Performed at [facility], [address] [place], [place] [address]    Report Status PENDING  Incomplete  Gram stain     Status: None   Collection Time: 08/01/23 12:49 PM   Specimen: Peritoneal Washings  Result Value Ref Range Status   Specimen Description PERITONEAL  Final   Special Requests NONE  Final   Gram Stain   Final    NO WBC SEEN NO ORGANISMS SEEN Performed at [facility],  [address], [place], [place] [address]    Report Status 08/01/2023 FINAL  Final    Procedures/Studies: IR Paracentesis Result Date: 08/01/2023 INDICATION: 43 year old male with abdominal distention, ascites. Request made for diagnostic and therapeutic paracentesis. EXAM: ULTRASOUND GUIDED DIAGNOSTIC AND THERAPEUTIC PARACENTESIS MEDICATIONS: 10 mL 1% lidocaine  COMPLICATIONS: None immediate. PROCEDURE: Informed written consent was obtained from the patient after a discussion of the risks, benefits and alternatives to treatment. A timeout was performed prior to the initiation of the procedure. Initial ultrasound scanning demonstrates a large amount of ascites within the left lateral abdomen. The left lateral abdomen was prepped and draped in the usual sterile fashion. 1% lidocaine  was used for local anesthesia. Following this, a 19 gauge, 7-cm, Yueh catheter was introduced. An ultrasound image was saved for documentation purposes. The paracentesis was performed. The catheter was removed and a dressing was applied. The patient tolerated the procedure well without immediate post procedural complication. FINDINGS: A total of approximately 7.0 liters of brown/orange fluid was removed. Samples were sent to the laboratory as requested by the clinical team. IMPRESSION: Successful ultrasound-guided paracentesis yielding 7.0 liters of peritoneal fluid. Performed by: Kacie Matthews PA-C Electronically Signed   By: Ester Sides M.D.   On: 08/01/2023 15:29   MR ABDOMEN MRCP W WO CONTAST Result Date: 07/31/2023 CLINICAL DATA:  Pancreatitis. EXAM: MRI ABDOMEN WITHOUT AND WITH CONTRAST (INCLUDING MRCP) TECHNIQUE: Multiplanar multisequence MR imaging of the abdomen was performed both before and after the administration of intravenous contrast. Heavily T2-weighted images of  the biliary and pancreatic ducts were obtained, and three-dimensional MRCP images were rendered by post processing. CONTRAST:  8mL GADAVIST  GADOBUTROL  1  MMOL/ML IV SOLN COMPARISON:  CT 07/28/2023 FINDINGS: Lower chest: No acute findings. Hepatobiliary: Marked diffuse hepatic steatosis. There is squaring of the caudate lobe and widening of the falciform ligament. Contour of the liver appears slightly irregular and nodular. No focal enhancing liver abnormality. Mild pericholecystic fluid noted, nonspecific in the setting of ascites. No gallstones identified. The proximal common bile duct measures up to 8 mm, image 28/4. No signs of choledocholithiasis. Pancreas: No main duct dilatation. Diffuse low attenuation edema involving the body and tail of pancreas with surrounding soft tissue stranding. Cystic lesion within the head of pancreas appears uniformly T2 hyperintense measuring 0.9 x 0.6 cm, image 24/5. Adjacent to the tail of pancreas there is a small fluid collection measuring 1.7 x 1.1 cm, image 80/5. No signs pancreatic necrosis. No main duct dilatation or discrete mass. Spleen:  Within normal limits in size and appearance. Adrenals/Urinary Tract: Normal adrenal glands. Kidneys are unremarkable. Stomach/Bowel: Stomach appears within normal limits. No pathologic dilatation of the visualized bowel loops. Mild increase caliber scratch set mild wall thickening involving the visualized small bowel loops noted with wall thickness measuring up to 6 mm. Vascular/Lymphatic: Normal caliber abdominal aorta. The portal vein, portal venous confluence and SMV the are patent. 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 No abdominal adenopathy. Other: Large volume of ascites is identified within the abdomen. This ascites appears at least partially loculated with overlying enhancement of the peritoneal reflections, image 105/1304. Musculoskeletal: No suspicious bone lesions identified. IMPRESSION: 1. Imaging findings compatible with acute pancreatitis. No signs of pancreatic necrosis. 2. There are  2 fluid collections associated with the head and tail of pancreas. The largest is adjacent to the distal tail measuring 1.7 x 1.1 cm. Findings are favored to represent small pseudocysts. 3. Large volume of ascites is identified within the abdomen. This ascites appears at least partially loculated with overlying enhancement of the peritoneal reflections. Correlate for any clinical signs or symptoms of peritonitis. 4. Marked diffuse hepatic steatosis. There is squaring of the caudate lobe and widening of the falciform ligament. Contour of the liver appears slightly irregular and nodular. Findings are concerning for cirrhosis. 5. 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 6. Mild wall thickening involving the visualized small bowel loops noted with wall thickness measuring up to 6 mm. In the setting of cirrhosis and ascites is a nonspecific finding. Electronically Signed   By: Waddell Calk M.D.   On: 07/31/2023 09:23   MR 3D Recon At Scanner Result Date: 07/31/2023 CLINICAL DATA:  Pancreatitis. EXAM: MRI ABDOMEN WITHOUT AND WITH CONTRAST (INCLUDING MRCP) TECHNIQUE: Multiplanar multisequence MR imaging of the abdomen was performed both before and after the administration of intravenous contrast. Heavily T2-weighted images of the biliary and pancreatic ducts were obtained, and three-dimensional MRCP images were rendered by post processing. CONTRAST:  8mL GADAVIST  GADOBUTROL  1 MMOL/ML IV SOLN COMPARISON:  CT 07/28/2023 FINDINGS: Lower chest: No acute findings. Hepatobiliary: Marked diffuse hepatic steatosis. There is squaring of the caudate lobe and widening of the falciform ligament. Contour of the liver appears slightly irregular and nodular. No focal enhancing liver abnormality. Mild pericholecystic fluid noted, nonspecific in the setting of ascites. No gallstones identified. The proximal common bile duct measures up to 8 mm,  image 28/4. No  signs of choledocholithiasis. Pancreas: No main duct dilatation. Diffuse low attenuation edema involving the body and tail of pancreas with surrounding soft tissue stranding. Cystic lesion within the head of pancreas appears uniformly T2 hyperintense measuring 0.9 x 0.6 cm, image 24/5. Adjacent to the tail of pancreas there is a small fluid collection measuring 1.7 x 1.1 cm, image 80/5. No signs pancreatic necrosis. No main duct dilatation or discrete mass. Spleen:  Within normal limits in size and appearance. Adrenals/Urinary Tract: Normal adrenal glands. Kidneys are unremarkable. Stomach/Bowel: Stomach appears within normal limits. No pathologic dilatation of the visualized bowel loops. Mild increase caliber scratch set mild wall thickening involving the visualized small bowel loops noted with wall thickness measuring up to 6 mm. Vascular/Lymphatic: Normal caliber abdominal aorta. The portal vein, portal venous confluence and SMV the are patent. 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 No abdominal adenopathy. Other: Large volume of ascites is identified within the abdomen. This ascites appears at least partially loculated with overlying enhancement of the peritoneal reflections, image 105/1304. Musculoskeletal: No suspicious bone lesions identified. IMPRESSION: 1. Imaging findings compatible with acute pancreatitis. No signs of pancreatic necrosis. 2. There are 2 fluid collections associated with the head and tail of pancreas. The largest is adjacent to the distal tail measuring 1.7 x 1.1 cm. Findings are favored to represent small pseudocysts. 3. Large volume of ascites is identified within the abdomen. This ascites appears at least partially loculated with overlying enhancement of the peritoneal reflections. Correlate for any clinical signs or symptoms of peritonitis. 4. Marked diffuse hepatic steatosis. There is squaring of  the caudate lobe and widening of the falciform ligament. Contour of the liver appears slightly irregular and nodular. Findings are concerning for cirrhosis. 5. 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 6. Mild wall thickening involving the visualized small bowel loops noted with wall thickness measuring up to 6 mm. In the setting of cirrhosis and ascites is a nonspecific finding. Electronically Signed   By: Waddell Calk M.D.   On: 07/31/2023 09:23   ECHOCARDIOGRAM COMPLETE Result Date: 07/30/2023    ECHOCARDIOGRAM REPORT   Patient Name:   JAMIEL GONCALVES Date of Exam: 07/30/2023 Medical Rec #:  [id number]         Height:       73.0 in Accession #:    [id number]        Weight:       186.4 lb Date of Birth:  Feb 22, 1981         BSA:          2.088 m Patient Age:    42 years          BP:           123/94 mmHg Patient Gender: M                 HR:           103 bpm. Exam Location:  Inpatient Procedure: 2D Echo, Cardiac Doppler, Color Doppler and Intracardiac            Opacification Agent Indications:    Ascites  History:        Patient has no prior history of Echocardiogram examinations.                 Arrythmias:Tachycardia; Risk Factors:Hypertension.  Sonographer:  Lanell Maduro Referring Phys: Manuelito Poage IMPRESSIONS  1. Left ventricular ejection fraction, by estimation, is 60 to 65%. The left ventricle has normal function. The left ventricle has no regional wall motion abnormalities. Left ventricular diastolic parameters were normal.  2. Right ventricular systolic function is normal. The right ventricular size is normal.  3. The mitral valve is normal in structure. No evidence of mitral valve regurgitation. No evidence of mitral stenosis.  4. The aortic valve is normal in structure. Aortic valve regurgitation is not visualized. No aortic stenosis is present.  5. The inferior vena cava is normal in size with greater than 50%  respiratory variability, suggesting right atrial pressure of 3 mmHg. FINDINGS  Left Ventricle: Left ventricular ejection fraction, by estimation, is 60 to 65%. The left ventricle has normal function. The left ventricle has no regional wall motion abnormalities. Definity  contrast agent was given IV to delineate the left ventricular  endocardial borders. The left ventricular internal cavity size was normal in size. There is no left ventricular hypertrophy. Left ventricular diastolic parameters were normal. Right Ventricle: The right ventricular size is normal. No increase in right ventricular wall thickness. Right ventricular systolic function is normal. Left Atrium: Left atrial size was normal in size. Right Atrium: Right atrial size was normal in size. Pericardium: There is no evidence of pericardial effusion. Mitral Valve: The mitral valve is normal in structure. No evidence of mitral valve regurgitation. No evidence of mitral valve stenosis. Tricuspid Valve: The tricuspid valve is normal in structure. Tricuspid valve regurgitation is not demonstrated. No evidence of tricuspid stenosis. Aortic Valve: The aortic valve is normal in structure. Aortic valve regurgitation is not visualized. No aortic stenosis is present. Pulmonic Valve: The pulmonic valve was normal in structure. Pulmonic valve regurgitation is not visualized. No evidence of pulmonic stenosis. Aorta: The aortic root is normal in size and structure. Venous: The inferior vena cava is normal in size with greater than 50% respiratory variability, suggesting right atrial pressure of 3 mmHg. IAS/Shunts: No atrial level shunt detected by color flow Doppler.  LEFT VENTRICLE PLAX 2D LVIDd:         4.60 cm     Diastology LVIDs:         2.50 cm     LV e' medial:    9.36 cm/s LV PW:         1.00 cm     LV E/e' medial:  7.3 LV IVS:        1.00 cm     LV e' lateral:   11.20 cm/s LVOT diam:     2.40 cm     LV E/e' lateral: 6.1 LV SV:         59 LV SV Index:   28 LVOT  Area:     4.52 cm  LV Volumes (MOD) LV vol d, MOD A2C: 65.3 ml LV vol d, MOD A4C: 78.4 ml LV vol s, MOD A2C: 15.4 ml LV vol s, MOD A4C: 30.9 ml LV SV MOD A2C:     49.9 ml LV SV MOD A4C:     78.4 ml LV SV MOD BP:      49.6 ml RIGHT VENTRICLE RV Basal diam:  2.80 cm RV S prime:     11.70 cm/s TAPSE (M-mode): 2.4 cm LEFT ATRIUM             Index        RIGHT ATRIUM  Index LA diam:        3.40 cm 1.63 cm/m   RA Area:     10.20 cm LA Vol (A2C):   20.1 ml 9.63 ml/m   RA Volume:   19.00 ml  9.10 ml/m LA Vol (A4C):   33.6 ml 16.09 ml/m LA Biplane Vol: 28.1 ml 13.46 ml/m  AORTIC VALVE LVOT Vmax:   88.60 cm/s LVOT Vmean:  59.000 cm/s LVOT VTI:    0.130 m  AORTA Ao Root diam: 2.70 cm Ao Asc diam:  3.30 cm MITRAL VALVE MV Area (PHT): 4.60 cm    SHUNTS MV Decel Time: 165 msec    Systemic VTI:  0.13 m MV E velocity: 68.70 cm/s  Systemic Diam: 2.40 cm MV A velocity: 99.00 cm/s MV E/A ratio:  0.69 Aditya Sabharwal Electronically signed by Ria Commander Signature Date/Time: 07/30/2023/12:30:56 PM    Final    IR Paracentesis Result Date: 07/29/2023 INDICATION: Abdominal distention. New onset ascites. Request for diagnostic and therapeutic paracentesis up to 4 L max. EXAM: ULTRASOUND GUIDED RIGHT LOWER QUADRANT PARACENTESIS MEDICATIONS: 1% plain lidocaine , 5 mL COMPLICATIONS: None immediate. PROCEDURE: Informed written consent was obtained from the patient after a discussion of the risks, benefits and alternatives to treatment. A timeout was performed prior to the initiation of the procedure. Initial ultrasound scanning demonstrates a large amount of ascites within the right lower abdominal quadrant. The right lower abdomen was prepped and draped in the usual sterile fashion. 1% lidocaine  was used for local anesthesia. Following this, a 19 gauge, 7-cm, Yueh catheter was introduced. An ultrasound image was saved for documentation purposes. The paracentesis was performed. The catheter was removed and a dressing  was applied. The patient tolerated the procedure well without immediate post procedural complication. FINDINGS: A total of approximately 4 L of hazy, rust-colored fluid was removed. Samples were sent to the laboratory as requested by the clinical team. IMPRESSION: Successful ultrasound-guided paracentesis yielding 4 liters of peritoneal fluid. Procedure performed by Franky Rusk PA-C supervised by Dr. Marcey Moan Electronically Signed   By: Marcey Moan M.D.   On: 07/29/2023 09:20   US  Abdomen Limited RUQ (LIVER/GB) Result Date: 07/28/2023 CLINICAL DATA:  Elevated lipase EXAM: ULTRASOUND ABDOMEN LIMITED RIGHT UPPER QUADRANT COMPARISON:  None Available. FINDINGS: Gallbladder: No gallstones or wall thickening visualized. No sonographic Murphy sign noted by sonographer. Common bile duct: Diameter: Not well visualized Liver: Increase in echogenicity likely related to fatty infiltration. Portal vein is patent on color Doppler imaging with normal direction of blood flow towards the liver. Other: Moderate ascites is identified. IMPRESSION: Fatty liver. Moderate ascites. Electronically Signed   By: Oneil Devonshire M.D.   On: 07/28/2023 19:07   CT ABDOMEN PELVIS W CONTRAST Result Date: 07/28/2023 CLINICAL DATA:  Acute abdominal pain with abdominal distension, initial encounter EXAM: CT ABDOMEN AND PELVIS WITH CONTRAST TECHNIQUE: Multidetector CT imaging of the abdomen and pelvis was performed using the standard protocol following bolus administration of intravenous contrast. RADIATION DOSE REDUCTION: This exam was performed according to the departmental dose-optimization program which includes automated exposure control, adjustment of the mA and/or kV according to patient size and/or use of iterative reconstruction technique. CONTRAST:  75mL OMNIPAQUE  IOHEXOL  350 MG/ML SOLN COMPARISON:  None Available. FINDINGS: Lower chest: Lung bases are free of acute infiltrate or sizable effusion. Hepatobiliary: Fatty  infiltration of the liver is noted. Mild hepatomegaly is seen. Gallbladder is within normal limits. Pancreas: Pancreas is well visualized. Very minimal peripancreatic inflammatory changes are noted likely  representing some mild pancreatitis. Spleen: Normal in size without focal abnormality. Adrenals/Urinary Tract: Adrenal glands are within normal limits. Kidneys demonstrate a normal enhancement pattern bilaterally. No renal calculi or obstructive changes are seen. The bladder is partially distended. Stomach/Bowel: No obstructive or inflammatory changes of the colon are seen. The appendix is within normal limits. Small bowel and stomach are unremarkable. Vascular/Lymphatic: Aortic atherosclerosis. No enlarged abdominal or pelvic lymph nodes. Reproductive: Prostate is unremarkable. Other: Significant ascites is noted with abdominal distension consistent with the given clinical history. Musculoskeletal: No acute bony abnormality is noted. Rounded fluid attenuation density is noted within the right buttock inferiorly measuring up to 4.6 cm. This likely represents a sebaceous cyst. IMPRESSION: Changes of mild pancreatitis. Significant ascites is identified. Fatty infiltration with hepatomegaly. Electronically Signed   By: Oneil Devonshire M.D.   On: 07/28/2023 17:30    Time coordinating discharge: 50 mins  SIGNED:  Camellia Door, DO [facility] 08/05/23, 1:09 PM

## 2023-08-05 NOTE — TOC CM/SW Note (Signed)
 Received secure chat from MD , patient needs transportation home. Spoke with patient at bedside. Patient states discharge is not until tomorrow. Secure chatted MD. GI has cleared him and he can be discharged today. Patient aware . Patient states he can get a ride home now. His mom gets off work at [employer] and will pick him up

## 2023-08-05 NOTE — Progress Notes (Signed)
   08/05/23 1119  Mobility  Activity Ambulated independently in hallway  Level of Assistance Independent  Assistive Device None  Distance Ambulated (ft) 1100 ft  Activity Response Tolerated fair  Mobility Referral Yes  Mobility visit 1 Mobility  Mobility Specialist Start Time (ACUTE ONLY) 1109  Mobility Specialist Stop Time (ACUTE ONLY) 1119  Mobility Specialist Time Calculation (min) (ACUTE ONLY) 10 min   Mobility Specialist: Progress Note  Pt agreeable to mobility session - received in bed. Required Ind with no AD. Pt c/o abd pain 5/10 and feet numbness. Returned to standing in room with all needs met - call bell within reach.   Virgle Boards, BS Mobility Specialist Please contact via SecureChat or Rehab office at [phone].

## 2023-08-05 NOTE — Telephone Encounter (Signed)
-----   Message from Unk Lightning sent at 08/05/2023 12:32 PM EST ----- Regarding: follow up Needs follow up with colleen in next 3-4 weeks for alcoholic pancreatitis/iDA and Cirrhosis- thanks-JLL

## 2023-08-05 NOTE — Progress Notes (Addendum)
 Progress Note   Subjective  Chief Complaint: Alcoholic pancreatitis with pseudocyst and ascites  This morning patient tells me that he is feeling fairly well, he had quite a large breakfast which did not increase his abdominal pain or cause him any nausea or vomiting.  He has not had IV pain meds in quite a few days.  Tells me he is pretty much ready to go home but he does not have a ride today.   Objective   Vital signs in last 24 hours: Temp:  [98.2 F (36.8 C)-99.6 F (37.6 C)] 98.2 F (36.8 C) (01/06 0745) Pulse Rate:  [84-102] 91 (01/06 0745) Resp:  [16-18] 17 (01/06 0745) BP: (107-114)/(71-82) 111/79 (01/06 0745) SpO2:  [97 %-98 %] 98 % (01/06 0745) Weight:  [79.7 kg-80 kg] 79.7 kg (01/06 0518) Last BM Date : 08/04/23 (per pt report) General:    white male in NAD Heart:  Regular rate and rhythm; no murmurs Lungs: Respirations even and unlabored, lungs CTA bilaterally Abdomen:  Soft, mild epigastric TTP and nondistended. Normal bowel sounds. Psych:  Cooperative. Normal mood and affect.  Intake/Output from previous day: 01/05 0701 - 01/06 0700 In: 120 [P.O.:120] Out: -  Intake/Output this shift: Total I/O In: 540 [P.O.:540] Out: -   Lab Results:  BMET Recent Labs    08/03/23 0801 08/04/23 0636 08/05/23 0538  NA 129* 131* 131*  K 4.1 3.6 3.6  CL 93* 93* 91*  CO2 25 27 30   GLUCOSE 154* 156* 194*  BUN 12 10 9   CREATININE 0.85 0.77 0.85  CALCIUM 7.8* 7.8* 8.0*      Latest Ref Rng & Units 08/05/2023    5:38 AM 08/04/2023    6:36 AM 08/03/2023    8:01 AM  Hepatic Function  Total Protein 6.5 - 8.1 g/dL 5.7  5.4  5.6   Albumin  3.5 - 5.0 g/dL 1.7  1.7  1.9   AST 15 - 41 U/L 28  21  26    ALT 0 - 44 U/L 15  14  17    Alk Phosphatase 38 - 126 U/L 128  110  131   Total Bilirubin 0.0 - 1.2 mg/dL 0.6  0.7  0.8       Assessment / Plan:   Assessment: 1.  Alcoholic pancreatitis with pseudocyst and ascites: Hyponatremia and severe hypoalbuminemia with a lipase level  of 41,643, pain and nausea/vomiting improved-tolerating regular diet 2.  Acute alcoholic hepatitis: Improved 3.  IDA with no evidence of GI bleed 4.  Hypertension 5.  Malnutrition 6.  Question of cirrhosis: Workup unrevealing, likely alcohol related  Plan: 1.  Patient is much improved since admission.  He is actually ready to go home.  He does not have a ride today though. 2.  Can continue current diet 3.  Patient will need an EGD and further workup outpatient given his iron deficiency anemia and possibly further workup to see if he has cirrhosis underlying 4.  Okay with patient discharge home in the next 48 hours, will arrange for him to have follow-up. 5. Will allow Dr. Shila to weigh in on continuing SQ Octreotide  6.  Continue diuretics outpatient including Lasix  60 mg twice daily and Aldactone  100 mg daily 7.  Recommend alcohol abstinence  Thank you for your kind consultation.    LOS: 7 days   Delon Hendricks Failing  08/05/2023, 12:27 PM   Attending physician's note   I have taken a history, reviewed the chart and examined the  patient. I performed a substantive portion of this encounter, including complete performance of at least one of the key components, in conjunction with the APP. I agree with the APP's note, impression and recommendations.    43 year old male with acute alcoholic hepatitis and alcoholic pancreatitis with pseudocyst and ascites Ascites is improving on diuretics and subcutaneous octreotide  Plan to continue subcutaneous octreotide  for additional 10 days to complete 2-week course Continue current diuretic regimen, recheck CMP in 5 to 7 days, may need adjustment of diuretic dose as ascites fluid continues to improve Will need outpatient GI follow-up within 2 to 3 weeks on discharge, to be scheduled with Dr. Leigh or APP, we will arrange for it Increase protein calorie intake with high-protein diet Discussed alcohol cessation  Okay to discharge patient home  from GI standpoint, please call GI with any questions.  The patient was provided an opportunity to ask questions and all were answered. The patient agreed with the plan and demonstrated an understanding of the instructions.   LOIS Wilkie Mcgee , MD [phone]

## 2023-08-05 NOTE — Plan of Care (Signed)
  Problem: Education: Goal: Knowledge of General Education information will improve Description: Including pain rating scale, medication(s)/side effects and non-pharmacologic comfort measures Outcome: Progressing   Problem: Clinical Measurements: Goal: Ability to maintain clinical measurements within normal limits will improve Outcome: Progressing Goal: Diagnostic test results will improve Outcome: Progressing   Problem: Activity: Goal: Risk for activity intolerance will decrease Outcome: Progressing   Problem: Nutrition: Goal: Adequate nutrition will be maintained Outcome: Progressing   Problem: Coping: Goal: Level of anxiety will decrease Outcome: Progressing

## 2023-08-05 NOTE — Progress Notes (Signed)
 PROGRESS NOTE    Juan Crawford  FMW:[id number] DOB: [birth year] DOA: 07/28/2023 PCP: Valma Lannie LABOR, PA-C  Subjective: Pt seen and examined this AM.  Pt remains on SQ octreotide . Awaiting GI to address whether he needs to continue with medication or not. Increased dose of lasix  and frequency has helped with increased urine output.  Pt states his job is secure and his boss wants him to go to inpatient rehab.   Hospital Course: HPI: Juan Crawford is a 43 y.o. male with medical history significant of alcohol use disorder and hypertension presenting with abdominal and leg swelling for the past 3 weeks.   He reports that he first noticed abdominal swelling about 3 weeks ago.  His abdominal swelling progressively became worse over the past couple of weeks and he began developing bilateral leg swelling as well.  He came to the emergency department as his abdominal and leg swelling was not going away on its own.  He does report history of alcohol use, drinks about 1/5 of liquor daily.  His last drink was around 4 AM this morning.  He denies any nausea, vomiting, yellowing of his skin, chest pain, palpitations, shortness of breath, abdominal pain, diarrhea, constipation, urinary changes.  He denies any history of delirium tremens.   ED course: Initial vital signs with tachycardia and elevated BP, but he is afebrile and saturating well on room air.  CBC showing leukocytosis but otherwise with stable hemoglobin and platelet count.  CMP showing hyponatremia, hypokalemia, elevated AST and alk phos, hypoalbuminemia, normal kidney function.  PT mildly elevated with normal INR.  Alcohol level elevated.  Lipase elevated at 182.  Lactate 2.1.  Ammonia 41.  BNP normal.  CT abdomen pelvis showing mild pancreatitis, fatty infiltration with hepatomegaly, and significant ascites.  Right upper quadrant ultrasound showing fatty liver and moderate ascites.  ED provider has reached out to IR who will perform  paracentesis tomorrow.  ED provider also consulted GI, Dr. Wilhelmenia, who will evaluate patient tomorrow.  [facility] hospitalist asked to evaluate patient for admission.    Significant Events: Admitted 07/28/2023 for new onset ascites   Significant Labs: Lipase elevated at 182. Lactate 2.1. Ammonia 41, ETOH 180  Significant Imaging Studies: CT abdomen pelvis showing mild pancreatitis, fatty infiltration with hepatomegaly, and significant ascites.  Right upper quadrant ultrasound showing fatty liver and moderate ascites.   Antibiotic Therapy: Anti-infectives (From admission, onward)    None       Procedures: 07-29-2023 Paracentesis for 4 liters 08-01-2023 paracentesis for 7 liters  Consultants: GI    Assessment and Plan: * Ascites 07-29-2023 s/p 4 liter paracentesis today. Ascites fluid does not meet criteria for SBP.  No abx are indicated at this time. 07-30-2023 GI continues workup for his ascites. May need to re-image in a few days to see if he needs another paracentesis. Discussed with pt that paracentesis would be repeated only if he develops difficulty with breathing or early satiety. 07-31-2023 pt with moderate amount of ascites again. Pt states abd distension is getting more uncomfortable again. Will ask IR for another large volume paracentesis tomorrow. 08-01-2023 for repeat paracentesis today. Large volume. Remove as much fluid as possible. RD consult to increase protein intake in his diet. Prn IV albumin  ordered after paracentesis. Defer to GI service to start aldactone  as they see fit. 08-02-2023 s/p 7 liter paracentesis yesterday. Received 50 g IV albumin  afterwards. GI concerned about ascites coming from pancreas. On SQ octreotide  q8h. Ascitic fluid yesterday NOT  consistent with SBP. Started on po lasix  and po aldcatone per GI 08-03-2023 Advised he needs to consume 1-1.5 g/kg of protein each day. Bedside abd U/S shows minimal ascites in RLQ and LLQ. No ascites in  RUQ/LUQ. Definitely not enough ascites to safely perform paracentesis. Ascitic fluid lipase was 25,473 and ascitic fluid amylase of 4478. Pancreas is likely the source of his ascites. Awaiting further f/u from GI service.Pt remains on SQ octreotide  TID per GI service 08-04-2023 still awaiting for GI service to address pt's continued need for SQ octreotide . Pt with slowly developing more ascites. Last paracentesis was on 08-01-2023. Will increase lasix  60 mg bid and keep aldactone  at 100 mg daily.  08-05-2023 remains on SQ TID octreotide . Awaiting GI to decide on what to do about this medication. Increased dose/frequency of lasix  (60 mg bid) along with aldactone  100 mg qday has increased his urine output. He states his abd is not any more distended than yesterday.  Alcoholic cirrhosis of liver with ascites (HCC) 07-31-2023 his cirrhosis workup thus far has been unrevealing. 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  Defer to GI if he needs EGD to evaluate for portal gastropathy. Defer to GI if pt needs SBP prophylaxis, aldactone . Pt already on atenolol  for his HTN. Can change over to coreg/nadolol/lopressor /propranolol if desired by GI. 08-01-2023 still somewhat tachy. BP has some room to increase atenolol . Will increase to 37.5 mg bid. 08-02-2023 HR right at 100. On atenolol  37.5 mg bid. No further room in BP to increase. Now on lasix  and aldcatone as well. Monitor BP.  08-05-2023 stable on atenolol  37.5 mg bid, lasix  60 mg bid, aldactone  100 mg qday. Could change atenolol  to Toprol -XL 75 mg daily at discharge to ease pill burden.    Alcohol-induced acute pancreatitis without infection or necrosis 07-29-2023 only mild elevation of lipase. Stable. 07-30-2023 stable lipase. 07-31-2023 MRCP shows some cyst in his pancreas. 08-01-2023 pt to remain abstinent from all etoh use. 08-03-2023 appears his ascites is related to his pancreas. Awaiting for GI  service to address his SQ octreotide . 08-04-2023 awaiting GI decision on SQ octreotide  to continue at discharge.  08-05-2023 awaiting GI decision on SQ octreotide  to continue at discharge.  Alcohol use disorder 07-29-2023 continue with CIWA protocol. Pt admits to drinking 1/5 liquor per day. 07-30-2023 2 prn ativans given in the last 24 hours. 07-31-2023 2 prn ativans given since I last saw patient yesterday. Can stop CIWA soon. 08-01-2023 off CIWA protocol. Outside window now for Dts. 08-02-2023 has some anxiety/withdrawal yesterday. Started on prn librium . 08-03-2023 continue prn librium  08-05-2023 has not used prn librium  in several days. Pt interested in po naltrexone  at discharge. Discussed that he could not start it for about 5-7 days since he has been getting prn oxycodone . He will not be going home with oxycodone .  Protein-calorie malnutrition, severe 08/01/2023 per RD note from today. Severe Malnutrition related to chronic illness as evidenced by severe muscle depletion, energy intake < or equal to 75% for > or equal to 1 month, mild fat depletion.   Essential hypertension 07-29-2023 start atenolol  12.5 mg bid. 07-30-2023 BP still elevated. Will increase atenolol  to 25 mg bid. 07-31-2023 continue with current dose of atenolol  25 mg bid. 08-01-2023 increase atenolol  to 37.5 mg bid to help with HR 100s. 08-02-2023 HR right at 100. On atenolol  37.5 mg bid. No further room in BP to increase. Now on lasix  and aldcatone as  well. Monitor BP.  08-03-2023 stable on atenolol  37.5 bid.  08-04-2023 stable on atenolol  37.5 mg bid. Increasing lasix  to 60 mg bid due to re-accumulating ascites. On aldactone  100 mg daily.  08-05-2023 stable on atenolol  37.5 mg bid, lasix  60 mg bid, aldactone  100 mg qday. Could change atenolol  to Toprol -XL 75 mg daily at discharge to ease pill burden.   Hypokalemia 07-29-2023 replete with po kcl 07-30-2023 resolved. K of 3.6 07-31-2023 give more po  kcl. 08-01-2023 resolved with replacement.  Hyponatremia 07-29-2023 likely due to alcohol intake and very little solute intake. Asymptomatic at this point. Follow with daily BMP. 07-30-2023 Na down to 127. Will check serum osm, urine osm, urine electrolytes. My intuition says that his hyponatremia is from low solute intake rather than SIADH. TSH is normal at 1.68. repeat BMP in AM. 07-31-2023 unfortunately, his urine electrolytes not colleted when his sodium was low. Now his Na is up to 133. He is eating better last 24 hours. All this points to low solute intake as cause of his hyponatremia and not volume overload or SIADH 08-01-2023 down today due to NPO for paracentesis. Due to low solute intake. 08-02-2023 stable. Monitor now that he is on lasix  and aldactone . 08-03-2023 stable. Na of 129. On po lasix  and aldactone .  08-04-2023 Na 131. Continue to monitor as lasix  will be increased to 60 mg bid. Continue aldactone  100 mg qday. 08-05-2023 Na stable at 131.  Tobacco abuse 07-31-2023 continue with nicotine  patch.       DVT prophylaxis: Place and maintain sequential compression device Start: 07/28/23 2017    Code Status: Full Code Family Communication: no family at bedside. Pt is decisional Disposition Plan: pt states he is going to live with his mother for a few days after DC. Then get himself admitted into etoh rehab center. Reason for continuing need for hospitalization: medically stable for DC. Awaiting GI clearance for DC.  Objective: Vitals:   08/05/23 0431 08/05/23 0500 08/05/23 0518 08/05/23 0745  BP: 107/82   111/79  Pulse: 84   91  Resp: 16   17  Temp: 98.5 F (36.9 C)   98.2 F (36.8 C)  TempSrc: Oral   Oral  SpO2: 98%   98%  Weight:  80 kg 79.7 kg   Height:        Intake/Output Summary (Last 24 hours) at 08/05/2023 1125 Last data filed at 08/05/2023 0745 Gross per 24 hour  Intake 320 ml  Output --  Net 320 ml   Filed Weights   08/04/23 0317 08/05/23 0500  08/05/23 0518  Weight: 78.6 kg 80 kg 79.7 kg    Examination:  Physical Exam Vitals and nursing note reviewed.  Constitutional:      General: He is not in acute distress.    Appearance: Normal appearance. He is not toxic-appearing or diaphoretic.  HENT:     Head: Normocephalic and atraumatic.     Nose: Nose normal.  Cardiovascular:     Rate and Rhythm: Normal rate and regular rhythm.  Pulmonary:     Effort: Pulmonary effort is normal.     Breath sounds: Normal breath sounds.  Abdominal:     General: Bowel sounds are normal.     Palpations: Abdomen is soft.     Tenderness: There is no abdominal tenderness. There is no guarding or rebound.  Musculoskeletal:     Right lower leg: No edema.     Left lower leg: No edema.  Skin:  General: Skin is warm and dry.     Capillary Refill: Capillary refill takes less than 2 seconds.  Neurological:     General: No focal deficit present.     Mental Status: He is alert and oriented to person, place, and time.     Data Reviewed: I have personally reviewed following labs and imaging studies  CBC: Recent Labs  Lab 07/30/23 0344 07/31/23 0810  WBC 11.6* 9.9  HGB 10.5* 11.2*  HCT 31.7* 33.5*  MCV 92.7 92.8  PLT 296 361   Basic Metabolic Panel: Recent Labs  Lab 08/01/23 0248 08/02/23 0611 08/03/23 0801 08/04/23 0636 08/05/23 0538  NA 129* 129* 129* 131* 131*  K 4.0 4.4 4.1 3.6 3.6  CL 95* 93* 93* 93* 91*  CO2 25 24 25 27 30   GLUCOSE 107* 144* 154* 156* 194*  BUN 8 9 12 10 9   CREATININE 0.78 0.72 0.85 0.77 0.85  CALCIUM 7.3* 7.8* 7.8* 7.8* 8.0*  MG  --   --  1.7 1.8 1.7   GFR: Estimated Creatinine Clearance: 128.9 mL/min (by C-G formula based on SCr of 0.85 mg/dL). Liver Function Tests: Recent Labs  Lab 08/01/23 0248 08/02/23 0611 08/03/23 0801 08/04/23 0636 08/05/23 0538  AST 52* 29 26 21 28   ALT 23 17 17 14 15   ALKPHOS 205* 153* 131* 110 128*  BILITOT 0.9 0.9 0.8 0.7 0.6  PROT 5.2* 5.1* 5.6* 5.4* 5.7*   ALBUMIN  1.5* 1.9* 1.9* 1.7* 1.7*   Recent Labs  Lab 07/30/23 0344  LIPASE 164*    Coagulation Profile: Recent Labs  Lab 07/31/23 0810  INR 1.4*   BNP (last 3 results) Recent Labs    07/28/23 1629  BNP 23.4   CBG: Recent Labs  Lab 08/01/23 0827 08/02/23 0810 08/03/23 0754 08/04/23 0728 08/05/23 0743  GLUCAP 90 137* 149* 168* 180*    Recent Results (from the past 240 hours)  Blood culture (routine x 2)     Status: None   Collection Time: 07/28/23  3:25 PM   Specimen: BLOOD  Result Value Ref Range Status   Specimen Description BLOOD SITE NOT SPECIFIED  Final   Special Requests   Final    BOTTLES DRAWN AEROBIC AND ANAEROBIC Blood Culture results may not be optimal due to an inadequate volume of blood received in culture bottles   Culture   Final    NO GROWTH 5 DAYS Performed at [facility], [address], [place], [place] [address]    Report Status 08/02/2023 FINAL  Final  Blood culture (routine x 2)     Status: None   Collection Time: 07/28/23  8:58 PM   Specimen: BLOOD RIGHT HAND  Result Value Ref Range Status   Specimen Description BLOOD RIGHT HAND  Final   Special Requests   Final    BOTTLES DRAWN AEROBIC AND ANAEROBIC Blood Culture results may not be optimal due to an excessive volume of blood received in culture bottles   Culture   Final    NO GROWTH 5 DAYS Performed at [facility], [address] [place], [place] [address]    Report Status 08/02/2023 FINAL  Final  Culture, body fluid w Gram Stain-bottle     Status: None   Collection Time: 07/29/23  8:15 AM   Specimen: Peritoneal Washings  Result Value Ref Range Status   Specimen Description PERITONEAL  Final   Special Requests NONE  Final   Culture   Final  NO GROWTH 5 DAYS Performed at [facility], [address], [place], [place] [address]    Report Status 08/03/2023 FINAL  Final  Gram stain     Status: None   Collection Time: 07/29/23  8:15 AM   Specimen: Peritoneal  Washings  Result Value Ref Range Status   Specimen Description PERITONEAL  Final   Special Requests NONE  Final   Gram Stain   Final    WBC SEEN NO ORGANISMS SEEN CYTOSPIN SMEAR Performed at [facility], [address] [place], [place] [address]    Report Status 07/29/2023 FINAL  Final  Culture, body fluid w Gram Stain-bottle     Status: None (Preliminary result)   Collection Time: 08/01/23 12:49 PM   Specimen: Peritoneal Washings  Result Value Ref Range Status   Specimen Description PERITONEAL  Final   Special Requests NONE  Final   Culture   Final    NO GROWTH 4 DAYS Performed at [facility], [address] [place], [place] [address]    Report Status PENDING  Incomplete  Gram stain     Status: None   Collection Time: 08/01/23 12:49 PM   Specimen: Peritoneal Washings  Result Value Ref Range Status   Specimen Description PERITONEAL  Final   Special Requests NONE  Final   Gram Stain   Final    NO WBC SEEN NO ORGANISMS SEEN Performed at [facility], [address] [place], [place] [address]    Report Status 08/01/2023 FINAL  Final     Radiology Studies: No results found.  Scheduled Meds:  atenolol   37.5 mg Oral BID   feeding supplement  237 mL Oral TID BM   folic acid   1 mg Oral Daily   furosemide   60 mg Oral BID   lidocaine   1 patch Transdermal Q24H   lipase/protease/amylase  36,000 Units Oral TID WC   lipase/protease/amylase  36,000 Units Oral BID BM   multivitamin with minerals  1 tablet Oral Daily   nicotine   21 mg Transdermal Daily   octreotide   100 mcg Subcutaneous Q8H   spironolactone   100 mg Oral Daily   thiamine   100 mg Oral Daily   Or   thiamine   100 mg Intravenous Daily   Continuous Infusions:   LOS: 7 days   Time spent: 35 minutes  Camellia Door, DO  [facility]  08/05/2023, 11:25 AM

## 2023-08-05 NOTE — Progress Notes (Signed)
 Escorted to discharge lounge to await ride.  TOC medications in hand.

## 2023-08-06 ENCOUNTER — Other Ambulatory Visit (HOSPITAL_COMMUNITY): Payer: Self-pay

## 2023-08-06 LAB — CULTURE, BODY FLUID W GRAM STAIN -BOTTLE: Culture: NO GROWTH

## 2023-08-06 NOTE — Telephone Encounter (Signed)
 Pharmacy Patient Advocate Encounter  Received notification from [facility] that Prior Authorization for Octreotide  Acetate 100MCG/ML syringes  has been DENIED.  Full denial letter will be uploaded to the media tab. See denial reason below. The request for coverage for OCTREOTIDE  INJ , use as directed (42 per 14 days), is denied. This decision is based on health plan criteria for OCTREOTIDE  INJ . This medicine is covered only if:  Diagnosis of bleeding gastroesophageal varices associated with liver disease. The information provided does not show that you meet the criteria listed above.  PA #/Case ID/Reference #: [id number]

## 2023-08-07 ENCOUNTER — Other Ambulatory Visit (HOSPITAL_COMMUNITY): Payer: Self-pay

## 2023-08-07 ENCOUNTER — Telehealth: Payer: Self-pay

## 2023-08-07 NOTE — Telephone Encounter (Signed)
-----   Message from Beasley sent at 08/07/2023  1:52 PM EST ----- Juan Crawford, patient was suppose to be scheduled for a GI follow up in our office 2 to 3 weeks after he was discharged home from the hospital. Pls schedule him for a follow up appointment with Dr. Leigh, me or other APP who saw him in the hospital. THX.

## 2023-08-07 NOTE — Telephone Encounter (Signed)
 Left message for pt to call back

## 2023-08-08 NOTE — Telephone Encounter (Signed)
 Refer back to phone note 08/05/23, [place], RN will be contacting patient for a 7 day hold slot.

## 2023-08-15 ENCOUNTER — Encounter: Payer: Self-pay | Admitting: Nurse Practitioner

## 2023-08-23 ENCOUNTER — Telehealth: Payer: Self-pay

## 2023-08-23 NOTE — Telephone Encounter (Signed)
-----   Message from Nurse [place] B sent at 08/05/2023 12:52 PM EST ----- Regarding: scheudle appt Schedule follow up with Colleen on 08/30/23 at 11 am. alcoholic pancreatitis/iDA and Cirrhosis

## 2023-08-23 NOTE — Telephone Encounter (Signed)
Called and spoke with patient. He is aware of new appt information.

## 2023-08-30 ENCOUNTER — Other Ambulatory Visit (INDEPENDENT_AMBULATORY_CARE_PROVIDER_SITE_OTHER): Payer: 59

## 2023-08-30 ENCOUNTER — Ambulatory Visit (INDEPENDENT_AMBULATORY_CARE_PROVIDER_SITE_OTHER): Payer: 59 | Admitting: Nurse Practitioner

## 2023-08-30 ENCOUNTER — Encounter: Payer: Self-pay | Admitting: Nurse Practitioner

## 2023-08-30 VITALS — BP 110/70 | HR 98 | Ht 73.0 in | Wt 150.0 lb

## 2023-08-30 DIAGNOSIS — K5900 Constipation, unspecified: Secondary | ICD-10-CM | POA: Diagnosis not present

## 2023-08-30 DIAGNOSIS — K760 Fatty (change of) liver, not elsewhere classified: Secondary | ICD-10-CM | POA: Diagnosis not present

## 2023-08-30 DIAGNOSIS — K852 Alcohol induced acute pancreatitis without necrosis or infection: Secondary | ICD-10-CM

## 2023-08-30 DIAGNOSIS — D509 Iron deficiency anemia, unspecified: Secondary | ICD-10-CM

## 2023-08-30 DIAGNOSIS — R188 Other ascites: Secondary | ICD-10-CM

## 2023-08-30 DIAGNOSIS — F1090 Alcohol use, unspecified, uncomplicated: Secondary | ICD-10-CM

## 2023-08-30 LAB — CBC WITH DIFFERENTIAL/PLATELET
Basophils Absolute: 0.1 10*3/uL (ref 0.0–0.1)
Basophils Relative: 0.5 % (ref 0.0–3.0)
Eosinophils Absolute: 0 10*3/uL (ref 0.0–0.7)
Eosinophils Relative: 0.2 % (ref 0.0–5.0)
HCT: 32.2 % — ABNORMAL LOW (ref 39.0–52.0)
Hemoglobin: 10.9 g/dL — ABNORMAL LOW (ref 13.0–17.0)
Lymphocytes Relative: 12 % (ref 12.0–46.0)
Lymphs Abs: 1.4 10*3/uL (ref 0.7–4.0)
MCHC: 33.7 g/dL (ref 30.0–36.0)
MCV: 83.8 fL (ref 78.0–100.0)
Monocytes Absolute: 0.9 10*3/uL (ref 0.1–1.0)
Monocytes Relative: 7.9 % (ref 3.0–12.0)
Neutro Abs: 9.5 10*3/uL — ABNORMAL HIGH (ref 1.4–7.7)
Neutrophils Relative %: 79.4 % — ABNORMAL HIGH (ref 43.0–77.0)
Platelets: 767 10*3/uL — ABNORMAL HIGH (ref 150.0–400.0)
RBC: 3.85 Mil/uL — ABNORMAL LOW (ref 4.22–5.81)
RDW: 17.2 % — ABNORMAL HIGH (ref 11.5–15.5)
WBC: 12 10*3/uL — ABNORMAL HIGH (ref 4.0–10.5)

## 2023-08-30 LAB — COMPREHENSIVE METABOLIC PANEL
ALT: 14 U/L (ref 0–53)
AST: 19 U/L (ref 0–37)
Albumin: 3.7 g/dL (ref 3.5–5.2)
Alkaline Phosphatase: 196 U/L — ABNORMAL HIGH (ref 39–117)
BUN: 12 mg/dL (ref 6–23)
CO2: 36 meq/L — ABNORMAL HIGH (ref 19–32)
Calcium: 9.3 mg/dL (ref 8.4–10.5)
Chloride: 82 meq/L — ABNORMAL LOW (ref 96–112)
Creatinine, Ser: 0.9 mg/dL (ref 0.40–1.50)
GFR: 106.36 mL/min (ref >60.00)
Glucose, Bld: 174 mg/dL — ABNORMAL HIGH (ref 70–99)
Potassium: 3.4 meq/L — ABNORMAL LOW (ref 3.5–5.1)
Sodium: 128 meq/L — ABNORMAL LOW (ref 135–145)
Total Bilirubin: 0.6 mg/dL (ref 0.2–1.2)
Total Protein: 8.1 g/dL (ref 6.0–8.3)

## 2023-08-30 LAB — LIPASE: Lipase: 759 U/L — ABNORMAL HIGH (ref 11.0–59.0)

## 2023-08-30 MED ORDER — SUTAB 1479-225-188 MG PO TABS: ORAL_TABLET | ORAL | 0 refills | Status: DC

## 2023-08-30 NOTE — Progress Notes (Unsigned)
08/30/2023 Juan Crawford [id number] [birth year]   Chief Complaint:  History of Present Illness: Juan Crawford is a 43 y.o. male with a past medical history of hypertension, elevated LFTs since 2020 and alcohol use disorder.  He developed abdominal bloat and leg swelling which progressively worsened over the past 2 weeks therefore he presented to the ED 07/28/2023 for further evaluation.  History of alcohol use disorder.  He drinks a fifth of liquor daily.  Labs in the ED showed a WBC count of 12.7.  Hemoglobin 13.3.  Hematocrit 38.5.  Platelet 326.  Sodium 130.  Potassium 2.8.  Glucose 169.  BUN < 5.  Creatinine 0.69.  Calcium 7.7.  Total bili 1.0.  Alk phos 324.  AST 74.  ALT 28.  Ethyl alcohol 180.  Blood cultures in process.  Urinalysis ordered, specimen not yet collected.  INR 1.2.  Ammonia 41.  BNP 23.4.  Lipase 176. MDF 10.7.  Lactic acid 2.3.  HIV in process.  CTAP showed mild hepatomegaly with hepatic steatosis, mild pancreatitis and significant ascites.  RUQ sonogram showed hepatic steatosis and moderate ascites with a patent portal vein.   Labs today: WBC 12.8.  Hemoglobin 11.7.  Platelet 273.  Sodium 129.  Potassium 3.3.  BUN 6.  Creatinine 0.70.  Total bili 1.0.  Alk phos 326.  AST 54.  ALT 24.  INR 1.3.   MELD 3.0: 18 at 07/29/2023  6:06 AM MELD-Na: 9 at 07/29/2023  6:06 AM Calculated from: Serum Creatinine: 0.7 mg/dL (Using min of 1 mg/dL) at 21/30/8657  8:46 AM Serum Sodium: 129 mmol/L at 07/29/2023  6:06 AM Total Bilirubin: 1 mg/dL at 96/29/5284  1:32 AM Serum Albumin: 1.9 g/dL at 44/07/270  5:36 AM INR(ratio): 1.3 at 07/29/2023  6:06 AM Age at listing (hypothetical): 42 years Sex: Male at 07/29/2023  6:06 AM   He underwent a paracentesis earlier this morning, 4L of peritoneal fluid removed. No evidence of SBP.  Peritoneal fluid Gram stain with WBCs seen without other organisms.  Cannot calculate SAAG with peritoneal albumin level < than 1.5. Peritoneal protein  less than 3.0.  Peritoneal amylase level significantly elevated at 3,367. Peritoneal fluid culture and cytology pending.   He endorses drinking heavily since his mid 30s with increased alcohol consumption following a fall which resulted in a fractured jaw and he subsequently underwent 2 surgeries in 2020.  He drinks up to one fifth of whiskey daily.  Last alcohol intake was on Sunday 07/28/2023 at 4 AM.  He developed swelling to his abdomen with epigastric discomfort and associated leg swelling which started 3 weeks ago.  He has intermittent nausea and vomits up partially digested food or nonbloody bilious emesis once weekly for at least the past year.  No heartburn or dysphagia.  No lower abdominal pain.  He passes 2-6 nonbloody loose stools daily, sometimes he skips a day without a bowel movement.  No black or bloody stools.  He denies having any history of liver disease but was previously informed his liver enzymes were elevated likely due to alcohol use.  No prior history of pancreatitis.  No known family history of liver disease.  Father with history of esophageal cancer.  He has lost 20 pounds over the past 2 months then noted weight gain over the past 3 weeks associated with his abdominal and leg swelling.  No fevers or night sweats.  No NSAID use.  No recent antibiotics.  He is on Amlodipine for hypertension since  2020 but intermittently goes on and off this medication.  Tired and weak. Went back home one week later. Started a few hours at a time.  Thurs and Friday felt well. Then down hill since then.   Last BM Monday, Sunday. Stool brown soft to loose. No bloody or black.  He had some solid stools in the hospital.  Prior to hospitalization he had chronic diarrhea, can't remember when he had a solid stools.  Saw PCP, prescribed laxative last Thursday.   No N/V. Last alcohol intake 12/28 am he went to hospital.  Staying at [facility] house. Has a lot of support.   Abdomen maybe a little tight    Yougurt 15 gm, bid. Protein 30 gm protein drinks  Chicken       Latest Ref Rng & Units 07/31/2023    8:10 AM 07/30/2023    3:44 AM 07/29/2023    6:06 AM  CBC  WBC 4.0 - 10.5 K/uL 9.9  11.6  12.8   Hemoglobin 13.0 - 17.0 g/dL 16.1  09.6  04.5   Hematocrit 39.0 - 52.0 % 33.5  31.7  34.1   Platelets 150 - 400 K/uL 361  296  273          Latest Ref Rng & Units 08/05/2023    5:38 AM 08/04/2023    6:36 AM 08/03/2023    8:01 AM  CMP  Glucose 70 - 99 mg/dL 409  811  914   BUN 6 - 20 mg/dL 9  10  12    Creatinine 0.61 - 1.24 mg/dL 7.82  9.56  2.13   Sodium 135 - 145 mmol/L 131  131  129   Potassium 3.5 - 5.1 mmol/L 3.6  3.6  4.1   Chloride 98 - 111 mmol/L 91  93  93   CO2 22 - 32 mmol/L 30  27  25    Calcium 8.9 - 10.3 mg/dL 8.0  7.8  7.8   Total Protein 6.5 - 8.1 g/dL 5.7  5.4  5.6   Total Bilirubin 0.0 - 1.2 mg/dL 0.6  0.7  0.8   Alkaline Phos 38 - 126 U/L 128  110  131   AST 15 - 41 U/L 28  21  26    ALT 0 - 44 U/L 15  14  17     Iron 26    Current Medications, Allergies, Past Medical History, Past Surgical History, Family History and Social History were reviewed in [employer] record.   Review of Systems:   Constitutional: Negative for fever, sweats, chills or weight loss.  Respiratory: Negative for shortness of breath.   Cardiovascular: Negative for chest pain, palpitations and leg swelling.  Gastrointestinal: See HPI.  Musculoskeletal: Negative for back pain or muscle aches.  Neurological: Negative for dizziness, headaches or paresthesias.    Physical Exam: BP 110/70   Pulse 98   Ht 6\' 1"  (1.854 m)   Wt 150 lb (68 kg)   BMI 19.79 kg/m  General: in no acute distress. Head: Normocephalic and atraumatic. Eyes: No scleral icterus. Conjunctiva pink . Ears: Normal auditory acuity. Mouth: Dentition intact. No ulcers or lesions.  Lungs: Clear throughout to auscultation. Heart: Regular rate and rhythm, no murmur. Abdomen: Soft, nontender and  nondistended. No masses or hepatomegaly. Normal bowel sounds x 4 quadrants.  Rectal: Deferred.  Musculoskeletal: Symmetrical with no gross deformities. Extremities: No edema. Neurological: Alert oriented x 4. No focal deficits.  Psychological: Alert and cooperative. 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   IDA  Questionable cirrhosis      43 year old male with year-old male with a history of alcohol use disorder presents with epigastric pain, abdominal and lower extremity swelling x 3 weeks.  Elevated alk phos, AST and ALT levels with normal total bili.  AST to ALT ratio consistent with chronic alcohol use. Normal PT/INR. CTAP showed mild hepatomegaly with hepatic steatosis without cirrhosis, mild pancreatitis and significant ascites. 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 Admission MDF 10.7, does not fit criteria for Prednisolone. Normal renal function.  No overt signs of hepatic encephalopathy.

## 2023-08-30 NOTE — Patient Instructions (Addendum)
You have been scheduled for an endoscopy and colonoscopy. Please follow the written instructions given to you at your visit today.  Please pick up your prep supplies at the pharmacy within the next 1-3 days.  If you use inhalers (even only as needed), please bring them with you on the day of your procedure. ____________________________________________  May increase Lactulose to 10 gram/15 mL to twice daily, decrease if passing more than 4 loose bowel movements.  No alcohol ever.  Your provider has requested that you go to the basement level for lab work before leaving today. Press "B" on the elevator. The lab is located at the first door on the left as you exit the elevator.  Due to recent changes in healthcare laws, you may see the results of your imaging and laboratory studies on MyChart before your provider has had a chance to review them.  We understand that in some cases there may be results that are confusing or concerning to you. Not all laboratory results come back in the same time frame and the provider may be waiting for multiple results in order to interpret others.  Please give [place] 48 hours in order for your provider to thoroughly review all the results before contacting the office for clarification of your results.   Thank you for trusting me with your gastrointestinal care!   Alcide Evener, CRNP

## 2023-09-01 ENCOUNTER — Encounter: Payer: Self-pay | Admitting: Nurse Practitioner

## 2023-09-02 NOTE — Progress Notes (Signed)
Agree with assessment plan as outlined.  This is a very complicated case.  Agree with labs at this time to monitor response to diuretics and titrate as appropriate.  Hopefully with alcohol abstinence and diuretics his ascites can get under better control.  Need to discuss with his insurance what form of pancreatic enzyme supplementation that they cover.  He needs to contact his insurance and let us know what his best.  Based on that result we can assess for assistance program.  If the patient continues improved and feels up for procedures on 3/14 we can proceed with that for IDA evaluation.  He will need monitoring of his labs up until that time.  I do recommend another MRI perhaps in 4 weeks or so for reassessment, hopefully with more time away from alcohol his condition will continue to improve.

## 2023-09-02 NOTE — Progress Notes (Signed)
Juan Crawford, I spoke to [place] earlier this morning to inquire about Creon assistance program. Verlon Au, let us know what you are able to find out when you can.   Juan Crawford, Pls contact patient and have him call his insurance carrier to verify what forms of pancreatic enzyme they will cover.   Please schedule him for a pancreatic protocol MRI with and without contrast in 4 weeks.  Send him to our lab in 2 weeks for a CBC, CMP and lipase level  Thank you

## 2023-09-03 ENCOUNTER — Other Ambulatory Visit: Payer: Self-pay

## 2023-09-03 NOTE — Progress Notes (Signed)
DD, thank you for assisting with Creon auth. Keep me updated. THX.

## 2023-09-04 ENCOUNTER — Other Ambulatory Visit: Payer: Self-pay

## 2023-09-04 ENCOUNTER — Telehealth: Payer: Self-pay

## 2023-09-04 DIAGNOSIS — K5900 Constipation, unspecified: Secondary | ICD-10-CM

## 2023-09-04 DIAGNOSIS — D509 Iron deficiency anemia, unspecified: Secondary | ICD-10-CM

## 2023-09-04 DIAGNOSIS — K852 Alcohol induced acute pancreatitis without necrosis or infection: Secondary | ICD-10-CM

## 2023-09-04 NOTE — Telephone Encounter (Signed)
 Spoke to pt.  Documented in result notes in labs.  Pt verbalized understanding with all questions answered.

## 2023-09-04 NOTE — Telephone Encounter (Signed)
 Message Received: 2 days ago Cara Elida HERO, NP  Wonda Standing, RN; Watt Sonny POUR, CMA      Previous Messages  Routed Note  Author: Cara Elida HERO, NP Service: Gastroenterology Author Type: Nurse Practitioner  Filed: 09/02/2023  1:36 PM Encounter Date: 08/30/2023 Status: Signed  Editor: Cara Elida HERO, NP (Nurse Practitioner)   Standing, I spoke to Sonny earlier this morning to inquire about Creon  assistance program. Sonny, let us  know what you are able to find out when you can.    Standing, Pls contact patient and have him call his insurance carrier to verify what forms of pancreatic enzyme they will cover.    Please schedule him for a pancreatic protocol MRI with and without contrast in 4 weeks.   Send him to our lab in 2 weeks for a CBC, CMP and lipase level   Thank you

## 2023-09-06 ENCOUNTER — Other Ambulatory Visit: Payer: Self-pay

## 2023-09-09 ENCOUNTER — Other Ambulatory Visit (HOSPITAL_COMMUNITY): Payer: Self-pay

## 2023-09-12 ENCOUNTER — Ambulatory Visit: Payer: 59 | Admitting: Nurse Practitioner

## 2023-09-13 ENCOUNTER — Other Ambulatory Visit: Payer: 59

## 2023-09-13 DIAGNOSIS — K5900 Constipation, unspecified: Secondary | ICD-10-CM

## 2023-09-13 DIAGNOSIS — K852 Alcohol induced acute pancreatitis without necrosis or infection: Secondary | ICD-10-CM | POA: Diagnosis not present

## 2023-09-13 DIAGNOSIS — D509 Iron deficiency anemia, unspecified: Secondary | ICD-10-CM

## 2023-09-13 LAB — PROTIME-INR
INR: 1.5 {ratio} — ABNORMAL HIGH (ref 0.8–1.0)
Prothrombin Time: 15.2 s — ABNORMAL HIGH (ref 9.6–13.1)

## 2023-09-13 LAB — CBC WITH DIFFERENTIAL/PLATELET
Basophils Absolute: 0.1 10*3/uL (ref 0.0–0.1)
Basophils Relative: 1 % (ref 0.0–3.0)
Eosinophils Absolute: 0 10*3/uL (ref 0.0–0.7)
Eosinophils Relative: 0.3 % (ref 0.0–5.0)
HCT: 32.5 % — ABNORMAL LOW (ref 39.0–52.0)
Hemoglobin: 10.5 g/dL — ABNORMAL LOW (ref 13.0–17.0)
Lymphocytes Relative: 13.2 % (ref 12.0–46.0)
Lymphs Abs: 1.4 10*3/uL (ref 0.7–4.0)
MCHC: 32.4 g/dL (ref 30.0–36.0)
MCV: 82 fL (ref 78.0–100.0)
Monocytes Absolute: 0.8 10*3/uL (ref 0.1–1.0)
Monocytes Relative: 7.6 % (ref 3.0–12.0)
Neutro Abs: 8.1 10*3/uL — ABNORMAL HIGH (ref 1.4–7.7)
Neutrophils Relative %: 77.9 % — ABNORMAL HIGH (ref 43.0–77.0)
Platelets: 628 10*3/uL — ABNORMAL HIGH (ref 150.0–400.0)
RBC: 3.96 Mil/uL — ABNORMAL LOW (ref 4.22–5.81)
RDW: 18.1 % — ABNORMAL HIGH (ref 11.5–15.5)
WBC: 10.4 10*3/uL (ref 4.0–10.5)

## 2023-09-13 LAB — COMPREHENSIVE METABOLIC PANEL
ALT: 9 U/L (ref 0–53)
AST: 12 U/L (ref 0–37)
Albumin: 3.7 g/dL (ref 3.5–5.2)
Alkaline Phosphatase: 122 U/L — ABNORMAL HIGH (ref 39–117)
BUN: 13 mg/dL (ref 6–23)
CO2: 30 meq/L (ref 19–32)
Calcium: 9.3 mg/dL (ref 8.4–10.5)
Chloride: 89 meq/L — ABNORMAL LOW (ref 96–112)
Creatinine, Ser: 0.78 mg/dL (ref 0.40–1.50)
GFR: 111.02 mL/min (ref >60.00)
Glucose, Bld: 97 mg/dL (ref 70–99)
Potassium: 3.7 meq/L (ref 3.5–5.1)
Sodium: 130 meq/L — ABNORMAL LOW (ref 135–145)
Total Bilirubin: 0.6 mg/dL (ref 0.2–1.2)
Total Protein: 7.7 g/dL (ref 6.0–8.3)

## 2023-09-13 LAB — LIPASE: Lipase: 410 U/L — ABNORMAL HIGH (ref 11.0–59.0)

## 2023-10-01 ENCOUNTER — Other Ambulatory Visit: Payer: Self-pay

## 2023-10-02 ENCOUNTER — Other Ambulatory Visit: Payer: Self-pay | Admitting: Nurse Practitioner

## 2023-10-02 ENCOUNTER — Other Ambulatory Visit: Payer: Self-pay

## 2023-10-02 ENCOUNTER — Ambulatory Visit (HOSPITAL_COMMUNITY)
Admission: RE | Admit: 2023-10-02 | Discharge: 2023-10-02 | Disposition: A | Discharge status: Home / Self Care | Payer: 59 | Source: Ambulatory Visit | Attending: Nurse Practitioner | Admitting: Nurse Practitioner

## 2023-10-02 DIAGNOSIS — K5900 Constipation, unspecified: Secondary | ICD-10-CM | POA: Insufficient documentation

## 2023-10-02 DIAGNOSIS — K852 Alcohol induced acute pancreatitis without necrosis or infection: Secondary | ICD-10-CM

## 2023-10-02 DIAGNOSIS — D509 Iron deficiency anemia, unspecified: Secondary | ICD-10-CM | POA: Insufficient documentation

## 2023-10-02 MED ORDER — GADOBUTROL 1 MMOL/ML IV SOLN
6.5000 mL | Freq: Once | INTRAVENOUS | Status: AC | PRN
  Administered 2023-10-02: 6.5 mL via INTRAVENOUS

## 2023-10-02 MED ORDER — DIPHENHYDRAMINE HCL 50 MG/ML IJ SOLN
INTRAMUSCULAR | Status: AC
  Filled 2023-10-02: qty 1

## 2023-10-02 MED ORDER — DIPHENHYDRAMINE HCL 50 MG/ML IJ SOLN
25.0000 mg | Freq: Once | INTRAMUSCULAR | Status: AC
  Administered 2023-10-02: 25 mg via INTRAVENOUS
  Filled 2023-10-02: qty 0.5

## 2023-10-02 NOTE — Progress Notes (Signed)
 Patient presented to [facility] MRI department today for an MR Abdomen MRCP. He experienced a contrast reaction from the gadavist and developed erythema and hives in the left neck/shoulder area. He did not have any respiratory or circulatory symptoms. VSS.  25 mg IV benadryl administered by RN. Patient reassessed after 30 minutes and the area of erythema/hives was noticeably reduced. Patient still breathing well and without additional complaints.   Patient ok for discharge. Return precautions discussed. MRI team has documented the reaction and added Gadavist to his list of allergies.   Alwyn Ren, AGACNP-BC 10/02/2023, 2:02 PM

## 2023-10-03 ENCOUNTER — Encounter: Payer: Self-pay | Admitting: Gastroenterology

## 2023-10-09 ENCOUNTER — Other Ambulatory Visit: Payer: Self-pay

## 2023-10-09 ENCOUNTER — Other Ambulatory Visit: Payer: Self-pay | Admitting: Nurse Practitioner

## 2023-10-10 ENCOUNTER — Other Ambulatory Visit: Payer: Self-pay

## 2023-10-10 ENCOUNTER — Telehealth: Payer: Self-pay | Admitting: Nurse Practitioner

## 2023-10-10 ENCOUNTER — Other Ambulatory Visit (INDEPENDENT_AMBULATORY_CARE_PROVIDER_SITE_OTHER)

## 2023-10-10 DIAGNOSIS — K852 Alcohol induced acute pancreatitis without necrosis or infection: Secondary | ICD-10-CM

## 2023-10-10 DIAGNOSIS — D509 Iron deficiency anemia, unspecified: Secondary | ICD-10-CM

## 2023-10-10 LAB — COMPREHENSIVE METABOLIC PANEL
ALT: 10 U/L (ref 0–53)
AST: 12 U/L (ref 0–37)
Albumin: 4 g/dL (ref 3.5–5.2)
Alkaline Phosphatase: 90 U/L (ref 39–117)
BUN: 16 mg/dL (ref 6–23)
CO2: 30 meq/L (ref 19–32)
Calcium: 10.1 mg/dL (ref 8.4–10.5)
Chloride: 87 meq/L — ABNORMAL LOW (ref 96–112)
Creatinine, Ser: 0.97 mg/dL (ref 0.40–1.50)
GFR: 97.14 mL/min (ref >60.00)
Glucose, Bld: 137 mg/dL — ABNORMAL HIGH (ref 70–99)
Potassium: 4.3 meq/L (ref 3.5–5.1)
Sodium: 129 meq/L — ABNORMAL LOW (ref 135–145)
Total Bilirubin: 0.7 mg/dL (ref 0.2–1.2)
Total Protein: 7.9 g/dL (ref 6.0–8.3)

## 2023-10-10 LAB — CBC WITH DIFFERENTIAL/PLATELET
Basophils Absolute: 0.1 10*3/uL (ref 0.0–0.1)
Basophils Relative: 1 % (ref 0.0–3.0)
Eosinophils Absolute: 0 10*3/uL (ref 0.0–0.7)
Eosinophils Relative: 0.1 % (ref 0.0–5.0)
HCT: 40.1 % (ref 39.0–52.0)
Hemoglobin: 12.9 g/dL — ABNORMAL LOW (ref 13.0–17.0)
Lymphocytes Relative: 10 % — ABNORMAL LOW (ref 12.0–46.0)
Lymphs Abs: 1.4 10*3/uL (ref 0.7–4.0)
MCHC: 32.1 g/dL (ref 30.0–36.0)
MCV: 77.9 fl — ABNORMAL LOW (ref 78.0–100.0)
Monocytes Absolute: 0.9 10*3/uL (ref 0.1–1.0)
Monocytes Relative: 6.2 % (ref 3.0–12.0)
Neutro Abs: 11.8 10*3/uL — ABNORMAL HIGH (ref 1.4–7.7)
Neutrophils Relative %: 82.7 % — ABNORMAL HIGH (ref 43.0–77.0)
Platelets: 635 10*3/uL — ABNORMAL HIGH (ref 150.0–400.0)
RBC: 5.14 Mil/uL (ref 4.22–5.81)
RDW: 17.6 % — ABNORMAL HIGH (ref 11.5–15.5)
WBC: 14.3 10*3/uL — ABNORMAL HIGH (ref 4.0–10.5)

## 2023-10-10 LAB — LIPASE: Lipase: 216 U/L — ABNORMAL HIGH (ref 11.0–59.0)

## 2023-10-10 MED ORDER — PANCRELIPASE (LIP-PROT-AMYL) 36000-114000 UNITS PO CPEP
36000.0000 [IU] | ORAL_CAPSULE | Freq: Three times a day (TID) | ORAL | 0 refills | Status: DC
  Filled 2023-10-10: qty 100, 31d supply, fill #0
  Filled 2023-10-27 – 2023-11-04 (×4): qty 100, 31d supply, fill #1
  Filled 2023-12-05 – 2023-12-09 (×2): qty 70, 24d supply, fill #2

## 2023-10-10 NOTE — Telephone Encounter (Signed)
Colleen, please advise. 

## 2023-10-10 NOTE — Telephone Encounter (Signed)
 DD, I refilled his RX. However, he was to enroll in Creon assistance program. Patient to let me know if Creon assistance program not yet implemented. THX.

## 2023-10-11 ENCOUNTER — Encounter: Payer: 59 | Admitting: Gastroenterology

## 2023-10-11 ENCOUNTER — Other Ambulatory Visit: Payer: Self-pay

## 2023-10-14 ENCOUNTER — Other Ambulatory Visit: Payer: Self-pay | Admitting: *Deleted

## 2023-10-14 DIAGNOSIS — K852 Alcohol induced acute pancreatitis without necrosis or infection: Secondary | ICD-10-CM

## 2023-10-16 ENCOUNTER — Other Ambulatory Visit: Payer: Self-pay

## 2023-10-16 DIAGNOSIS — R948 Abnormal results of function studies of other organs and systems: Secondary | ICD-10-CM | POA: Insufficient documentation

## 2023-10-18 ENCOUNTER — Other Ambulatory Visit (INDEPENDENT_AMBULATORY_CARE_PROVIDER_SITE_OTHER)

## 2023-10-18 DIAGNOSIS — K852 Alcohol induced acute pancreatitis without necrosis or infection: Secondary | ICD-10-CM | POA: Diagnosis not present

## 2023-10-18 LAB — COMPREHENSIVE METABOLIC PANEL
ALT: 9 U/L (ref 0–53)
AST: 14 U/L (ref 0–37)
Albumin: 4.1 g/dL (ref 3.5–5.2)
Alkaline Phosphatase: 100 U/L (ref 39–117)
BUN: 12 mg/dL (ref 6–23)
CO2: 33 meq/L — ABNORMAL HIGH (ref 19–32)
Calcium: 10.1 mg/dL (ref 8.4–10.5)
Chloride: 91 meq/L — ABNORMAL LOW (ref 96–112)
Creatinine, Ser: 0.73 mg/dL (ref 0.40–1.50)
GFR: 113.19 mL/min (ref >60.00)
Glucose, Bld: 102 mg/dL — ABNORMAL HIGH (ref 70–99)
Potassium: 3.8 meq/L (ref 3.5–5.1)
Sodium: 135 meq/L (ref 135–145)
Total Bilirubin: 0.5 mg/dL (ref 0.2–1.2)
Total Protein: 8.1 g/dL (ref 6.0–8.3)

## 2023-10-18 LAB — CBC WITH DIFFERENTIAL/PLATELET
Basophils Absolute: 0.1 10*3/uL (ref 0.0–0.1)
Basophils Relative: 1.4 % (ref 0.0–3.0)
Eosinophils Absolute: 0 10*3/uL (ref 0.0–0.7)
Eosinophils Relative: 0.5 % (ref 0.0–5.0)
HCT: 32.7 % — ABNORMAL LOW (ref 39.0–52.0)
Hemoglobin: 10.7 g/dL — ABNORMAL LOW (ref 13.0–17.0)
Lymphocytes Relative: 18.5 % (ref 12.0–46.0)
Lymphs Abs: 1.3 10*3/uL (ref 0.7–4.0)
MCHC: 32.8 g/dL (ref 30.0–36.0)
MCV: 76.5 fl — ABNORMAL LOW (ref 78.0–100.0)
Monocytes Absolute: 0.7 10*3/uL (ref 0.1–1.0)
Monocytes Relative: 9.9 % (ref 3.0–12.0)
Neutro Abs: 5.1 10*3/uL (ref 1.4–7.7)
Neutrophils Relative %: 69.7 % (ref 43.0–77.0)
Platelets: 587 10*3/uL — ABNORMAL HIGH (ref 150.0–400.0)
RBC: 4.27 Mil/uL (ref 4.22–5.81)
RDW: 17.1 % — ABNORMAL HIGH (ref 11.5–15.5)
WBC: 7.3 10*3/uL (ref 4.0–10.5)

## 2023-10-22 ENCOUNTER — Other Ambulatory Visit: Payer: Self-pay

## 2023-10-22 DIAGNOSIS — D509 Iron deficiency anemia, unspecified: Secondary | ICD-10-CM

## 2023-10-27 ENCOUNTER — Other Ambulatory Visit: Payer: Self-pay

## 2023-10-28 ENCOUNTER — Other Ambulatory Visit: Payer: Self-pay

## 2023-10-30 ENCOUNTER — Other Ambulatory Visit: Payer: Self-pay

## 2023-10-30 ENCOUNTER — Encounter: Payer: Self-pay | Admitting: Gastroenterology

## 2023-11-01 ENCOUNTER — Other Ambulatory Visit: Payer: Self-pay

## 2023-11-04 ENCOUNTER — Other Ambulatory Visit (HOSPITAL_COMMUNITY): Payer: Self-pay

## 2023-11-04 ENCOUNTER — Ambulatory Visit (HOSPITAL_COMMUNITY)
Admission: RE | Admit: 2023-11-04 | Discharge: 2023-11-04 | Disposition: A | Discharge status: Home / Self Care | Source: Ambulatory Visit | Attending: Gastroenterology | Admitting: Gastroenterology

## 2023-11-04 DIAGNOSIS — K852 Alcohol induced acute pancreatitis without necrosis or infection: Secondary | ICD-10-CM | POA: Insufficient documentation

## 2023-11-04 MED ORDER — IOHEXOL 350 MG/ML SOLN
100.0000 mL | Freq: Once | INTRAVENOUS | Status: AC | PRN
  Administered 2023-11-04: 100 mL via INTRAVENOUS

## 2023-11-05 ENCOUNTER — Other Ambulatory Visit: Payer: Self-pay

## 2023-11-05 MED ORDER — METOPROLOL SUCCINATE ER 50 MG PO TB24
75.0000 mg | ORAL_TABLET | Freq: Every day | ORAL | 0 refills | Status: AC
  Filled 2023-11-05 (×2): qty 45, 30d supply, fill #0
  Filled 2024-01-07: qty 45, 30d supply, fill #1

## 2023-11-05 MED ORDER — FUROSEMIDE 40 MG PO TABS
60.0000 mg | ORAL_TABLET | Freq: Every day | ORAL | 0 refills | Status: AC
  Filled 2023-11-05: qty 45, 30d supply, fill #0
  Filled 2024-01-07: qty 45, 30d supply, fill #1

## 2023-11-05 MED ORDER — NALTREXONE HCL 50 MG PO TABS
50.0000 mg | ORAL_TABLET | Freq: Every day | ORAL | 2 refills | Status: DC
  Filled 2023-11-05: qty 30, 30d supply, fill #0
  Filled 2024-01-07: qty 30, 30d supply, fill #1

## 2023-11-05 MED ORDER — SPIRONOLACTONE 100 MG PO TABS
100.0000 mg | ORAL_TABLET | Freq: Every day | ORAL | 0 refills | Status: AC
  Filled 2023-11-05: qty 30, 30d supply, fill #0
  Filled 2024-01-07: qty 30, 30d supply, fill #1

## 2023-11-05 MED ORDER — FOLIC ACID 1 MG PO TABS
1.0000 mg | ORAL_TABLET | Freq: Every day | ORAL | 6 refills | Status: DC
  Filled 2023-11-05: qty 30, 30d supply, fill #0
  Filled 2024-01-07: qty 30, 30d supply, fill #1

## 2023-11-07 ENCOUNTER — Other Ambulatory Visit (HOSPITAL_COMMUNITY): Payer: Self-pay

## 2023-11-18 ENCOUNTER — Other Ambulatory Visit: Payer: Self-pay | Admitting: *Deleted

## 2023-11-18 ENCOUNTER — Other Ambulatory Visit (INDEPENDENT_AMBULATORY_CARE_PROVIDER_SITE_OTHER)

## 2023-11-18 DIAGNOSIS — K852 Alcohol induced acute pancreatitis without necrosis or infection: Secondary | ICD-10-CM

## 2023-11-18 DIAGNOSIS — D509 Iron deficiency anemia, unspecified: Secondary | ICD-10-CM | POA: Diagnosis not present

## 2023-11-18 DIAGNOSIS — R948 Abnormal results of function studies of other organs and systems: Secondary | ICD-10-CM | POA: Diagnosis not present

## 2023-11-18 LAB — CBC WITH DIFFERENTIAL/PLATELET
Basophils Absolute: 0.1 10*3/uL (ref 0.0–0.1)
Basophils Relative: 0.9 % (ref 0.0–3.0)
Eosinophils Absolute: 0.1 10*3/uL (ref 0.0–0.7)
Eosinophils Relative: 0.6 % (ref 0.0–5.0)
HCT: 37.5 % — ABNORMAL LOW (ref 39.0–52.0)
Hemoglobin: 12.2 g/dL — ABNORMAL LOW (ref 13.0–17.0)
Lymphocytes Relative: 17.4 % (ref 12.0–46.0)
Lymphs Abs: 1.7 10*3/uL (ref 0.7–4.0)
MCHC: 32.7 g/dL (ref 30.0–36.0)
MCV: 77.4 fl — ABNORMAL LOW (ref 78.0–100.0)
Monocytes Absolute: 0.8 10*3/uL (ref 0.1–1.0)
Monocytes Relative: 8.6 % (ref 3.0–12.0)
Neutro Abs: 7.1 10*3/uL (ref 1.4–7.7)
Neutrophils Relative %: 72.5 % (ref 43.0–77.0)
Platelets: 322 10*3/uL (ref 150.0–400.0)
RBC: 4.84 Mil/uL (ref 4.22–5.81)
RDW: 19.4 % — ABNORMAL HIGH (ref 11.5–15.5)
WBC: 9.8 10*3/uL (ref 4.0–10.5)

## 2023-11-18 LAB — COMPREHENSIVE METABOLIC PANEL WITH GFR
ALT: 13 U/L (ref 0–53)
AST: 14 U/L (ref 0–37)
Albumin: 4.4 g/dL (ref 3.5–5.2)
Alkaline Phosphatase: 94 U/L (ref 39–117)
BUN: 29 mg/dL — ABNORMAL HIGH (ref 6–23)
CO2: 29 meq/L (ref 19–32)
Calcium: 9.9 mg/dL (ref 8.4–10.5)
Chloride: 93 meq/L — ABNORMAL LOW (ref 96–112)
Creatinine, Ser: 1.32 mg/dL (ref 0.40–1.50)
GFR: 67.07 mL/min (ref >60.00)
Glucose, Bld: 213 mg/dL — ABNORMAL HIGH (ref 70–99)
Potassium: 4.3 meq/L (ref 3.5–5.1)
Sodium: 133 meq/L — ABNORMAL LOW (ref 135–145)
Total Bilirubin: 0.4 mg/dL (ref 0.2–1.2)
Total Protein: 8 g/dL (ref 6.0–8.3)

## 2023-11-19 ENCOUNTER — Other Ambulatory Visit: Payer: Self-pay | Admitting: *Deleted

## 2023-11-19 DIAGNOSIS — K852 Alcohol induced acute pancreatitis without necrosis or infection: Secondary | ICD-10-CM

## 2023-11-19 DIAGNOSIS — R948 Abnormal results of function studies of other organs and systems: Secondary | ICD-10-CM

## 2023-11-25 ENCOUNTER — Other Ambulatory Visit (INDEPENDENT_AMBULATORY_CARE_PROVIDER_SITE_OTHER)

## 2023-11-25 DIAGNOSIS — R948 Abnormal results of function studies of other organs and systems: Secondary | ICD-10-CM

## 2023-11-25 DIAGNOSIS — K852 Alcohol induced acute pancreatitis without necrosis or infection: Secondary | ICD-10-CM

## 2023-11-25 DIAGNOSIS — D509 Iron deficiency anemia, unspecified: Secondary | ICD-10-CM | POA: Diagnosis not present

## 2023-11-25 LAB — CBC WITH DIFFERENTIAL/PLATELET
Basophils Absolute: 0.1 10*3/uL (ref 0.0–0.1)
Basophils Relative: 1.4 % (ref 0.0–3.0)
Eosinophils Absolute: 0.1 10*3/uL (ref 0.0–0.7)
Eosinophils Relative: 0.9 % (ref 0.0–5.0)
HCT: 37.1 % — ABNORMAL LOW (ref 39.0–52.0)
Hemoglobin: 12.1 g/dL — ABNORMAL LOW (ref 13.0–17.0)
Lymphocytes Relative: 24.4 % (ref 12.0–46.0)
Lymphs Abs: 2 10*3/uL (ref 0.7–4.0)
MCHC: 32.5 g/dL (ref 30.0–36.0)
MCV: 78 fl (ref 78.0–100.0)
Monocytes Absolute: 0.7 10*3/uL (ref 0.1–1.0)
Monocytes Relative: 8.6 % (ref 3.0–12.0)
Neutro Abs: 5.4 10*3/uL (ref 1.4–7.7)
Neutrophils Relative %: 64.7 % (ref 43.0–77.0)
Platelets: 351 10*3/uL (ref 150.0–400.0)
RBC: 4.76 Mil/uL (ref 4.22–5.81)
RDW: 19.6 % — ABNORMAL HIGH (ref 11.5–15.5)
WBC: 8.4 10*3/uL (ref 4.0–10.5)

## 2023-11-25 LAB — BASIC METABOLIC PANEL WITH GFR
BUN: 18 mg/dL (ref 6–23)
CO2: 30 meq/L (ref 19–32)
Calcium: 9.6 mg/dL (ref 8.4–10.5)
Chloride: 97 meq/L (ref 96–112)
Creatinine, Ser: 0.79 mg/dL (ref 0.40–1.50)
GFR: 110.44 mL/min (ref >60.00)
Glucose, Bld: 165 mg/dL — ABNORMAL HIGH (ref 70–99)
Potassium: 3.5 meq/L (ref 3.5–5.1)
Sodium: 137 meq/L (ref 135–145)

## 2023-11-25 LAB — HEMOGLOBIN A1C: Hgb A1c MFr Bld: 7.8 % — ABNORMAL HIGH (ref 4.6–6.5)

## 2023-12-04 ENCOUNTER — Ambulatory Visit (INDEPENDENT_AMBULATORY_CARE_PROVIDER_SITE_OTHER): Admitting: Gastroenterology

## 2023-12-04 ENCOUNTER — Encounter: Payer: Self-pay | Admitting: Gastroenterology

## 2023-12-04 VITALS — BP 100/60 | HR 78 | Ht 72.0 in | Wt 147.0 lb

## 2023-12-04 DIAGNOSIS — R634 Abnormal weight loss: Secondary | ICD-10-CM | POA: Diagnosis not present

## 2023-12-04 DIAGNOSIS — I868 Varicose veins of other specified sites: Secondary | ICD-10-CM

## 2023-12-04 DIAGNOSIS — Z8719 Personal history of other diseases of the digestive system: Secondary | ICD-10-CM

## 2023-12-04 DIAGNOSIS — R933 Abnormal findings on diagnostic imaging of other parts of digestive tract: Secondary | ICD-10-CM

## 2023-12-04 DIAGNOSIS — K852 Alcohol induced acute pancreatitis without necrosis or infection: Secondary | ICD-10-CM | POA: Diagnosis not present

## 2023-12-04 DIAGNOSIS — R6881 Early satiety: Secondary | ICD-10-CM

## 2023-12-04 DIAGNOSIS — K863 Pseudocyst of pancreas: Secondary | ICD-10-CM

## 2023-12-04 NOTE — Patient Instructions (Signed)
 You have been scheduled for a CT scan of the abdomen and pelvis at [facility], 1st floor Radiology. You are scheduled on 12/06/23 at 10:30 am . You should arrive 2 hours prior to your appointment time for registration and to drink oral contrast at 8:30 am   Please follow the written instructions below on the day of your exam:   1) Do not eat anything after 6:30 am  (4 hours prior to your test)    You may take any medications as prescribed with a small amount of water, if necessary. If you take any of the following medications: METFORMIN, GLUCOPHAGE, GLUCOVANCE, AVANDAMET, RIOMET, FORTAMET, ACTOPLUS MET, JANUMET, GLUMETZA or METAGLIP, you MAY be asked to HOLD this medication 48 hours AFTER the exam.   The purpose of you drinking the oral contrast is to aid in the visualization of your intestinal tract. The contrast solution may cause some diarrhea. Depending on your individual set of symptoms, you may also receive an intravenous injection of x-ray contrast/dye. Plan on being at [facility] for 45 minutes or longer, depending on the type of exam you are having performed.   If you have any questions regarding your exam or if you need to reschedule, you may call [facility] at [phone] between the hours of 8:00 am and 5:00 pm, Monday-Friday.   _______________________________________________________  If your blood pressure at your visit was 140/90 or greater, please contact your primary care physician to follow up on this.  _______________________________________________________  If you are age 18 or older, your body mass index should be between 23-30. Your Body mass index is 19.94 kg/m. If this is out of the aforementioned range listed, please consider follow up with your Primary Care Provider.  If you are age 95 or younger, your body mass index should be between 19-25. Your Body mass index is 19.94 kg/m. If this is out of the aformentioned range listed, please consider  follow up with your Primary Care Provider.   ________________________________________________________  The [facility] providers would like to encourage you to use MYCHART to communicate with providers for non-urgent requests or questions.  Due to long hold times on the telephone, sending your provider a message by [facility] may be a faster and more efficient way to get a response.  Please allow 48 business hours for a response.  Please remember that this is for non-urgent requests.  _______________________________________________________   Thank you for choosing me and [facility].  Dr. Brice Campi

## 2023-12-05 ENCOUNTER — Other Ambulatory Visit: Payer: Self-pay

## 2023-12-06 ENCOUNTER — Ambulatory Visit (HOSPITAL_COMMUNITY)
Admission: RE | Admit: 2023-12-06 | Discharge: 2023-12-06 | Disposition: A | Discharge status: Home / Self Care | Source: Ambulatory Visit | Attending: Gastroenterology | Admitting: Gastroenterology

## 2023-12-06 DIAGNOSIS — K863 Pseudocyst of pancreas: Secondary | ICD-10-CM | POA: Diagnosis present

## 2023-12-06 DIAGNOSIS — R634 Abnormal weight loss: Secondary | ICD-10-CM | POA: Diagnosis present

## 2023-12-06 DIAGNOSIS — Z8719 Personal history of other diseases of the digestive system: Secondary | ICD-10-CM | POA: Diagnosis present

## 2023-12-06 MED ORDER — IOHEXOL 9 MG/ML PO SOLN
500.0000 mL | ORAL | Status: AC
  Administered 2023-12-06 (×2): 500 mL via ORAL

## 2023-12-06 MED ORDER — IOHEXOL 300 MG/ML  SOLN
100.0000 mL | Freq: Once | INTRAMUSCULAR | Status: AC | PRN
  Administered 2023-12-06: 100 mL via INTRAVENOUS

## 2023-12-07 ENCOUNTER — Encounter: Payer: Self-pay | Admitting: Gastroenterology

## 2023-12-07 DIAGNOSIS — R6881 Early satiety: Secondary | ICD-10-CM | POA: Insufficient documentation

## 2023-12-07 DIAGNOSIS — R933 Abnormal findings on diagnostic imaging of other parts of digestive tract: Secondary | ICD-10-CM | POA: Insufficient documentation

## 2023-12-07 DIAGNOSIS — R634 Abnormal weight loss: Secondary | ICD-10-CM | POA: Insufficient documentation

## 2023-12-07 DIAGNOSIS — I868 Varicose veins of other specified sites: Secondary | ICD-10-CM | POA: Insufficient documentation

## 2023-12-07 DIAGNOSIS — K863 Pseudocyst of pancreas: Secondary | ICD-10-CM | POA: Insufficient documentation

## 2023-12-07 NOTE — Progress Notes (Signed)
 GASTROENTEROLOGY OUTPATIENT CLINIC VISIT   Primary Care Provider Dawayne Estrin, PA-C [address] [place] [address] [phone]  Referring Provider Dr. General Kenner  Patient Profile: Juan Crawford is a 43 y.o. 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  The patient presents to the [facility] for an evaluation and management of problem(s) noted below:  Problem List 1. Alcohol-induced acute pancreatitis without infection or necrosis   2. Pseudocyst of pancreas   3. Splenic varices   4. Unintentional weight loss   5. Early satiety   6. Imaging of gastrointestinal tract abnormal    Discussed the use of AI scribe software for clinical note transcription with the patient, who gave verbal consent to proceed.  History of Present Illness I am asked to evaluate this patient for my partner Dr. General Kenner.  He has a history of alcoholic pancreatitis with significant pancreatic fluid collections/pseudocyst development.  Initially our plan was for an attempt at cystgastrostomy but his April 2025 CT (documented below) showed significant issues concerning as to whether he would best be served at a [facility] with multiple advanced endoscopists being available.  He presents today for follow-up and his EUS is still scheduled for later this month with me.  He has not heard from [facility] yet (though had a missed call just this afternoon).  Today, he is now 4 months sober.  He has regained some weight since his significant episode of pancreatitis.  Around Easter, he had about a 5-day period of not doing well eating and with pain but now is back to normal and had a normal diet earlier  today without issue.  His appetite has fluctuated; it was growing before he started feeling bad again a couple of weeks ago, but he reports having a normal appetite now, as evidenced by eating a regular lunch without issues.  He denies any abdominal pain today.  When he does have discomfort, it is mostly on his left flank.  He has no changes in his bowel habits at this time.  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   GI Review of Systems Positive as above Negative for dysphagia, melena, hematochezia  Review of Systems General: Denies fevers/chills Cardiovascular: Denies chest pain Pulmonary: Denies shortness of breath Gastroenterological: See HPI Genitourinary: Denies darkened urine Hematological: Denies easy bruising/bleeding Dermatological: Denies jaundice Psychological: Mood is stable  Medications Current Outpatient Medications  Medication Sig Dispense Refill   folic acid  (FOLVITE ) 1 MG tablet Take 1 tablet (1 mg total) by mouth daily. 30 tablet 6   furosemide  (LASIX ) 40 MG tablet Take 1.5 tablets (60 mg total) by mouth daily. (Patient taking differently: Take 40 mg by mouth daily.) 135 tablet 0   lactulose (CHRONULAC) 10 GM/15ML solution Take by mouth.     lipase/protease/amylase (CREON ) 36000 UNITS CPEP capsule Take 1 capsule (36,000 Units total) by mouth 3 (three) times daily with meals. 270 capsule 0   metoprolol  succinate (TOPROL -XL) 50 MG 24 hr tablet Take 1.5 tablets (75 mg total) by mouth daily. 135 tablet 0   naltrexone  (DEPADE) 50 MG tablet Take 1 tablet (50 mg total) by mouth daily. 30 tablet 2   nicotine  (NICODERM CQ  - DOSED IN MG/24 HOURS) 21 mg/24hr patch Place 1 patch (21 mg total) onto the  skin daily. 28 patch 0   sertraline (ZOLOFT) 25 MG tablet Take 50 mg by mouth daily.     Sodium Sulfate-Mag Sulfate-KCl (SUTAB ) 1479-225-188 MG TABS Use as directed for colonoscopy. MANUFACTURER CODES!! BIN: [id number] PCN: CN GROUP: [id number] MEMBER ID: [id number];XBM AS  SECONDARY INSURANCE ;NO PRIOR AUTHORIZATION 24 tablet 0   spironolactone  (ALDACTONE ) 100 MG tablet Take 1 tablet (100 mg total) by mouth daily. (Patient taking differently: Take 50 mg by mouth daily.) 90 tablet 0   No current facility-administered medications for this visit.    Allergies Allergies  Allergen Reactions   Gadolinium Derivatives Hives and Itching    Patient received Gadavist  contrast, a few minutes after receiving contrast patient got hives and itching on neck, chest and left shoulder. Pt treated with Benadryl .     Histories 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   Hypertension    Nicotine  dependence    Past Surgical History:  Procedure Laterality Date   CYST EXCISION Left 02/12/2019   Procedure: DEBRIDEMENT NECK WOUND AND PLACEMENT OF DRAIN;  Surgeon: Virgina Grills, MD;  Location: [facility];  Service: ENT;  Laterality: Left;   FACIAL LACERATION REPAIR N/A 12/21/2018   Procedure: Facial Laceration Repair;  Surgeon: Virgina Grills, MD;  Location: [facility] OR;  Service: ENT;  Laterality: N/A;  3cm wound on chin   HAND SURGERY  2001   IR PARACENTESIS  07/29/2023   IR PARACENTESIS  08/01/2023   MANDIBULAR HARDWARE REMOVAL Bilateral 02/12/2019   Procedure: MANDIBULAR HARDWARE REMOVAL;  Surgeon: Virgina Grills, MD;  Location: [facility];  Service: ENT;  Laterality: Bilateral;   ORIF MANDIBULAR FRACTURE N/A 12/21/2018   Procedure: MAXILLO-MANDIBULAR FIXATION AND ORIF OF MANDIBLE FRACTURE;  Surgeon: Virgina Grills, MD;  Location: [facility] OR;  Service: ENT;  Laterality: N/A;   Social History   Socioeconomic History   Marital status: Single    Spouse name: Not on file   Number of children: Not on file   Years of education: Not on file   Highest education level: Not on file  Occupational History   Not on file  Tobacco Use   Smoking status: Every Day    Current packs/day: 1.00    Average packs/day: 1 pack/day for 20.0  years (20.0 ttl pk-yrs)    Types: E-cigarettes, Cigarettes   Smokeless tobacco: Never  Vaping Use   Vaping status: Never Used  Substance and Sexual Activity   Alcohol use: Yes    Alcohol/week: 6.0 standard drinks of alcohol    Types: 6 Shots of liquor per week    Comment: heavily   Drug use: Yes    Frequency: 3.0 times per week    Types: Marijuana    Comment: last smoked 02/11/2019   Sexual activity: Not Currently  Other Topics Concern   Not on file  Social History Narrative   Not on file   Social Drivers of Health   Financial Resource Strain: Low Risk  (10/09/2023)   Received from [facility]   Overall Financial Resource Strain (CARDIA)    Difficulty of Paying Living Expenses: Not very hard  Food Insecurity: No Food Insecurity (10/09/2023)   [facility]   Hunger Vital Sign    Worried About Running Out of Food in the Last Year: Never true    Ran Out of Food in the Last Year: Never true  Transportation Needs: No Transportation Needs (10/09/2023)   Received from [facility]  PRAPARE - Administrator, Civil Service (Medical): No    Lack of Transportation (Non-Medical): No  Physical Activity: Unknown (10/09/2023)   Received from [facility]   Exercise Vital Sign    Days of Exercise per Week: 0 days    Minutes of Exercise per Session: Not on file  Stress: Stress Concern Present (10/09/2023)   Received from [facility] - Occupational Stress Questionnaire    Feeling of Stress : To some extent  Social Connections: Moderately Integrated (10/09/2023)   Received from [facility]   Social Network    How would you rate your social network (family, work, friends)?: Adequate participation with social networks  Intimate Partner Violence: Not At Risk (10/09/2023)   Received from [facility]   HITS    Over the last 12 months how often did your partner physically hurt you?: Never    Over the last 12 months  how often did your partner insult you or talk down to you?: Never    Over the last 12 months how often did your partner threaten you with physical harm?: Never    Over the last 12 months how often did your partner scream or curse at you?: Never   Family History  Problem Relation Age of Onset   Diabetes Neg Hx    Colon cancer Neg Hx    Esophageal cancer Neg Hx    Inflammatory bowel disease Neg Hx    Liver disease Neg Hx    Pancreatic cancer Neg Hx    Rectal cancer Neg Hx    Stomach cancer Neg Hx    I have reviewed his medical, social, and family history in detail and updated the electronic medical record as necessary.    PHYSICAL EXAMINATION  BP 100/60   Pulse 78   Ht 6' (1.829 m)   Wt 147 lb (66.7 kg)   BMI 19.94 kg/m  Wt Readings from Last 3 Encounters:  12/04/23 147 lb (66.7 kg)  08/30/23 150 lb (68 kg)  08/05/23 175 lb 11.3 oz (79.7 kg)  GEN: NAD, appears older than stated age, appears chronically ill but is nontoxic PSYCH: Cooperative, without pressured speech EYE: Conjunctivae pink, sclerae anicteric ENT: MMM CV: Nontachycardic RESP: No audible wheezing GI: NABS, soft, NT/ND, without rebound or guarding, no HSM appreciated MSK/EXT: No significant lower extremity edema SKIN: No jaundice NEURO:  Alert & Oriented x 3, no focal deficits   REVIEW OF DATA  I reviewed the following data at the time of this encounter:  GI Procedures and Studies  No relevant studies to review  Laboratory Studies  Reviewed those in epic  Imaging Studies  11/04/2023 CTAP IMPRESSION: *Interval worsening in the size of the pseudocyst within the body of the pancreas along the lesser sac measuring today 8.2 x 7.5 cm in AP diameter compared to prior where it measured 5.8 x 7.3 cm. *Worsening developing fluid in the left paracolic gutter with the largest collection measuring at least 10 by 5.2 cm in the transverse and AP diameter extending all the way down into the pelvis. *Multiple small  loculated fluid collections are noted along the superior margin of the left paracolic gutter lateral to the spleen the most posterior 1 measures 4.9 by 1.6 cm, the next 1 measures 3.2 x 1.7 cm the most anterior 1 measures 5 x 2.3 cm. This loculated fluid collections containing anteriorly along the gastrohepatic margin along the anterior aspect of the left  lobe of the liver, with the largest of the collection measuring 13 by 3.7 by 2 cm in the transverse, AP and longitudinal diameter. *Minimal residual inflammatory changes surrounding the head of the pancreas and the adjacent peripancreatic soft tissues. Correlate clinically for infection to exclude infected pseudocyst. *Well-defined cystic fluid collection in the right posteromedial gluteal soft tissues posterior to the right ischial rectal fossa measures 5.1 x 3.3 by 4 cm in the AP, transverse and longitudinal diameter. This correlates with the fluid density mass perhaps an abscess or other loculated fluid collections. Correlate clinically, this area is easily accessible for percutaneous aspiration.  10/02/2023 MRCP IMPRESSION: 1. Severe stigmata of pancreatitis, significantly worsened compared to prior examination, with essentially no normal pancreatic parenchyma remaining, and new, large complex fluid collection overlying the ventral pancreatic neck, body, and tail, and posterior to the greater curvature of the stomach measuring 10.1 x 5.5 cm. 2. Multiple additional complex, multiloculated rim enhancing fluid collections throughout the adjacent small bowel mesentery, left upper quadrant, and lesser sac, likewise new compared to prior examination. 3. Constellation of findings is consistent with multiple acute pancreatic fluid collections and/or pseudocysts. Essentially all of the change has developed in the interval to examination dated 07/31/2023 and there is extensive associated inflammatory fat stranding throughout this region of  the abdomen, consistent with active inflammation. 4. 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   ASSESSMENT  Mr. Valdiviez is a 43 y.o. 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  The patient is seen today for evaluation and management of:  1. Alcohol-induced acute pancreatitis without infection or necrosis   2. Pseudocyst of pancreas   3. Splenic varices   4. Unintentional weight loss   5. Early satiety   6. Imaging of gastrointestinal tract abnormal    The patient is hemodynamically stable.  Clinically, he looks much better than 1 would expect with the most recent cross-sectional imaging that he had last month.  I am interested in knowing how his abdomen and pelvis look now that it has been a few weeks since his last imaging study.  He could still be a candidate for cystgastrostomy with some symptoms of early satiety, though he has had some continued improvement in regards to his pain and discomfort in his ability to eat.  The most important thing is that he has stopped drinking alcohol completely.  Will need to continue to work on maintaining tobacco cessation.  If repeat cross-sectional imaging still shows extensive changes and fluid throughout the abdomen and fluid collections, he may need to have his procedures completed at [facility] (where he has been referred due to access to multiple advanced endoscopist if needed).  The question of whether PD disruption could be occurring in a reason why he has persisting symptoms needs to be still be considered in weighed.  If there is significant mount of fluid in the abdomen we will need to see if he can have an ascites tap.  The risks of an EUS,  including intestinal perforation, bleeding, infection, aspiration, and medication effects were discussed.  When a cystgastrostomy/cystenterostomy is performed as part of the EUS, there is an additional risk of pancreatitis at the rate of about 1-2%.  It was explained that procedure related pancreatitis is typically mild, although  at times it can be severe and even life threatening.  I am not sure if he is going to need a pancreatic ERCP or not as at this time we briefly discussed what that could involve.  The risks of an ERCP were discussed at length, including but not limited to the risk of perforation, bleeding, abdominal pain, post-ERCP pancreatitis (while usually mild can be severe and even life threatening).  For now we will move forward with imaging and monitor and keep the current date for potential EUS later this month.  All patient questions were answered to the best of my ability, and the patient agrees to the aforementioned plan of action with follow-up as indicated.   PLAN  Proceed with CTAP with oral and IV contrast Will keep current EUS date for potential cystgastrostomy - Will need to make decision based on imaging whether this is feasible in town or if he will need multiple endoscopic therapies that may require a quaternary center Congratulated again on continued abstinence of alcohol Recommend complete cessation of all tobacco use Recommend MVI/folate/thiamine  Continue Creon  - Consider increasing to 72,000 units with each meal and 36,000 units with each snack   Orders Placed This Encounter  Procedures   CT ABDOMEN PELVIS W CONTRAST    New Prescriptions   No medications on file   Modified Medications   No medications on file    Planned Follow Up No follow-ups on file.   Total Time in Face-to-Face and in Coordination of Care for patient including independent/personal interpretation/review of prior testing, medical history, examination, medication adjustment, communicating  results with the patient directly, and documentation within the EHR is greater than 40 minutes.   Yong Henle, MD [facility] Advanced [facility]oscopy Office # [phone]

## 2023-12-09 ENCOUNTER — Other Ambulatory Visit: Payer: Self-pay

## 2023-12-09 ENCOUNTER — Other Ambulatory Visit: Payer: Self-pay | Admitting: Nurse Practitioner

## 2023-12-09 DIAGNOSIS — R188 Other ascites: Secondary | ICD-10-CM

## 2023-12-09 MED ORDER — PANCRELIPASE (LIP-PROT-AMYL) 36000-114000 UNITS PO CPEP
36000.0000 [IU] | ORAL_CAPSULE | Freq: Three times a day (TID) | ORAL | 0 refills | Status: DC
  Filled 2023-12-09: qty 100, 31d supply, fill #0
  Filled 2024-01-07: qty 100, 31d supply, fill #1
  Filled 2024-02-05: qty 100, 31d supply, fill #2

## 2023-12-11 ENCOUNTER — Ambulatory Visit: Payer: Self-pay | Admitting: *Deleted

## 2023-12-11 ENCOUNTER — Encounter (HOSPITAL_COMMUNITY): Payer: Self-pay | Admitting: Gastroenterology

## 2023-12-11 NOTE — Progress Notes (Signed)
 Attempted to obtain medical history for pre op call via telephone, unable to reach at this time. HIPAA compliant voicemail message left requesting return call to pre surgical testing department.

## 2023-12-12 ENCOUNTER — Other Ambulatory Visit: Payer: Self-pay

## 2023-12-12 ENCOUNTER — Ambulatory Visit (HOSPITAL_COMMUNITY)
Admission: RE | Admit: 2023-12-12 | Discharge: 2023-12-12 | Disposition: A | Discharge status: Home / Self Care | Source: Ambulatory Visit | Attending: Gastroenterology | Admitting: Gastroenterology

## 2023-12-12 ENCOUNTER — Telehealth: Payer: Self-pay | Admitting: Gastroenterology

## 2023-12-12 DIAGNOSIS — R188 Other ascites: Secondary | ICD-10-CM | POA: Diagnosis present

## 2023-12-12 HISTORY — PX: IR PARACENTESIS: IMG2679

## 2023-12-12 LAB — PROTEIN, PLEURAL OR PERITONEAL FLUID: Total protein, fluid: 5.4 g/dL

## 2023-12-12 LAB — BODY FLUID CELL COUNT WITH DIFFERENTIAL
Eos, Fluid: 0 %
Lymphs, Fluid: 73 %
Monocyte-Macrophage-Serous Fluid: 26 % — ABNORMAL LOW (ref 50–90)
Neutrophil Count, Fluid: 1 % (ref 0–25)
Total Nucleated Cell Count, Fluid: 2410 uL — ABNORMAL HIGH (ref 0–1000)

## 2023-12-12 LAB — ALBUMIN, PLEURAL OR PERITONEAL FLUID: Albumin, Fluid: 3.3 g/dL

## 2023-12-12 LAB — AMYLASE, PLEURAL OR PERITONEAL FLUID: Amylase, Fluid: 336 U/L

## 2023-12-12 LAB — LACTATE DEHYDROGENASE, PLEURAL OR PERITONEAL FLUID: LD, Fluid: 420 U/L — ABNORMAL HIGH (ref 3–23)

## 2023-12-12 MED ORDER — LIDOCAINE HCL 1 % IJ SOLN
INTRAMUSCULAR | Status: AC
  Filled 2023-12-12: qty 20

## 2023-12-12 NOTE — Telephone Encounter (Signed)
 Procedure:Upper Endoscopic Ultrasound Procedure date: 12/19/23 Procedure location: [facility] Arrival Time: 6:30 am Spoke with the patient Y/N: Yes Any prep concerns? No  Has the patient obtained the prep from the pharmacy ? No prep needed Do you have a care partner and transportation: Yes Any additional concerns? No

## 2023-12-12 NOTE — Procedures (Addendum)
 PROCEDURE SUMMARY:  Successful US  guided paracentesis from left lateral abdomen.  Yielded 130cc of dark brown fluid.  No immediate complications.  Patient tolerated well.  EBL = trace  Specimen sent for labs.  Eimi Viney Alonzo Artis PA-C 12/12/2023 9:34 AM

## 2023-12-15 ENCOUNTER — Ambulatory Visit: Payer: Self-pay | Admitting: Gastroenterology

## 2023-12-15 LAB — LIPASE, FLUID: Lipase-Fluid: 55 U/L

## 2023-12-15 LAB — BODY FLUID CULTURE W GRAM STAIN
Culture: NO GROWTH
Gram Stain: NONE SEEN

## 2023-12-15 LAB — PATHOLOGIST SMEAR REVIEW: Path Review: REACTIVE

## 2023-12-19 ENCOUNTER — Encounter (HOSPITAL_COMMUNITY): Payer: Self-pay | Admitting: Gastroenterology

## 2023-12-19 ENCOUNTER — Ambulatory Visit (HOSPITAL_COMMUNITY)
Admission: RE | Admit: 2023-12-19 | Discharge: 2023-12-19 | Disposition: A | Discharge status: Home / Self Care | Attending: Gastroenterology | Admitting: Gastroenterology

## 2023-12-19 ENCOUNTER — Encounter (HOSPITAL_COMMUNITY)
Admission: RE | Disposition: A | Discharge status: Home / Self Care | Payer: Self-pay | Source: Home / Self Care | Attending: Gastroenterology

## 2023-12-19 ENCOUNTER — Ambulatory Visit (HOSPITAL_COMMUNITY): Admitting: Anesthesiology

## 2023-12-19 ENCOUNTER — Ambulatory Visit (HOSPITAL_BASED_OUTPATIENT_CLINIC_OR_DEPARTMENT_OTHER): Admitting: Anesthesiology

## 2023-12-19 ENCOUNTER — Other Ambulatory Visit: Payer: Self-pay

## 2023-12-19 DIAGNOSIS — F17210 Nicotine dependence, cigarettes, uncomplicated: Secondary | ICD-10-CM

## 2023-12-19 DIAGNOSIS — I10 Essential (primary) hypertension: Secondary | ICD-10-CM | POA: Insufficient documentation

## 2023-12-19 DIAGNOSIS — R948 Abnormal results of function studies of other organs and systems: Secondary | ICD-10-CM | POA: Diagnosis present

## 2023-12-19 DIAGNOSIS — K3189 Other diseases of stomach and duodenum: Secondary | ICD-10-CM

## 2023-12-19 DIAGNOSIS — F17200 Nicotine dependence, unspecified, uncomplicated: Secondary | ICD-10-CM | POA: Diagnosis not present

## 2023-12-19 DIAGNOSIS — K862 Cyst of pancreas: Secondary | ICD-10-CM | POA: Diagnosis not present

## 2023-12-19 DIAGNOSIS — K869 Disease of pancreas, unspecified: Secondary | ICD-10-CM

## 2023-12-19 DIAGNOSIS — K859 Acute pancreatitis without necrosis or infection, unspecified: Secondary | ICD-10-CM | POA: Diagnosis not present

## 2023-12-19 DIAGNOSIS — K838 Other specified diseases of biliary tract: Secondary | ICD-10-CM | POA: Diagnosis not present

## 2023-12-19 DIAGNOSIS — K2970 Gastritis, unspecified, without bleeding: Secondary | ICD-10-CM | POA: Diagnosis not present

## 2023-12-19 DIAGNOSIS — K2289 Other specified disease of esophagus: Secondary | ICD-10-CM

## 2023-12-19 DIAGNOSIS — K8689 Other specified diseases of pancreas: Secondary | ICD-10-CM

## 2023-12-19 DIAGNOSIS — Z8719 Personal history of other diseases of the digestive system: Secondary | ICD-10-CM

## 2023-12-19 DIAGNOSIS — K852 Alcohol induced acute pancreatitis without necrosis or infection: Secondary | ICD-10-CM

## 2023-12-19 DIAGNOSIS — I899 Noninfective disorder of lymphatic vessels and lymph nodes, unspecified: Secondary | ICD-10-CM

## 2023-12-19 HISTORY — PX: ESOPHAGOGASTRODUODENOSCOPY: SHX5428

## 2023-12-19 HISTORY — PX: FINE NEEDLE ASPIRATION: SHX6590

## 2023-12-19 HISTORY — PX: EUS: SHX5427

## 2023-12-19 SURGERY — ULTRASOUND, UPPER GI TRACT, ENDOSCOPIC
Anesthesia: Monitor Anesthesia Care

## 2023-12-19 MED ORDER — CIPROFLOXACIN IN D5W 400 MG/200ML IV SOLN
INTRAVENOUS | Status: AC
  Filled 2023-12-19: qty 200

## 2023-12-19 MED ORDER — LACTATED RINGERS IV SOLN
INTRAVENOUS | Status: AC | PRN
  Administered 2023-12-19: 20 mL/h via INTRAVENOUS

## 2023-12-19 MED ORDER — SUGAMMADEX SODIUM 200 MG/2ML IV SOLN
INTRAVENOUS | Status: DC | PRN
  Administered 2023-12-19: 100 mg via INTRAVENOUS

## 2023-12-19 MED ORDER — ROCURONIUM BROMIDE 100 MG/10ML IV SOLN
INTRAVENOUS | Status: DC | PRN
  Administered 2023-12-19: 40 mg via INTRAVENOUS

## 2023-12-19 MED ORDER — DEXAMETHASONE SODIUM PHOSPHATE 10 MG/ML IJ SOLN
INTRAMUSCULAR | Status: DC | PRN
  Administered 2023-12-19: 10 mg via INTRAVENOUS

## 2023-12-19 MED ORDER — PHENYLEPHRINE 80 MCG/ML (10ML) SYRINGE FOR IV PUSH (FOR BLOOD PRESSURE SUPPORT)
PREFILLED_SYRINGE | INTRAVENOUS | Status: DC | PRN
Start: 2023-12-19 — End: 2023-12-19
  Administered 2023-12-19: 160 ug via INTRAVENOUS

## 2023-12-19 MED ORDER — PROPOFOL 10 MG/ML IV BOLUS
INTRAVENOUS | Status: DC | PRN
  Administered 2023-12-19: 200 mg via INTRAVENOUS

## 2023-12-19 MED ORDER — FENTANYL CITRATE (PF) 100 MCG/2ML IJ SOLN
INTRAMUSCULAR | Status: AC
  Filled 2023-12-19: qty 2

## 2023-12-19 MED ORDER — ONDANSETRON HCL 4 MG/2ML IJ SOLN
INTRAMUSCULAR | Status: DC | PRN
  Administered 2023-12-19: 4 mg via INTRAVENOUS

## 2023-12-19 MED ORDER — MIDAZOLAM HCL 2 MG/2ML IJ SOLN
INTRAMUSCULAR | Status: AC
  Filled 2023-12-19: qty 2

## 2023-12-19 MED ORDER — MIDAZOLAM HCL 5 MG/5ML IJ SOLN
INTRAMUSCULAR | Status: DC | PRN
  Administered 2023-12-19: 2 mg via INTRAVENOUS

## 2023-12-19 MED ORDER — CIPROFLOXACIN IN D5W 400 MG/200ML IV SOLN
INTRAVENOUS | Status: DC | PRN
  Administered 2023-12-19: 400 mg via INTRAVENOUS

## 2023-12-19 MED ORDER — SODIUM CHLORIDE 0.9 % IV SOLN: INTRAVENOUS | Status: DC

## 2023-12-19 MED ORDER — CIPROFLOXACIN HCL 500 MG PO TABS
500.0000 mg | ORAL_TABLET | Freq: Two times a day (BID) | ORAL | 0 refills | Status: AC
Start: 2023-12-19 — End: 2023-12-22

## 2023-12-19 MED ORDER — FENTANYL CITRATE (PF) 100 MCG/2ML IJ SOLN
INTRAMUSCULAR | Status: DC | PRN
  Administered 2023-12-19: 100 ug via INTRAVENOUS

## 2023-12-19 MED ORDER — LIDOCAINE HCL (CARDIAC) PF 100 MG/5ML IV SOSY
PREFILLED_SYRINGE | INTRAVENOUS | Status: DC | PRN
  Administered 2023-12-19: 100 mg via INTRAVENOUS

## 2023-12-19 NOTE — Anesthesia Preprocedure Evaluation (Addendum)
 Anesthesia Evaluation  Patient identified by MRN, date of birth, ID band Patient awake    Reviewed: Allergy & Precautions, NPO status , Patient's Chart, lab work & pertinent test results  Airway Mallampati: II  TM Distance: >3 FB Neck ROM: Full    Dental  (+) Dental Advisory Given   Pulmonary Current Smoker   breath sounds clear to auscultation       Cardiovascular hypertension, Pt. on medications and Pt. on home beta blockers  Rhythm:Regular Rate:Normal     Neuro/Psych negative neurological ROS     GI/Hepatic negative GI ROS,,,(+)     substance abuse  alcohol use  Endo/Other  negative endocrine ROS    Renal/GU negative Renal ROS     Musculoskeletal   Abdominal   Peds  Hematology negative hematology ROS (+)   Anesthesia Other Findings   Reproductive/Obstetrics                             Anesthesia Physical Anesthesia Plan  ASA: 3  Anesthesia Plan: General   Post-op Pain Management: Minimal or no pain anticipated   Induction: Intravenous  PONV Risk Score and Plan: 0 and Treatment may vary due to age or medical condition, Dexamethasone  and Ondansetron   Airway Management Planned: Oral ETT  Additional Equipment:   Intra-op Plan:   Post-operative Plan: Extubation in OR  Informed Consent: I have reviewed the patients History and Physical, chart, labs and discussed the procedure including the risks, benefits and alternatives for the proposed anesthesia with the patient or authorized representative who has indicated his/her understanding and acceptance.       Plan Discussed with: CRNA  Anesthesia Plan Comments:        Anesthesia Quick Evaluation

## 2023-12-19 NOTE — Anesthesia Procedure Notes (Signed)
 Procedure Name: Intubation Date/Time: 12/19/2023 8:13 AM  Performed by: [place], Avi Lemma, CRNAPre-anesthesia Checklist: Patient identified, Emergency Drugs available, Suction available and Patient being monitored Patient Re-evaluated:Patient Re-evaluated prior to induction Oxygen Delivery Method: Circle system utilized Preoxygenation: Pre-oxygenation with 100% oxygen Induction Type: IV induction Ventilation: Mask ventilation without difficulty Laryngoscope Size: Mac and 3 Grade View: Grade I Tube type: Oral Tube size: 7.5 mm Number of attempts: 1 Airway Equipment and Method: Stylet and Oral airway Placement Confirmation: ETT inserted through vocal cords under direct vision, positive ETCO2 and breath sounds checked- equal and bilateral Secured at: 21 cm Tube secured with: Tape Dental Injury: Teeth and Oropharynx as per pre-operative assessment

## 2023-12-19 NOTE — Transfer of Care (Signed)
 Immediate Anesthesia Transfer of Care Note  Patient: Juan Crawford  Procedure(s) Performed: ULTRASOUND, UPPER GI TRACT, ENDOSCOPIC EGD (ESOPHAGOGASTRODUODENOSCOPY) FINE NEEDLE ASPIRATION  Patient Location: PACU and Endoscopy Unit  Anesthesia Type:General  Level of Consciousness: awake, alert , and oriented  Airway & Oxygen Therapy: Patient Spontanous Breathing  Post-op Assessment: Report given to RN and Post -op Vital signs reviewed and stable  Post vital signs: Reviewed and stable  Last Vitals:  Vitals Value Taken Time  BP 115/50 12/19/23 0910  Temp    Pulse 78 12/19/23 0911  Resp 14 12/19/23 0911  SpO2 100 % 12/19/23 0911  Vitals shown include unfiled device data.  Last Pain:  Vitals:   12/19/23 0657  TempSrc: Temporal  PainSc: 0-No pain         Complications: No notable events documented.

## 2023-12-19 NOTE — Op Note (Addendum)
 [facility] Patient Name: Juan Crawford Procedure Date: 12/19/2023 MRN: [id number] Attending MD: Yong Henle , MD, [id number] Date of Birth: 09/23/1980 CSN: [id number] Age: 43 Admit Type: Outpatient Procedure:                Upper EUS Indications:              Pancreatic cyst on CT scan, Acute recurrent                            pancreatitis, For cyst aspiration, For cyst                            enterostomy Providers:                Yong Henle, MD, Domique Caffey, Rinda Cheers, Technician Referring MD:              Medicines:                General Anesthesia Complications:            No immediate complications. Estimated Blood Loss:     Estimated blood loss was minimal. Procedure:                Pre-Anesthesia Assessment:                           - Prior to the procedure, a History and Physical                            was performed, and patient medications and                            allergies were reviewed. The patient's tolerance of                            previous anesthesia was also reviewed. The risks                            and benefits of the procedure and the sedation                            options and risks were discussed with the patient.                            All questions were answered, and informed consent                            was obtained. Prior Anticoagulants: The patient has                            taken no anticoagulant or antiplatelet agents. ASA                            Grade Assessment: III - A  patient with severe                            systemic disease. After reviewing the risks and                            benefits, the patient was deemed in satisfactory                            condition to undergo the procedure.                           After obtaining informed consent, the endoscope was                            passed under direct vision. Throughout  the                            procedure, the patient's blood pressure, pulse, and                            oxygen saturations were monitored continuously. The                            [id number]-H190 ([id number]) Olympus endoscope was introduced                            through the mouth, and advanced to the second part                            of duodenum. The [id number] ([id number]) Olympus linear                            ultrasound scope was introduced through the mouth,                            and advanced to the duodenum for ultrasound                            examination from the stomach and duodenum. The                            upper EUS was accomplished without difficulty. The                            patient tolerated the procedure. Scope In: Scope Out: Findings:      ENDOSCOPIC FINDING: :      No gross lesions were noted in the entire esophagus.      The Z-line was irregular and was found 40 cm from the incisors.      Patchy mild inflammation characterized by erosions and erythema was       found in the entire examined stomach. Biopsies were taken with a cold       forceps for histology and Helicobacter pylori testing.  No gross lesions were noted in the duodenal bulb, in the first portion       of the duodenum and in the second portion of the duodenum.      The major papilla was normal.      ENDOSONOGRAPHIC FINDING: :      An anechoic lesion suggestive of a cyst was identified in the pancreatic       body. It is not in obvious communication with the pancreatic duct. The       lesion measured 55 mm by 28 mm in maximal cross-sectional diameter.       There was a single compartment without septae. The outer wall of the       lesion was thin. There was no associated mass. There was very minimal       amount of internal debris within the fluid-filled cavity. Diagnostic and       therapeutic needle aspiration for fluid was performed. Color Doppler       imaging was  utilized prior to needle puncture to confirm a lack of       significant vascular structures within the needle path. One pass was       made with the 19 gauge needle using a transgastric approach. No stylet       was used. The amount of fluid collected was 45 mL. The fluid was turbid,       brown and watery. Sample(s) were sent for glucose, amylase       concentration, cytology and CEA.      Pancreatic parenchymal abnormalities were noted in the entire pancreas.       These consisted of atrophy, lobularity without honeycombing and       hyperechoic strands.      The pancreatic duct had a tortuous/ectatic appearance and had       hyperechoic walls in the entire pancreas. Overall the size of the       pancreas duct was normal throughout with just very minimal prominence in       the tail.      There was dilation in the common bile duct and in the common hepatic       duct.      Endosonographic imaging of the ampulla showed no intramural       (subepithelial) lesion.      Endosonographic imaging in the visualized portion of the liver showed no       mass.      No malignant-appearing lymph nodes were visualized in the celiac region       (level 20), peripancreatic region and porta hepatis region.      The celiac region was visualized. Impression:               EGD impression:                           - No gross lesions in the entire esophagus. Z-line                            irregular, 40 cm from the incisors.                           - Gastritis. Biopsied.                           -  No gross lesions in the duodenal bulb, in the                            first portion of the duodenum and in the second                            portion of the duodenum.                           - Normal major papilla.                           EUS impression:                           - A cystic lesion was seen in the pancreatic body.                            Cytology results are pending. However,  the                            endosonographic appearance is consistent with a                            pancreatic pseudocyst with very minimal debris or                            necrosis. Fine needle aspiration for fluid                            aspiration                           - Pancreatic parenchymal abnormalities consisting                            of atrophy, lobularity and hyperechoic strands were                            noted in the entire pancreas.                           - The pancreatic duct had a tortuous/ectatic                            appearance and had hyperechoic walls in the entire                            pancreas. The duct itself however was relatively                            normal in size with just some slight prominence in                            the tail.                           -  There was dilation in the common bile duct and in                            the common hepatic duct. This had normal tapering                            without evidence of choledocholithiasis or                            stricture or mass. Gallbladder showed no evidence                            of cholelithiasis.                           - No malignant-appearing lymph nodes were                            visualized in the celiac region (level 20),                            peripancreatic region and porta hepatis region. Moderate Sedation:      Not Applicable - Patient had care per Anesthesia. Recommendation:           - The patient will be observed post-procedure,                            until all discharge criteria are met.                           - Discharge patient to home.                           - Patient has a contact number available for                            emergencies. The signs and symptoms of potential                            delayed complications were discussed with the                            patient. Return to normal  activities tomorrow.                            Written discharge instructions were provided to the                            patient.                           - Low fat diet.                           - Observe patient's clinical course.                           -  Await cytology results and await path results.                           - Pending patient's overall clinical course, hope                            would be that he continues to have improvement and                            after cyst aspiration today he will have                            improvement in his ability to tolerate oral intake.                            Recommend repeat cross-sectional imaging as well as                            follow-up with primary gastroenterologist in the                            next 4 to 6 weeks. Cross-sectional imaging could be                            a CT abdomen/pelvis with contrast IV/p.o.                           - I do not expect that the fluid analysis is going                            to show anything more than this being a pseudocyst,                            but if it shows other abnormalities in the fluid                            analysis we will update things from there.                           - Ciprofloxacin 500 mg twice daily x 3 days to                            decrease risk of postinfectious complications                           - The findings and recommendations were discussed                            with the patient.                           - The findings and recommendations were discussed  with the patient's family. Procedure Code(s):        --- Professional ---                           [phone], Esophagogastroduodenoscopy, flexible,                            transoral; with transendoscopic ultrasound-guided                            intramural or transmural fine needle                             aspiration/biopsy(s), (includes endoscopic                            ultrasound examination limited to the esophagus,                            stomach or duodenum, and adjacent structures)                           43239, 59, Esophagogastroduodenoscopy, flexible,                            transoral; with biopsy, single or multiple Diagnosis Code(s):        --- Professional ---                           K22.89, Other specified disease of esophagus                           K29.70, Gastritis, unspecified, without bleeding                           K86.2, Cyst of pancreas                           K86.9, Disease of pancreas, unspecified                           I89.9, Noninfective disorder of lymphatic vessels                            and lymph nodes, unspecified                           K85.90, Acute pancreatitis without necrosis or                            infection, unspecified                           R93.3, Abnormal findings on diagnostic imaging of                            other parts of digestive tract  K83.8, Other specified diseases of biliary tract CPT copyright 2022 American Medical Association. All rights reserved. The codes documented in this report are preliminary and upon coder review may  be revised to meet current compliance requirements. Yong Henle, MD 12/19/2023 9:22:30 AM Number of Addenda: 0

## 2023-12-19 NOTE — Discharge Instructions (Signed)

## 2023-12-19 NOTE — H&P (Addendum)
 GASTROENTEROLOGY PROCEDURE H&P NOTE   Primary Care Physician: Dawayne Estrin, PA-C  HPI: Juan Crawford is a 43 y.o. 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   Hypertension    Nicotine  dependence    Past Surgical History:  Procedure Laterality Date   CYST EXCISION Left 02/12/2019   Procedure: DEBRIDEMENT NECK WOUND AND PLACEMENT OF DRAIN;  Surgeon: Virgina Grills, MD;  Location: [facility];  Service: ENT;  Laterality: Left;   FACIAL LACERATION REPAIR N/A 12/21/2018   Procedure: Facial Laceration Repair;  Surgeon: Virgina Grills, MD;  Location: [facility] OR;  Service: ENT;  Laterality: N/A;  3cm wound on chin   HAND SURGERY  2001   IR PARACENTESIS  07/29/2023   IR PARACENTESIS  08/01/2023   IR PARACENTESIS  12/12/2023   MANDIBULAR HARDWARE REMOVAL Bilateral 02/12/2019   Procedure: MANDIBULAR HARDWARE REMOVAL;  Surgeon: Virgina Grills, MD;  Location: [facility];  Service: ENT;  Laterality: Bilateral;   ORIF MANDIBULAR FRACTURE N/A 12/21/2018   Procedure: MAXILLO-MANDIBULAR FIXATION AND ORIF OF MANDIBLE FRACTURE;  Surgeon: Virgina Grills, MD;  Location: [facility] OR;  Service: ENT;  Laterality: N/A;   No current facility-administered medications for this encounter.   No current facility-administered medications for this encounter. Allergies  Allergen Reactions   Gadolinium Derivatives Hives and Itching    Patient received Gadavist  contrast, a few minutes after receiving contrast patient got hives and itching on neck, chest and left shoulder. Pt treated with Benadryl .    Family History  Problem Relation Age of Onset   Diabetes Neg Hx    Colon cancer Neg Hx    Esophageal cancer Neg Hx    Inflammatory bowel disease Neg Hx    Liver disease Neg Hx     Pancreatic cancer Neg Hx    Rectal cancer Neg Hx    Stomach cancer Neg Hx    Social History   Socioeconomic History   Marital status: Single    Spouse name: Not on file   Number of children: Not on file   Years of education: Not on file   Highest education level: Not on file  Occupational History   Not on file  Tobacco Use   Smoking status: Every Day    Current packs/day: 1.00    Average packs/day: 1 pack/day for 20.0 years (20.0 ttl pk-yrs)    Types: E-cigarettes, Cigarettes   Smokeless tobacco: Never  Vaping Use   Vaping status: Never Used  Substance and Sexual Activity   Alcohol use: Yes    Alcohol/week: 6.0 standard drinks of alcohol    Types: 6 Shots of liquor per week    Comment: heavily   Drug use: Yes    Frequency: 3.0 times per week    Types: Marijuana    Comment: last smoked 02/11/2019   Sexual activity: Not Currently  Other Topics Concern   Not on file  Social History Narrative   Not on file   Social Drivers of Health   Financial Resource Strain: Low Risk  (10/09/2023)   Received from [facility]   Overall Financial Resource Strain (CARDIA)    Difficulty of Paying Living Expenses: Not very hard  Food Insecurity: No Food Insecurity (10/09/2023)   Received from [facility]   Hunger Vital Sign    Worried About Running  Out of Food in the Last Year: Never true    Ran Out of Food in the Last Year: Never true  Transportation Needs: No Transportation Needs (10/09/2023)   Received from [facility]   PRAPARE - Transportation    Lack of Transportation (Medical): No    Lack of Transportation (Non-Medical): No  Physical Activity: Unknown (10/09/2023)   Received from [facility]   Exercise Vital Sign    Days of Exercise per Week: 0 days    Minutes of Exercise per Session: Not on file  Stress: Stress Concern Present (10/09/2023)   Received from [facility] - Occupational Stress Questionnaire    Feeling of Stress :  To some extent  Social Connections: Moderately Integrated (10/09/2023)   Received from [facility]   Social Network    How would you rate your social network (family, work, friends)?: Adequate participation with social networks  Intimate Partner Violence: Not At Risk (10/09/2023)   Received from [facility]   HITS    Over the last 12 months how often did your partner physically hurt you?: Never    Over the last 12 months how often did your partner insult you or talk down to you?: Never    Over the last 12 months how often did your partner threaten you with physical harm?: Never    Over the last 12 months how often did your partner scream or curse at you?: Never    Physical Exam: There were no vitals filed for this visit. There is no height or weight on file to calculate BMI. GEN: NAD EYE: Sclerae anicteric ENT: MMM CV: Non-tachycardic GI: Soft, NT/ND NEURO:  Alert & Oriented x 3  Lab Results: No results for input(s): "WBC", "HGB", "HCT", "PLT" in the last 72 hours. BMET No results for input(s): "NA", "K", "CL", "CO2", "GLUCOSE", "BUN", "CREATININE", "CALCIUM" in the last 72 hours. LFT No results for input(s): "PROT", "ALBUMIN ", "AST", "ALT", "ALKPHOS", "BILITOT", "BILIDIR", "IBILI" in the last 72 hours. PT/INR No results for input(s): "LABPROT", "INR" in the last 72 hours.   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  The risks of an EUS, including intestinal perforation, bleeding, infection, aspiration, and medication effects were discussed.  When a cystgastrostomy/cystenterostomy is performed as part of the EUS, there is an additional risk of pancreatitis at the rate of about 1-2%.  It was explained that procedure related pancreatitis is typically mild, although at times it can be severe and even life  threatening.  The risks and benefits of endoscopic evaluation/treatment were discussed with the patient and/or family; these include but are not limited to the risk of perforation, infection, bleeding, missed lesions, lack of diagnosis, severe illness requiring hospitalization, as well as anesthesia and sedation related illnesses.  The patient's history has been reviewed, patient examined, no change in status, and deemed stable for procedure.  The patient and/or family is agreeable to proceed.   Rediscussed.    Yong Henle, MD [facility] Advanced Endoscopy Office # [phone]

## 2023-12-20 ENCOUNTER — Ambulatory Visit: Payer: Self-pay | Admitting: Gastroenterology

## 2023-12-20 DIAGNOSIS — Z8719 Personal history of other diseases of the digestive system: Secondary | ICD-10-CM

## 2023-12-20 DIAGNOSIS — K8689 Other specified diseases of pancreas: Secondary | ICD-10-CM

## 2023-12-20 DIAGNOSIS — K2970 Gastritis, unspecified, without bleeding: Secondary | ICD-10-CM

## 2023-12-20 DIAGNOSIS — K852 Alcohol induced acute pancreatitis without necrosis or infection: Secondary | ICD-10-CM

## 2023-12-20 LAB — SURGICAL PATHOLOGY

## 2023-12-23 ENCOUNTER — Encounter (HOSPITAL_COMMUNITY): Payer: Self-pay | Admitting: Gastroenterology

## 2023-12-24 LAB — CYTOLOGY - NON PAP

## 2023-12-24 NOTE — Anesthesia Postprocedure Evaluation (Signed)
 Anesthesia Post Note  Patient: Juan Crawford  Procedure(s) Performed: ULTRASOUND, UPPER GI TRACT, ENDOSCOPIC EGD (ESOPHAGOGASTRODUODENOSCOPY) FINE NEEDLE ASPIRATION     Patient location during evaluation: PACU Anesthesia Type: MAC Level of consciousness: awake and alert Pain management: pain level controlled Vital Signs Assessment: post-procedure vital signs reviewed and stable Respiratory status: spontaneous breathing, nonlabored ventilation, respiratory function stable and patient connected to nasal cannula oxygen Cardiovascular status: stable and blood pressure returned to baseline Postop Assessment: no apparent nausea or vomiting Anesthetic complications: no   No notable events documented.  Last Vitals:  Vitals:   12/19/23 0930 12/19/23 0940  BP: 128/82 (!) 122/90  Pulse: 62 62  Resp: 13 13  Temp:    SpO2: 98% 96%    Last Pain:  Vitals:   12/19/23 0940  TempSrc:   PainSc: 0-No pain                 Willian Harrow

## 2023-12-26 ENCOUNTER — Telehealth: Payer: Self-pay

## 2023-12-26 NOTE — Telephone Encounter (Signed)
 You may cancel the referral. He no longer needs it at this time. Thanks. GM

## 2023-12-26 NOTE — Telephone Encounter (Signed)
 Received fax from [facility] that they contacted the patient but he was not interested in seeing them at this time. The referral will stay open for 12 months. Contact: [phone]

## 2024-01-07 ENCOUNTER — Other Ambulatory Visit: Payer: Self-pay

## 2024-01-09 ENCOUNTER — Other Ambulatory Visit: Payer: Self-pay

## 2024-01-10 ENCOUNTER — Encounter: Payer: Self-pay | Admitting: Gastroenterology

## 2024-01-10 NOTE — Progress Notes (Signed)
 [facility] laboratories Pancreatic fluid analysis  Amylase 12,895 units/L CEA 13 ng/mL Glucose 114 mg/dL  These fluid studies show evidence of being a pseudocyst.  These records will be scanned in the chart and accessible for the patient via mail for his records.  I recommend a repeat CT abdomen pelvis with IV and oral contrast by August 2025 (put under Dr. Tena Feeling name).  I will have my team reach out to the patient and update with recommendations.   Yong Henle, MD [facility] Advanced Endoscopy Office # [phone]

## 2024-01-10 NOTE — Progress Notes (Signed)
 The pt has been advised and staff message sent to Team A for CT

## 2024-01-30 ENCOUNTER — Encounter: Payer: Self-pay | Admitting: Gastroenterology

## 2024-01-30 ENCOUNTER — Telehealth: Payer: Self-pay | Admitting: *Deleted

## 2024-01-30 DIAGNOSIS — K863 Pseudocyst of pancreas: Secondary | ICD-10-CM

## 2024-01-30 DIAGNOSIS — R948 Abnormal results of function studies of other organs and systems: Secondary | ICD-10-CM

## 2024-01-30 NOTE — Telephone Encounter (Signed)
-----   Message from Nurse Patty P sent at 01/10/2024  3:52 PM EDT ----- CT scan by late July early August (under Dr. Hassan name).

## 2024-01-30 NOTE — Telephone Encounter (Signed)
 Patient confirmed appt.

## 2024-01-30 NOTE — Telephone Encounter (Signed)
 Patient has been scheduled for CT abdomen/pelvis at [facility] on 03/03/24 at 930 am, 915 am arrival, no prep.   Left message for patient to call back.

## 2024-02-05 ENCOUNTER — Other Ambulatory Visit: Payer: Self-pay | Admitting: Gastroenterology

## 2024-02-05 ENCOUNTER — Other Ambulatory Visit: Payer: Self-pay

## 2024-02-05 ENCOUNTER — Other Ambulatory Visit (HOSPITAL_BASED_OUTPATIENT_CLINIC_OR_DEPARTMENT_OTHER): Payer: Self-pay

## 2024-02-05 MED ORDER — PANCRELIPASE (LIP-PROT-AMYL) 36000-114000 UNITS PO CPEP
36000.0000 [IU] | ORAL_CAPSULE | Freq: Three times a day (TID) | ORAL | 0 refills | Status: DC
  Filled 2024-02-05: qty 90, 30d supply, fill #0
  Filled 2024-02-05 (×2): qty 100, 31d supply, fill #0
  Filled 2024-03-09: qty 100, 31d supply, fill #1
  Filled 2024-04-04: qty 100, 31d supply, fill #2

## 2024-02-06 ENCOUNTER — Other Ambulatory Visit: Payer: Self-pay

## 2024-02-07 ENCOUNTER — Other Ambulatory Visit: Payer: Self-pay

## 2024-03-03 ENCOUNTER — Ambulatory Visit (HOSPITAL_COMMUNITY)

## 2024-03-10 ENCOUNTER — Ambulatory Visit (HOSPITAL_COMMUNITY)
Admission: RE | Admit: 2024-03-10 | Discharge: 2024-03-10 | Disposition: A | Discharge status: Home / Self Care | Source: Ambulatory Visit | Attending: Gastroenterology | Admitting: Gastroenterology

## 2024-03-10 ENCOUNTER — Other Ambulatory Visit: Payer: Self-pay

## 2024-03-10 DIAGNOSIS — E139 Other specified diabetes mellitus without complications: Secondary | ICD-10-CM | POA: Insufficient documentation

## 2024-03-10 DIAGNOSIS — E119 Type 2 diabetes mellitus without complications: Secondary | ICD-10-CM | POA: Insufficient documentation

## 2024-03-10 DIAGNOSIS — K863 Pseudocyst of pancreas: Secondary | ICD-10-CM | POA: Diagnosis present

## 2024-03-10 DIAGNOSIS — R948 Abnormal results of function studies of other organs and systems: Secondary | ICD-10-CM | POA: Diagnosis present

## 2024-03-10 LAB — POCT I-STAT CREATININE: Creatinine, Ser: 1.2 mg/dL (ref 0.61–1.24)

## 2024-03-10 MED ORDER — IOHEXOL 300 MG/ML  SOLN
100.0000 mL | Freq: Once | INTRAMUSCULAR | Status: AC | PRN
  Administered 2024-03-10 (×2): 100 mL via INTRAVENOUS

## 2024-03-23 ENCOUNTER — Ambulatory Visit: Payer: Self-pay | Admitting: Gastroenterology

## 2024-03-26 ENCOUNTER — Other Ambulatory Visit (HOSPITAL_COMMUNITY): Payer: Self-pay

## 2024-04-04 ENCOUNTER — Other Ambulatory Visit: Payer: Self-pay

## 2024-04-04 ENCOUNTER — Other Ambulatory Visit: Payer: Self-pay | Admitting: Gastroenterology

## 2024-04-06 ENCOUNTER — Other Ambulatory Visit: Payer: Self-pay

## 2024-04-06 MED ORDER — PANCRELIPASE (LIP-PROT-AMYL) 36000-114000 UNITS PO CPEP
36000.0000 [IU] | ORAL_CAPSULE | Freq: Three times a day (TID) | ORAL | 0 refills | Status: DC
  Filled 2024-04-06: qty 100, 31d supply, fill #0
  Filled 2024-05-06: qty 100, 31d supply, fill #1
  Filled 2024-06-04: qty 100, 31d supply, fill #2

## 2024-04-07 ENCOUNTER — Other Ambulatory Visit: Payer: Self-pay

## 2024-05-06 ENCOUNTER — Other Ambulatory Visit: Payer: Self-pay

## 2024-06-04 ENCOUNTER — Other Ambulatory Visit: Payer: Self-pay | Admitting: Gastroenterology

## 2024-06-04 ENCOUNTER — Other Ambulatory Visit: Payer: Self-pay

## 2024-06-05 ENCOUNTER — Other Ambulatory Visit: Payer: Self-pay

## 2024-06-05 MED ORDER — PANCRELIPASE (LIP-PROT-AMYL) 36000-114000 UNITS PO CPEP
72000.0000 [IU] | ORAL_CAPSULE | Freq: Three times a day (TID) | ORAL | 3 refills | Status: AC
  Filled 2024-06-05: qty 200, 25d supply, fill #0
  Filled 2024-06-25: qty 200, 25d supply, fill #1
  Filled 2024-07-22: qty 200, 25d supply, fill #2

## 2024-06-22 ENCOUNTER — Encounter: Payer: Self-pay | Admitting: Gastroenterology

## 2024-06-22 ENCOUNTER — Ambulatory Visit: Admitting: Gastroenterology

## 2024-06-22 ENCOUNTER — Other Ambulatory Visit (INDEPENDENT_AMBULATORY_CARE_PROVIDER_SITE_OTHER)

## 2024-06-22 VITALS — BP 130/74 | HR 78 | Ht 72.0 in | Wt 178.0 lb

## 2024-06-22 DIAGNOSIS — K863 Pseudocyst of pancreas: Secondary | ICD-10-CM

## 2024-06-22 DIAGNOSIS — D509 Iron deficiency anemia, unspecified: Secondary | ICD-10-CM

## 2024-06-22 DIAGNOSIS — K852 Alcohol induced acute pancreatitis without necrosis or infection: Secondary | ICD-10-CM | POA: Diagnosis not present

## 2024-06-22 LAB — IBC + FERRITIN
Ferritin: 92.4 ng/mL (ref 22.0–322.0)
Iron: 136 ug/dL (ref 42–165)
Saturation Ratios: 38.2 % (ref 20.0–50.0)
TIBC: 355.6 ug/dL (ref 250.0–450.0)
Transferrin: 254 mg/dL (ref 212.0–360.0)

## 2024-06-22 LAB — CBC WITH DIFFERENTIAL/PLATELET
Basophils Absolute: 0 K/uL (ref 0.0–0.1)
Basophils Relative: 0.7 % (ref 0.0–3.0)
Eosinophils Absolute: 0 K/uL (ref 0.0–0.7)
Eosinophils Relative: 0.8 % (ref 0.0–5.0)
HCT: 43.7 % (ref 39.0–52.0)
Hemoglobin: 15.2 g/dL (ref 13.0–17.0)
Lymphocytes Relative: 20 % (ref 12.0–46.0)
Lymphs Abs: 1.3 K/uL (ref 0.7–4.0)
MCHC: 34.7 g/dL (ref 30.0–36.0)
MCV: 87.7 fl (ref 78.0–100.0)
Monocytes Absolute: 0.5 K/uL (ref 0.1–1.0)
Monocytes Relative: 8.5 % (ref 3.0–12.0)
Neutro Abs: 4.5 K/uL (ref 1.4–7.7)
Neutrophils Relative %: 70 % (ref 43.0–77.0)
Platelets: 208 K/uL (ref 150.0–400.0)
RBC: 4.98 Mil/uL (ref 4.22–5.81)
RDW: 13 % (ref 11.5–15.5)
WBC: 6.4 K/uL (ref 4.0–10.5)

## 2024-06-22 MED ORDER — NA SULFATE-K SULFATE-MG SULF 17.5-3.13-1.6 GM/177ML PO SOLN: 1.0000 | Freq: Once | ORAL | 0 refills | Status: AC

## 2024-06-22 NOTE — Patient Instructions (Addendum)
 You have been scheduled for a colonoscopy with Dr. Leigh on 08-13-24. Please follow written instructions given to you at your visit today.   If you use inhalers (even only as needed), please bring them with you on the day of your procedure.  DO NOT TAKE 7 DAYS PRIOR TO TEST- Trulicity (dulaglutide) Ozempic, Wegovy (semaglutide) Mounjaro, Zepbound (tirzepatide) Bydureon Bcise (exanatide extended release)  DO NOT TAKE 1 DAY PRIOR TO YOUR TEST Rybelsus (semaglutide) Adlyxin (lixisenatide) Victoza (liraglutide) Byetta (exanatide) ___________________________________________________________________________  Stop folic acid .  Please go to the lab in the basement of our building to have lab work done as you leave today. Hit B for basement when you get on the elevator.  When the doors open the lab is on your left.  We will call you with the results. Thank you.   Thank you for entrusting me with your care and for choosing [facility], Dr. Elspeth Leigh    _______________________________________________________  If your blood pressure at your visit was 140/90 or greater, please contact your primary care physician to follow up on this.  _______________________________________________________  If you are age 72 or older, your body mass index should be between 23-30. Your Body mass index is 24.14 kg/m. If this is out of the aforementioned range listed, please consider follow up with your Primary Care Provider.  If you are age 74 or younger, your body mass index should be between 19-25. Your Body mass index is 24.14 kg/m. If this is out of the aformentioned range listed, please consider follow up with your Primary Care Provider.   ________________________________________________________  The [facility] providers would like to encourage you to use MYCHART to communicate with providers for non-urgent requests or questions.  Due to long hold times on the telephone, sending your  provider a message by [facility] may be a faster and more efficient way to get a response.  Please allow 48 business hours for a response.  Please remember that this is for non-urgent requests.  _______________________________________________________  [facility] is using a team-based approach to care.  Your team is made up of your doctor and two to three APPS. Our APPS (Nurse Practitioners and Physician Assistants) work with your physician to ensure care continuity for you. They are fully qualified to address your health concerns and develop a treatment plan. They communicate directly with your gastroenterologist to care for you. Seeing the Advanced Practice Practitioners on your physician's team can help you by facilitating care more promptly, often allowing for earlier appointments, access to diagnostic testing, procedures, and other specialty referrals.

## 2024-06-22 NOTE — Progress Notes (Signed)
 HPI :  43 year old male with a history of alcohol abuse, severe alcoholic pancreatitis with pseudocyst, iron deficiency, here for follow-up visit.  I have not seen him for several months.  Recall he was dealing with peripancreatic fluid collection/pseudocyst over several months earlier this year.  He was followed with imaging over time and eventually had an EUS with Dr. Wilhelmenia in May of this year.  Pancreatic cyst was aspirated, no evidence of malignancy or precancerous change, thought to have a pseudocyst.  He has been on Creon , and abstaining from alcohol, now 11 months sober.  He is feeling remarkably improved since have last seen him.  His abdominal pain has resolved.  He is eating well, he is gained over 30 pounds from his lowest weight.  He denies any problems with his bowels, no steatorrhea endorsed.  No nausea or vomiting.  He has stopped smoking cigarettes but does vape.  His last imaging of his pancreas was with a CT scan on August 12.  At that time there was interval resolution of previously seen pseudocysts.  His pancreas is atrophic appearing with a prominence of the PD but no mass lesions.  He was incidentally noted to have severe aortic atherosclerosis, was started on a statin by his primary care as well as metformin for elevated A1c.  Recall he does have a history of iron deficiency for which he was placed on iron tablets.  He did have an EGD at the time of his EUS with Dr. Wilhelmenia in May, no concerning findings there.  He was initially scheduled for a colonoscopy but was dealing with the peripancreatic fluid collections and that was postponed until he was feeling better.  He feels he is in a better place to do the colonoscopy at this point in time.  He has remained on iron, his CBC has not been checked since April at which time his hemoglobin was 12.1 and MCV was 78.  He denies any problems with his bowels or blood in his stools.  He otherwise works as a occupational hygienist in  [place] and able to work and function without any concerning problems right now.  He remains on Lasix  and Aldactone  per his primary care for management of edema and his blood pressure however he is doing much better in this regard and denies any peripheral edema.    Imaging Studies  11/04/2023 CTAP IMPRESSION: *Interval worsening in the size of the pseudocyst within the body of the pancreas along the lesser sac measuring today 8.2 x 7.5 cm in AP diameter compared to prior where it measured 5.8 x 7.3 cm. *Worsening developing fluid in the left paracolic gutter with the largest collection measuring at least 10 by 5.2 cm in the transverse and AP diameter extending all the way down into the pelvis. *Multiple small loculated fluid collections are noted along the superior margin of the left paracolic gutter lateral to the spleen the most posterior 1 measures 4.9 by 1.6 cm, the next 1 measures 3.2 x 1.7 cm the most anterior 1 measures 5 x 2.3 cm. This loculated fluid collections containing anteriorly along the gastrohepatic margin along the anterior aspect of the left lobe of the liver, with the largest of the collection measuring 13 by 3.7 by 2 cm in the transverse, AP and longitudinal diameter. *Minimal residual inflammatory changes surrounding the head of the pancreas and the adjacent peripancreatic soft tissues. Correlate clinically for infection to exclude infected pseudocyst. *Well-defined cystic fluid collection in the right posteromedial gluteal  soft tissues posterior to the right ischial rectal fossa measures 5.1 x 3.3 by 4 cm in the AP, transverse and longitudinal diameter. This correlates with the fluid density mass perhaps an abscess or other loculated fluid collections. Correlate clinically, this area is easily accessible for percutaneous aspiration.   10/02/2023 MRCP IMPRESSION: 1. Severe stigmata of pancreatitis, significantly worsened compared to prior examination, with  essentially no normal pancreatic parenchyma remaining, and new, large complex fluid collection overlying the ventral pancreatic neck, body, and tail, and posterior to the greater curvature of the stomach measuring 10.1 x 5.5 cm. 2. Multiple additional complex, multiloculated rim enhancing fluid collections throughout the adjacent small bowel mesentery, left upper quadrant, and lesser sac, likewise new compared to prior examination. 3. Constellation of findings is consistent with multiple acute pancreatic fluid collections and/or pseudocysts. Essentially all of the change has developed in the interval to examination dated 07/31/2023 and there is extensive associated inflammatory fat stranding throughout this region of the abdomen, consistent with active inflammation. 4. 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   CT abdomen / pelvis 12/06/2023: FINDINGS:   The lung bases are clear. The heart is normal in size. There are coronary calcifications. The liver appears normal. The gallbladder is normal. The spleen is normal. Inflammatory changes about the pancreas are improved compared to prior examination. The smaller pancreatic pseudocyst is resolved and the larger is decreased in size now measuring 5.8 x 5.1 cm (series 2 image 23). Other loosely organized fluid collection anterior to the descending colon appears decreased in size measuring 6.1 x 3.6 cm (series 2 image 41). Additional fluid collection the left upper quadrant is significantly smaller now measuring 4.4 x 1.1 cm (series 2 image 19). Smaller interloop collections are also significantly decreased in size.   The adrenals are normal. The kidneys are normal. Abdominal aorta is normal in caliber. The bladder is normal. The prostate is normal. The appendix is normal. Large and small bowel loops are otherwise within normal limits. There is no pathologic lymphadenopathy by size criteria.  There are mild degenerative changes of the spine and bony pelvis.   IMPRESSION:   Interval improvement in peripancreatic inflammatory changes with significantly improved pseudocysts as above.   EUS 12/19/2023: EGD impression: - No gross lesions in the entire esophagus. Z-line irregular, 40 cm from the incisors. - Gastritis. Biopsied. - No gross lesions in the duodenal bulb, in the first portion of the duodenum and in the second portion of the duodenum. - Normal major papilla.  EUS impression: -  ENDOSONOGRAPHIC FINDING: An anechoic lesion suggestive of a cyst was identified in the pancreatic body. It is not in obvious communication with the pancreatic duct. The lesion measured 55 mm by 28 mm in maximal crosssectional diameter. There was a single compartment without septae. The outer wall of the lesion was thin. There was no associated mass. There was very minimal amount of internal debris within the fluidfilled cavity. Diagnostic and therapeutic needle aspiration for fluid was performed. Color Doppler imaging was utilized prior to needle puncture to confirm a lack of significant vascular structures within the needle path. One pass was made with the 19 gauge needle using a transgastric approach. No stylet was used. The amount of fluid collected was 45 mL. The fluid was turbid, brown and watery. Sample(s) were sent for glucose, amylase concentration, cytology and CEA. Pancreatic parenchymal abnormalities were noted in the entire pancreas. These consisted of atrophy, lobularity without honeycombing and  hyperechoic strands. The pancreatic duct had a tortuous/ectatic appearance and had hyperechoic walls in the entire pancreas. Overall the size of the pancreas duct was normal throughout with just very minimal prominence in the tail. There was dilation in the common bile duct and in the common hepatic duct. Endosonographic imaging of the ampulla showed no intramural (subepithelial) lesion. Endosonographic  imaging in the visualized portion of the liver showed no mass. No malignant-appearing lymph nodes were visualized in the celiac region (level 20), peripancreatic region and porta hepatis region. The celiac region was visualized.   FINAL MICROSCOPIC DIAGNOSIS:   A. STOMACH, BIOPSY:       Gastric antral / oxyntic mucosa without significant diagnostic  alteration.      No H. pylori identified on HE stain.       Negative for intestinal metaplasia or dysplasia.   FINAL MICROSCOPIC DIAGNOSIS:  - No malignant cells identified  - Benign reactive/reparative changes    Amylase 87104 CEA 13  CT abdomen pelvis 03/10/24: IMPRESSION: 1. Interval resolution of multiple previously seen pseudocysts, most particularly interposed between the pancreatic tail and gastric fundus. 2. Unchanged mildly atrophic appearance of the pancreas with prominence of the pancreatic duct up to 0.4 cm. 3. No acute inflammatory findings. 4. Small volume pelvic ascites and small volume of interloop fluid, diminished compared to prior examination 5. Severe aortic atherosclerosis, markedly advanced for patient age.   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   Hypertension    Iron deficiency anemia    Nicotine  dependence    Pancreatic pseudocyst    Pancreatitis      Past Surgical History:  Procedure Laterality Date   CYST EXCISION Left 02/12/2019   Procedure: DEBRIDEMENT NECK WOUND AND PLACEMENT OF DRAIN;  Surgeon: Carlie Clark, MD;  Location: [facility];  Service: ENT;  Laterality: Left;   ESOPHAGOGASTRODUODENOSCOPY N/A 12/19/2023   Procedure: EGD (ESOPHAGOGASTRODUODENOSCOPY);  Surgeon: Wilhelmenia Aloha Raddle., MD;  Location: [facility];  Service: Gastroenterology;  Laterality: N/A;   EUS N/A 12/19/2023   Procedure: ULTRASOUND, UPPER GI TRACT, ENDOSCOPIC;  Surgeon: Wilhelmenia Aloha Raddle., MD;  Location: [facility];  Service: Gastroenterology;  Laterality:  N/A;   FACIAL LACERATION REPAIR N/A 12/21/2018   Procedure: Facial Laceration Repair;  Surgeon: Carlie Clark, MD;  Location: [facility];  Service: ENT;  Laterality: N/A;  3cm wound on chin   FINE NEEDLE ASPIRATION  12/19/2023   Procedure: FINE NEEDLE ASPIRATION;  Surgeon: Wilhelmenia Aloha Raddle., MD;  Location: [facility];  Service: Gastroenterology;;   HAND SURGERY  2001   IR PARACENTESIS  07/29/2023   IR PARACENTESIS  08/01/2023   IR PARACENTESIS  12/12/2023   MANDIBULAR HARDWARE REMOVAL Bilateral 02/12/2019   Procedure: MANDIBULAR HARDWARE REMOVAL;  Surgeon: Carlie Clark, MD;  Location: [facility];  Service: ENT;  Laterality: Bilateral;   ORIF MANDIBULAR FRACTURE N/A 12/21/2018   Procedure: MAXILLO-MANDIBULAR FIXATION AND ORIF OF MANDIBLE FRACTURE;  Surgeon: Carlie Clark, MD;  Location: [facility];  Service: ENT;  Laterality: N/A;   Family History  Problem Relation Age of Onset   Esophageal cancer Father        died age 51   Diabetes Neg Hx    Colon cancer Neg Hx    Inflammatory bowel disease Neg Hx    Liver disease Neg Hx    Pancreatic cancer Neg Hx    Rectal cancer Neg Hx    Stomach cancer Neg Hx    Social  History   Tobacco Use   Smoking status: Every Day    Types: E-cigarettes   Smokeless tobacco: Never  Vaping Use   Vaping status: Every Day  Substance Use Topics   Alcohol use: Not Currently   Drug use: Yes    Frequency: 3.0 times per week    Types: Marijuana   Current Outpatient Medications  Medication Sig Dispense Refill   ferrous sulfate 325 (65 FE) MG tablet Take 325 mg by mouth daily with breakfast.     folic acid  (FOLVITE ) 1 MG tablet Take 1 tablet (1 mg total) by mouth daily. 30 tablet 6   furosemide  (LASIX ) 40 MG tablet Take 1.5 tablets (60 mg total) by mouth daily. (Patient taking differently: Take 20 mg by mouth 2 (two) times daily.) 135 tablet 0   lipase/protease/amylase (CREON ) 36000 UNITS CPEP capsule Take 2 capsules (72,000 Units total) by mouth 3  (three) times daily with meals. And take 1 capsule with snacks 300 capsule 3   metFORMIN (GLUCOPHAGE-XR) 500 MG 24 hr tablet Take 500 mg by mouth every morning.     metoprolol  succinate (TOPROL -XL) 50 MG 24 hr tablet Take 1.5 tablets (75 mg total) by mouth daily. 135 tablet 0   rosuvastatin (CRESTOR) 10 MG tablet Take 10 mg by mouth daily.     sertraline (ZOLOFT) 50 MG tablet Take 50 mg by mouth daily.     spironolactone  (ALDACTONE ) 100 MG tablet Take 1 tablet (100 mg total) by mouth daily. (Patient taking differently: Take 50 mg by mouth daily.) 90 tablet 0   thiamine  (VITAMIN B1) 100 MG tablet Take 100 mg by mouth daily.     No current facility-administered medications for this visit.   Allergies  Allergen Reactions   Gadolinium Derivatives Hives and Itching    Patient received Gadavist  contrast, a few minutes after receiving contrast patient got hives and itching on neck, chest and left shoulder. Pt treated with Benadryl .      Review of Systems: All systems reviewed and negative except where noted in HPI.     Physical Exam: BP 130/74   Pulse 78   Ht 6' (1.829 m)   Wt 178 lb (80.7 kg)   BMI 24.14 kg/m  Constitutional: Pleasant,well-developed, male in no acute distress. Neurological: Alert and oriented to person place and time. Psychiatric: Normal mood and affect. Behavior is normal.   ASSESSMENT: 43 y.o. male here for assessment of the following  1. Alcohol-induced acute pancreatitis without infection or necrosis   2. Pseudocyst of pancreas   3. Iron deficiency anemia, unspecified iron deficiency anemia type    As above, he has completely stopped drinking alcohol and smoking tobacco, over the past several months he has really improved which is excellent news.  His scans have showed interval improvement of the pseudocyst and as of last imaging has resolved.  He does have some pancreatic atrophy from this.  EUS showed no concerning changes for malignancy etc.  Discussed  importance of continued alcohol abstinence with him and he understands this.  He is quite motivated to remain off alcohol and doing really well.  He will continue Creon  for now, can taper down over time as he tolerates and if he does well with this could stop it.  Discussed symptoms of steatorrhea or abdominal pain and if this recurs as he tapers off can resume as needed.  We discussed if he wanted to pursue some follow-up imaging of his pancreas perhaps 6 to 9 months from his  last exam, we can discuss that after the new year based on his course.  We discussed his history of iron deficiency anemia.  EGD has been done with the EUS but he still needs the colonoscopy done.  Denies any blood in the stools or problems with his bowels.  We discussed colonoscopy, risks and benefits of the procedure and anesthesia, he wants to proceed.  Further recommendations pending results.  In the interim we will check labs today-CBC and iron studies.  If this is normal then can stop iron and follow his counts closely.  I think he can otherwise stop the folic acid  at this time given he has been off alcohol for almost a year.  He agrees, all questions answered.   PLAN: - continue Creon  PRN  - complete EtOH abstinence - may consider f/u MRCP next year - stop folic acid  - labs today - CBC and TIBC / ferritin panel - stop iron if normalized - schedule colonoscopy at the [facility] for IDA  I spent 30 minutes of time, including in depth chart review, face-to-face time with the patient, and documentation.  Marcey Naval, MD [facility]

## 2024-06-23 ENCOUNTER — Ambulatory Visit: Payer: Self-pay | Admitting: Gastroenterology

## 2024-08-06 ENCOUNTER — Encounter: Payer: Self-pay | Admitting: Gastroenterology

## 2024-08-13 ENCOUNTER — Other Ambulatory Visit: Payer: Self-pay | Admitting: Gastroenterology

## 2024-08-13 ENCOUNTER — Ambulatory Visit: Admitting: Gastroenterology

## 2024-08-13 ENCOUNTER — Encounter: Payer: Self-pay | Admitting: Gastroenterology

## 2024-08-13 VITALS — BP 121/83 | HR 62 | Temp 98.2°F | Resp 17 | Ht 72.0 in | Wt 178.0 lb

## 2024-08-13 DIAGNOSIS — D128 Benign neoplasm of rectum: Secondary | ICD-10-CM | POA: Diagnosis not present

## 2024-08-13 DIAGNOSIS — D122 Benign neoplasm of ascending colon: Secondary | ICD-10-CM | POA: Diagnosis present

## 2024-08-13 DIAGNOSIS — D509 Iron deficiency anemia, unspecified: Secondary | ICD-10-CM | POA: Diagnosis not present

## 2024-08-13 DIAGNOSIS — K648 Other hemorrhoids: Secondary | ICD-10-CM

## 2024-08-13 MED ORDER — SODIUM CHLORIDE 0.9 % IV SOLN: 500.0000 mL | Freq: Once | INTRAVENOUS | Status: DC

## 2024-08-13 NOTE — Patient Instructions (Addendum)
 -Handout on polpys and hemorrhoids provided. -await pathology results. -repeat colonoscopy for surveillance recommended. Date to be determined when pathology result become available.  -Continue present medications.   YOU HAD AN ENDOSCOPIC PROCEDURE TODAY AT THE [facility]:   Refer to the procedure report that was given to you for any specific questions about what was found during the examination.  If the procedure report does not answer your questions, please call your gastroenterologist to clarify.  If you requested that your care partner not be given the details of your procedure findings, then the procedure report has been included in a sealed envelope for you to review at your convenience later.  YOU SHOULD EXPECT: Some feelings of bloating in the abdomen. Passage of more gas than usual.  Walking can help get rid of the air that was put into your GI tract during the procedure and reduce the bloating. If you had a lower endoscopy (such as a colonoscopy or flexible sigmoidoscopy) you may notice spotting of blood in your stool or on the toilet paper. If you underwent a bowel prep for your procedure, you may not have a normal bowel movement for a few days.  Please Note:  You might notice some irritation and congestion in your nose or some drainage.  This is from the oxygen used during your procedure.  There is no need for concern and it should clear up in a day or so.  SYMPTOMS TO REPORT IMMEDIATELY:  Following lower endoscopy (colonoscopy or flexible sigmoidoscopy):  Excessive amounts of blood in the stool  Significant tenderness or worsening of abdominal pains  Swelling of the abdomen that is new, acute  Fever of 100F or higher   For urgent or emergent issues, a gastroenterologist can be reached at any hour by calling [phone]. Do not use MyChart messaging for urgent concerns.    DIET:  We do recommend a small meal at first, but then you may proceed to your regular  diet.  Drink plenty of fluids but you should avoid alcoholic beverages for 24 hours.  MEDICATIONS: Continue present medications.  FOLLOW UP: Await pathology results. Continue to trend Hbg - consideration for stopping oral iron and repeating labs in 3 months, not off all alcohol, recovered from pancreatitis.  Thank you for allowing us  to provide for your healthcare needs today.  ACTIVITY:  You should plan to take it easy for the rest of today and you should NOT DRIVE or use heavy machinery until tomorrow (because of the sedation medicines used during the test).    FOLLOW UP: Our staff will call the number listed on your records the next business day following your procedure.  We will call around 7:15- 8:00 am to check on you and address any questions or concerns that you may have regarding the information given to you following your procedure. If we do not reach you, we will leave a message.     If any biopsies were taken you will be contacted by phone or by letter within the next 1-3 weeks.  Please call us  at [phone] if you have not heard about the biopsies in 3 weeks.    SIGNATURES/CONFIDENTIALITY: You and/or your care partner have signed paperwork which will be entered into your electronic medical record.  These signatures attest to the fact that that the information above on your After Visit Summary has been reviewed and is understood.  Full responsibility of the confidentiality of this discharge information lies with you and/or  your care-partner.

## 2024-08-13 NOTE — Op Note (Signed)
 [facility] Patient Name: Juan Crawford Procedure Date: 08/13/2024 11:30 AM MRN: [id number] Endoscopist: Elspeth P. Leigh , MD, [id number] Age: 44 Referring MD:  Date of Birth: 1981/01/17 Gender: Male Account #: [id number] Procedure:                Colonoscopy Indications:              Iron deficiency anemia - negative EGD in May.                            Anemia has resolved with iron supplementation and                            EtOH cessation (history of severe pancreatitis) Medicines:                Monitored Anesthesia Care Procedure:                Pre-Anesthesia Assessment:                           - Prior to the procedure, a History and Physical                            was performed, and patient medications and                            allergies were reviewed. The patient's tolerance of                            previous anesthesia was also reviewed. The risks                            and benefits of the procedure and the sedation                            options and risks were discussed with the patient.                            All questions were answered, and informed consent                            was obtained. Prior Anticoagulants: The patient has                            taken no anticoagulant or antiplatelet agents. ASA                            Grade Assessment: III - A patient with severe                            systemic disease. After reviewing the risks and                            benefits, the patient was deemed in satisfactory  condition to undergo the procedure.                           After obtaining informed consent, the colonoscope                            was passed under direct vision. Throughout the                            procedure, the patient's blood pressure, pulse, and                            oxygen saturations were monitored continuously. The                             [id number]-HQ190L Colonoscope [id number] was introduced                            through the anus and advanced to the the terminal                            ileum, with identification of the appendiceal                            orifice and IC valve. The colonoscopy was performed                            without difficulty. The patient tolerated the                            procedure well. The quality of the bowel                            preparation was adequate. The terminal ileum,                            ileocecal valve, appendiceal orifice, and rectum                            were photographed. Scope In: 11:39:53 AM Scope Out: 11:57:45 AM Scope Withdrawal Time: 0 hours 13 minutes 55 seconds  Total Procedure Duration: 0 hours 17 minutes 52 seconds  Findings:                 The perianal and digital rectal examinations were                            normal.                           The terminal ileum appeared normal.                           A 4 to 5 mm polyp was found in the ascending colon.  The polyp was sessile. The polyp was removed with a                            cold snare. Resection and retrieval were complete.                           A 4 to 5 mm polyp was found in the proximal rectum.                            The polyp was sessile. The polyp was removed with a                            cold snare. Resection and retrieval were complete.                           Internal hemorrhoids were found during retroflexion.                           The exam was otherwise without abnormality. Complications:            No immediate complications. Estimated blood loss:                            Minimal. Estimated Blood Loss:     Estimated blood loss was minimal. Impression:               - The examined portion of the ileum was normal.                           - One 4 to 5 mm polyp in the ascending colon,                            removed with a  cold snare. Resected and retrieved.                           - One 4 to 5 mm polyp in the proximal rectum,                            removed with a cold snare. Resected and retrieved.                           - Internal hemorrhoids.                           - The examination was otherwise normal. Recommendation:           - Patient has a contact number available for                            emergencies. The signs and symptoms of potential                            delayed complications were discussed with the  patient. Return to normal activities tomorrow.                            Written discharge instructions were provided to the                            patient.                           - Resume previous diet.                           - Continue present medications.                           - Await pathology results.                           - Continue to trend Hgb - consideration for                            stopping oral iron and repeating labs in 3 months,                            now off all EtOH, recovered from pancreatitis Elspeth P. Reinhold Rickey, MD 08/13/2024 12:04:54 PM This report has been signed electronically.

## 2024-08-13 NOTE — Progress Notes (Signed)
 [facility] History and Physical   Primary Care Physician:  Valma Lannie LABOR, PA-C   Reason for Procedure:   Iron deficiency anemia  Plan:    colonoscopy     HPI: Juan Crawford is a 44 y.o. male  here for colonoscopy. He has had IDA in the setting of severe pancreatitis from EtOH. Stopped alcohol, recovered from pancreatitis. EGD done in May along with EUS without clear cause for IDA. screening / surveillance.  Anemia has resolved with iron. Colonoscopy to clear lower tract.  Patient denies any bowel symptoms at this time. No family history of colon cancer known. Otherwise feels well without any cardiopulmonary symptoms.   I have discussed risks / benefits of anesthesia and endoscopic procedure with Juan Crawford and they wish to proceed with the exams as outlined today.   The patient was provided an opportunity to ask questions and all were answered. The patient agreed with the plan.    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   Hypertension    Iron deficiency anemia    Nicotine  dependence    Pancreatic pseudocyst    Pancreatitis     Past Surgical History:  Procedure Laterality Date   CYST EXCISION Left 02/12/2019   Procedure: DEBRIDEMENT NECK WOUND AND PLACEMENT OF DRAIN;  Surgeon: Carlie Clark, MD;  Location: [facility];  Service: ENT;  Laterality: Left;   ESOPHAGOGASTRODUODENOSCOPY N/A 12/19/2023   Procedure: EGD (ESOPHAGOGASTRODUODENOSCOPY);  Surgeon: Wilhelmenia Aloha Raddle., MD;  Location: [facility];  Service: Gastroenterology;  Laterality: N/A;   EUS N/A 12/19/2023   Procedure: ULTRASOUND, UPPER GI TRACT, ENDOSCOPIC;  Surgeon: Wilhelmenia Aloha Raddle., MD;  Location: [facility];  Service: Gastroenterology;  Laterality: N/A;   FACIAL LACERATION REPAIR N/A 12/21/2018   Procedure: Facial Laceration Repair;  Surgeon: Carlie Clark, MD;  Location: [facility] OR;  Service: ENT;  Laterality: N/A;  3cm wound on  chin   FINE NEEDLE ASPIRATION  12/19/2023   Procedure: FINE NEEDLE ASPIRATION;  Surgeon: Wilhelmenia Aloha Raddle., MD;  Location: [facility];  Service: Gastroenterology;;   HAND SURGERY  2001   IR PARACENTESIS  07/29/2023   IR PARACENTESIS  08/01/2023   IR PARACENTESIS  12/12/2023   MANDIBULAR HARDWARE REMOVAL Bilateral 02/12/2019   Procedure: MANDIBULAR HARDWARE REMOVAL;  Surgeon: Carlie Clark, MD;  Location: [facility];  Service: ENT;  Laterality: Bilateral;   ORIF MANDIBULAR FRACTURE N/A 12/21/2018   Procedure: MAXILLO-MANDIBULAR FIXATION AND ORIF OF MANDIBLE FRACTURE;  Surgeon: Carlie Clark, MD;  Location: [facility] OR;  Service: ENT;  Laterality: N/A;    Prior to Admission medications  Medication Sig Start Date End Date Taking? Authorizing Provider  ACCU-CHEK GUIDE TEST test strip Use to monitor blood glucose 1 time(s) daily 07/16/24 07/16/25 Yes [provider]  Accu-Chek Softclix Lancets lancets Use to monitor blood glucose 1 time(s) daily. 07/16/24 07/16/25 Yes [provider]  Blood Glucose Monitoring Suppl (ACCU-CHEK GUIDE ME) w/Device KIT SMARTSIG:1 device Daily   Yes [provider]  lipase/protease/amylase (CREON ) 36000 UNITS CPEP capsule Take 2 capsules (72,000 Units total) by mouth 3 (three) times daily with meals. And take 1 capsule with snacks 06/05/24  Yes Shabana Armentrout, Elspeth SQUIBB, MD  metFORMIN (GLUCOPHAGE-XR) 500 MG 24 hr tablet Take 500 mg by mouth every morning.   Yes [provider]  metoprolol  succinate (TOPROL -XL) 50 MG 24 hr tablet Take 1.5 tablets (75 mg total) by mouth daily. 11/05/23  Yes   rosuvastatin (  CRESTOR) 10 MG tablet Take 10 mg by mouth daily. 03/26/24  Yes [provider]  sertraline (ZOLOFT) 50 MG tablet Take 50 mg by mouth daily. 10/10/23  Yes [provider]  spironolactone  (ALDACTONE ) 50 MG tablet Take 50 mg by mouth. 07/16/24  Yes [provider]  thiamine  (VITAMIN B1) 100 MG tablet Take 100  mg by mouth daily. 08/06/23  Yes [provider]  ferrous sulfate 325 (65 FE) MG tablet Take 325 mg by mouth daily with breakfast.    [provider]  furosemide  (LASIX ) 40 MG tablet Take 1.5 tablets (60 mg total) by mouth daily. Patient not taking: Reported on 08/13/2024 11/05/23     spironolactone  (ALDACTONE ) 100 MG tablet Take 1 tablet (100 mg total) by mouth daily. Patient not taking: Reported on 08/13/2024 11/05/23       Current Outpatient Medications  Medication Sig Dispense Refill   ACCU-CHEK GUIDE TEST test strip Use to monitor blood glucose 1 time(s) daily     Accu-Chek Softclix Lancets lancets Use to monitor blood glucose 1 time(s) daily.     Blood Glucose Monitoring Suppl (ACCU-CHEK GUIDE ME) w/Device KIT SMARTSIG:1 device Daily     lipase/protease/amylase (CREON ) 36000 UNITS CPEP capsule Take 2 capsules (72,000 Units total) by mouth 3 (three) times daily with meals. And take 1 capsule with snacks 300 capsule 3   metFORMIN (GLUCOPHAGE-XR) 500 MG 24 hr tablet Take 500 mg by mouth every morning.     metoprolol  succinate (TOPROL -XL) 50 MG 24 hr tablet Take 1.5 tablets (75 mg total) by mouth daily. 135 tablet 0   rosuvastatin (CRESTOR) 10 MG tablet Take 10 mg by mouth daily.     sertraline (ZOLOFT) 50 MG tablet Take 50 mg by mouth daily.     spironolactone  (ALDACTONE ) 50 MG tablet Take 50 mg by mouth.     thiamine  (VITAMIN B1) 100 MG tablet Take 100 mg by mouth daily.     ferrous sulfate 325 (65 FE) MG tablet Take 325 mg by mouth daily with breakfast.     furosemide  (LASIX ) 40 MG tablet Take 1.5 tablets (60 mg total) by mouth daily. (Patient not taking: Reported on 08/13/2024) 135 tablet 0   spironolactone  (ALDACTONE ) 100 MG tablet Take 1 tablet (100 mg total) by mouth daily. (Patient not taking: Reported on 08/13/2024) 90 tablet 0   Current Facility-Administered Medications  Medication Dose Route Frequency Provider Last Rate Last Admin   0.9 %  sodium chloride  infusion  500  mL Intravenous Once Stepahnie Campo, Elspeth SQUIBB, MD        Allergies as of 08/13/2024 - Review Complete 08/13/2024  Allergen Reaction Noted   Gadolinium derivatives Hives and Itching 10/02/2023    Family History  Problem Relation Age of Onset   Esophageal cancer Father        died age 25   Diabetes Neg Hx    Colon cancer Neg Hx    Inflammatory bowel disease Neg Hx    Liver disease Neg Hx    Pancreatic cancer Neg Hx    Rectal cancer Neg Hx    Stomach cancer Neg Hx     Social History   Socioeconomic History   Marital status: Single    Spouse name: Not on file   Number of children: 0   Years of education: Not on file   Highest education level: Not on file  Occupational History   Not on file  Tobacco Use   Smoking status: Every Day  Types: E-cigarettes   Smokeless tobacco: Never  Vaping Use   Vaping status: Every Day  Substance and Sexual Activity   Alcohol use: Not Currently   Drug use: Yes    Frequency: 3.0 times per week    Types: Marijuana   Sexual activity: Not Currently  Other Topics Concern   Not on file  Social History Narrative   Not on file   Social Drivers of Health   Tobacco Use: High Risk (08/13/2024)   Patient History    Smoking Tobacco Use: Every Day    Smokeless Tobacco Use: Never    Passive Exposure: Not on file  Financial Resource Strain: Low Risk (10/09/2023)   Received from [facility]   Overall Financial Resource Strain (CARDIA)    Difficulty of Paying Living Expenses: Not very hard  Food Insecurity: No Food Insecurity (10/09/2023)   Received from [facility]   Epic    Within the past 12 months, you worried that your food would run out before you got the money to buy more.: Never true    Within the past 12 months, the food you bought just didn't last and you didn't have money to get more.: Never true  Transportation Needs: No Transportation Needs (10/09/2023)   Received from [facility] - Transportation    Lack of  Transportation (Medical): No    Lack of Transportation (Non-Medical): No  Physical Activity: Unknown (10/09/2023)   Received from [facility]   Exercise Vital Sign    On average, how many days per week do you engage in moderate to strenuous exercise (like a brisk walk)?: 0 days    Minutes of Exercise per Session: Not on file  Stress: Stress Concern Present (10/09/2023)   Received from [facility] - Occupational Stress Questionnaire    Feeling of Stress : To some extent  Social Connections: Moderately Integrated (10/09/2023)   Received from [facility]   Social Network    How would you rate your social network (family, work, friends)?: Adequate participation with social networks  Intimate Partner Violence: Not At Risk (10/09/2023)   Received from [facility]   HITS    Over the last 12 months how often did your partner physically hurt you?: Never    Over the last 12 months how often did your partner insult you or talk down to you?: Never    Over the last 12 months how often did your partner threaten you with physical harm?: Never    Over the last 12 months how often did your partner scream or curse at you?: Never  Depression (PHQ2-9): Not on file  Alcohol Screen: Not on file  Housing: Low Risk (10/09/2023)   Received from [facility]    In the last 12 months, was there a time when you were not able to pay the mortgage or rent on time?: No    In the past 12 months, how many times have you moved where you were living?: 0    At any time in the past 12 months, were you homeless or living in a shelter (including now)?: No  Utilities: Not At Risk (10/09/2023)   Received from [facility] Utilities    Threatened with loss of utilities: No  Health Literacy: Not on file    Review of Systems: All other review of systems negative except as mentioned in the HPI.  Physical Exam: Vital signs  BP 136/89   Pulse 79   Temp 98.2 F  (36.8 C)   Ht 6' (1.829 m)   Wt 178 lb (80.7 kg)   SpO2 99%   BMI 24.14 kg/m   General:   Alert,  Well-developed, pleasant and cooperative in NAD Lungs:  Clear throughout to auscultation.   Heart:  Regular rate and rhythm Abdomen:  Soft, nontender and nondistended.   Neuro/Psych:  Alert and cooperative. Normal mood and affect. A and O x 3  Marcey Naval, MD [facility]

## 2024-08-13 NOTE — Progress Notes (Signed)
 Called to room to assist during endoscopic procedure.  Patient ID and intended procedure confirmed with present staff. Received instructions for my participation in the procedure from the performing physician.

## 2024-08-13 NOTE — Progress Notes (Signed)
 Pt's states no medical or surgical changes since previsit or office visit.

## 2024-08-13 NOTE — Progress Notes (Signed)
 Sedate, gd SR, tolerated procedure well, VSS, report to RN

## 2024-08-14 ENCOUNTER — Telehealth: Payer: Self-pay

## 2024-08-14 NOTE — Telephone Encounter (Signed)
" °  Follow up Call-     08/13/2024   10:29 AM  Call back number  Post procedure Call Back phone  # [phone]  Permission to leave phone message Yes     Patient questions:  Do you have a fever, pain , or abdominal swelling? No. Pain Score  0 *  Have you tolerated food without any problems? Yes.    Have you been able to return to your normal activities? Yes.    Do you have any questions about your discharge instructions: Diet   No. Medications  No. Follow up visit  No.  Do you have questions or concerns about your Care? No.  Actions: * If pain score is 4 or above: No action needed, pain <4.   "

## 2024-08-17 ENCOUNTER — Ambulatory Visit: Payer: Self-pay | Admitting: Gastroenterology

## 2024-08-17 LAB — SURGICAL PATHOLOGY
